# Patient Record
Sex: Female | Born: 1955 | Race: Asian | Hispanic: No | Marital: Married | Smoking: Never smoker
Health system: Southern US, Community
[De-identification: ages and names within clinical notes are randomized; demographics above are authoritative.]

## PROBLEM LIST (undated history)

## (undated) DIAGNOSIS — E119 Type 2 diabetes mellitus without complications: Secondary | ICD-10-CM

## (undated) DIAGNOSIS — M1990 Unspecified osteoarthritis, unspecified site: Secondary | ICD-10-CM

## (undated) DIAGNOSIS — R05 Cough: Secondary | ICD-10-CM

## (undated) DIAGNOSIS — Z860100 Personal history of colon polyps, unspecified: Secondary | ICD-10-CM

## (undated) DIAGNOSIS — I10 Essential (primary) hypertension: Secondary | ICD-10-CM

## (undated) DIAGNOSIS — H269 Unspecified cataract: Secondary | ICD-10-CM

## (undated) DIAGNOSIS — T464X5A Adverse effect of angiotensin-converting-enzyme inhibitors, initial encounter: Secondary | ICD-10-CM

## (undated) DIAGNOSIS — R058 Other specified cough: Secondary | ICD-10-CM

## (undated) DIAGNOSIS — M810 Age-related osteoporosis without current pathological fracture: Secondary | ICD-10-CM

## (undated) HISTORY — PX: BELPHAROPTOSIS REPAIR: SHX369

## (undated) HISTORY — DX: Type 2 diabetes mellitus without complications: E11.9

## (undated) HISTORY — PX: BREAST BIOPSY: SHX20

## (undated) HISTORY — DX: Personal history of colon polyps, unspecified: Z86.0100

## (undated) HISTORY — DX: Unspecified osteoarthritis, unspecified site: M19.90

## (undated) HISTORY — DX: Adverse effect of angiotensin-converting-enzyme inhibitors, initial encounter: T46.4X5A

## (undated) HISTORY — DX: Personal history of colonic polyps: Z86.010

## (undated) HISTORY — DX: Other specified cough: R05.8

## (undated) HISTORY — DX: Cough: R05

## (undated) HISTORY — DX: Unspecified cataract: H26.9

## (undated) HISTORY — PX: POLYPECTOMY: SHX149

## (undated) HISTORY — DX: Essential (primary) hypertension: I10

## (undated) HISTORY — PX: COLONOSCOPY: SHX174

---

## 2001-04-14 ENCOUNTER — Encounter: Admission: RE | Admit: 2001-04-14 | Discharge: 2001-04-14 | Payer: Self-pay | Admitting: *Deleted

## 2001-04-14 ENCOUNTER — Encounter: Payer: Self-pay | Admitting: *Deleted

## 2003-09-20 ENCOUNTER — Encounter: Admission: RE | Admit: 2003-09-20 | Discharge: 2003-09-20 | Payer: Self-pay | Admitting: Endocrinology

## 2003-10-25 ENCOUNTER — Other Ambulatory Visit: Admission: RE | Admit: 2003-10-25 | Discharge: 2003-10-25 | Payer: Self-pay | Admitting: Endocrinology

## 2004-03-06 ENCOUNTER — Ambulatory Visit: Payer: Self-pay | Admitting: Endocrinology

## 2004-03-27 ENCOUNTER — Ambulatory Visit (HOSPITAL_COMMUNITY): Admission: RE | Admit: 2004-03-27 | Discharge: 2004-03-27 | Payer: Self-pay | Admitting: Endocrinology

## 2004-05-01 ENCOUNTER — Ambulatory Visit (HOSPITAL_COMMUNITY): Admission: RE | Admit: 2004-05-01 | Discharge: 2004-05-01 | Payer: Self-pay | Admitting: Endocrinology

## 2004-07-14 ENCOUNTER — Ambulatory Visit: Payer: Self-pay | Admitting: Internal Medicine

## 2004-07-24 ENCOUNTER — Ambulatory Visit (HOSPITAL_COMMUNITY): Admission: RE | Admit: 2004-07-24 | Discharge: 2004-07-24 | Payer: Self-pay | Admitting: Internal Medicine

## 2004-07-31 ENCOUNTER — Encounter: Admission: RE | Admit: 2004-07-31 | Discharge: 2004-07-31 | Payer: Self-pay | Admitting: Internal Medicine

## 2004-08-07 ENCOUNTER — Ambulatory Visit (HOSPITAL_COMMUNITY): Admission: RE | Admit: 2004-08-07 | Discharge: 2004-08-07 | Payer: Self-pay | Admitting: Internal Medicine

## 2005-05-09 ENCOUNTER — Ambulatory Visit: Payer: Self-pay | Admitting: Endocrinology

## 2005-05-14 ENCOUNTER — Ambulatory Visit: Payer: Self-pay | Admitting: Endocrinology

## 2005-05-14 ENCOUNTER — Encounter: Payer: Self-pay | Admitting: Endocrinology

## 2005-05-14 ENCOUNTER — Other Ambulatory Visit: Admission: RE | Admit: 2005-05-14 | Discharge: 2005-05-14 | Payer: Self-pay | Admitting: Endocrinology

## 2005-06-04 ENCOUNTER — Encounter: Admission: RE | Admit: 2005-06-04 | Discharge: 2005-06-04 | Payer: Self-pay | Admitting: Endocrinology

## 2005-06-25 ENCOUNTER — Ambulatory Visit: Payer: Self-pay | Admitting: Endocrinology

## 2006-01-30 ENCOUNTER — Ambulatory Visit: Payer: Self-pay | Admitting: Endocrinology

## 2006-02-04 ENCOUNTER — Ambulatory Visit: Payer: Self-pay | Admitting: Pulmonary Disease

## 2006-03-11 ENCOUNTER — Ambulatory Visit: Payer: Self-pay | Admitting: Internal Medicine

## 2006-03-25 ENCOUNTER — Ambulatory Visit: Payer: Self-pay | Admitting: Internal Medicine

## 2006-04-16 HISTORY — PX: ESOPHAGOGASTRODUODENOSCOPY: SHX1529

## 2006-04-30 ENCOUNTER — Encounter (INDEPENDENT_AMBULATORY_CARE_PROVIDER_SITE_OTHER): Payer: Self-pay | Admitting: *Deleted

## 2006-04-30 ENCOUNTER — Ambulatory Visit: Payer: Self-pay | Admitting: Internal Medicine

## 2006-06-01 ENCOUNTER — Ambulatory Visit: Payer: Self-pay | Admitting: Family Medicine

## 2006-08-01 ENCOUNTER — Ambulatory Visit: Payer: Self-pay | Admitting: Internal Medicine

## 2006-09-15 DIAGNOSIS — H269 Unspecified cataract: Secondary | ICD-10-CM | POA: Insufficient documentation

## 2006-09-15 DIAGNOSIS — E119 Type 2 diabetes mellitus without complications: Secondary | ICD-10-CM

## 2006-09-15 HISTORY — DX: Type 2 diabetes mellitus without complications: E11.9

## 2006-09-19 ENCOUNTER — Ambulatory Visit: Payer: Self-pay | Admitting: Internal Medicine

## 2006-09-19 LAB — CONVERTED CEMR LAB
ALT: 21 units/L (ref 0–40)
AST: 22 units/L (ref 0–37)
Alkaline Phosphatase: 61 units/L (ref 39–117)
BUN: 21 mg/dL (ref 6–23)
Basophils Absolute: 0.1 10*3/uL (ref 0.0–0.1)
CO2: 27 meq/L (ref 19–32)
Calcium: 9.7 mg/dL (ref 8.4–10.5)
Creatinine, Ser: 0.8 mg/dL (ref 0.4–1.2)
Eosinophils Absolute: 0.3 10*3/uL (ref 0.0–0.6)
Eosinophils Relative: 3.1 % (ref 0.0–5.0)
HCT: 34.3 % — ABNORMAL LOW (ref 36.0–46.0)
Hemoglobin: 11.1 g/dL — ABNORMAL LOW (ref 12.0–15.0)
Hgb A1c MFr Bld: 7 % — ABNORMAL HIGH (ref 4.6–6.0)
Monocytes Absolute: 0.6 10*3/uL (ref 0.2–0.7)
Monocytes Relative: 7.6 % (ref 3.0–11.0)
Neutro Abs: 5.3 10*3/uL (ref 1.4–7.7)
Potassium: 3.8 meq/L (ref 3.5–5.1)
RDW: 19.7 % — ABNORMAL HIGH (ref 11.5–14.6)
Sed Rate: 25 mm/hr (ref 0–25)
TSH: 0.79 microintl units/mL (ref 0.35–5.50)
WBC: 8.3 10*3/uL (ref 4.5–10.5)

## 2006-10-11 ENCOUNTER — Ambulatory Visit: Payer: Self-pay | Admitting: Internal Medicine

## 2006-11-08 HISTORY — PX: CARDIOVASCULAR STRESS TEST: SHX262

## 2006-11-27 ENCOUNTER — Encounter: Payer: Self-pay | Admitting: Endocrinology

## 2006-11-27 DIAGNOSIS — Z860100 Personal history of colon polyps, unspecified: Secondary | ICD-10-CM | POA: Insufficient documentation

## 2006-11-27 DIAGNOSIS — E119 Type 2 diabetes mellitus without complications: Secondary | ICD-10-CM | POA: Insufficient documentation

## 2006-11-27 DIAGNOSIS — I10 Essential (primary) hypertension: Secondary | ICD-10-CM

## 2006-11-27 HISTORY — DX: Type 2 diabetes mellitus without complications: E11.9

## 2006-11-27 HISTORY — DX: Essential (primary) hypertension: I10

## 2006-12-17 ENCOUNTER — Ambulatory Visit: Payer: Self-pay | Admitting: Internal Medicine

## 2006-12-17 LAB — CONVERTED CEMR LAB
ALT: 16 units/L (ref 0–35)
CO2: 29 meq/L (ref 19–32)
Chloride: 107 meq/L (ref 96–112)
Cholesterol: 149 mg/dL (ref 0–200)
Direct LDL: 64.9 mg/dL
GFR calc Af Amer: 136 mL/min
GFR calc non Af Amer: 112 mL/min
Microalb, Ur: 2 mg/dL — ABNORMAL HIGH (ref 0.0–1.9)
Total CHOL/HDL Ratio: 3.7

## 2006-12-21 ENCOUNTER — Encounter: Admission: RE | Admit: 2006-12-21 | Discharge: 2006-12-21 | Payer: Self-pay | Admitting: Internal Medicine

## 2007-02-01 IMAGING — CT CT NECK W/ CM
1 of 4 series · 6 of 14 positions shown, 8 images · IV contrast (75ML OMNI 300)
Comparison: 09/20/03.

CLINICAL DATA: 48-year-old female, with posterior neck pain and history of lipoma.  
CT NECK WITH CONTRAST:
TECHNIQUE: 75 cc Omnipaque 300 contrast was administered intravenously with 2.5 mm multidetector axial imaging performed through the neck.

[Series 2: neck w/ · axial · 0.39mm/px · z∈[-227,-42]mm · 6 of 104 slices shown, 8 images]
[im 15/104  soft-tissue]
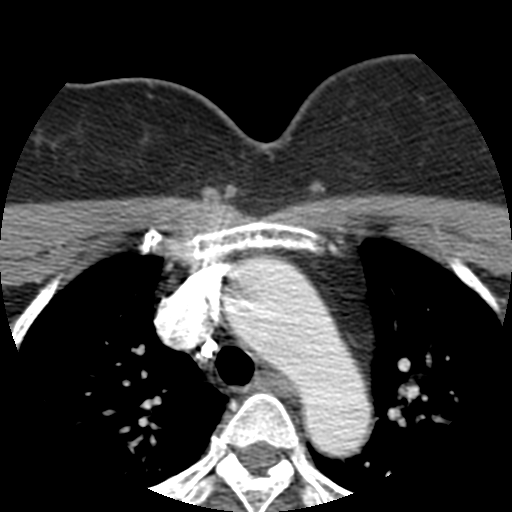
[im 15/104  bone]
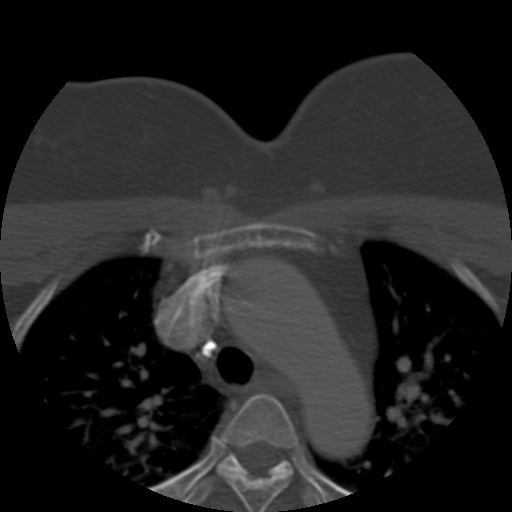
[im 30/104  bone]
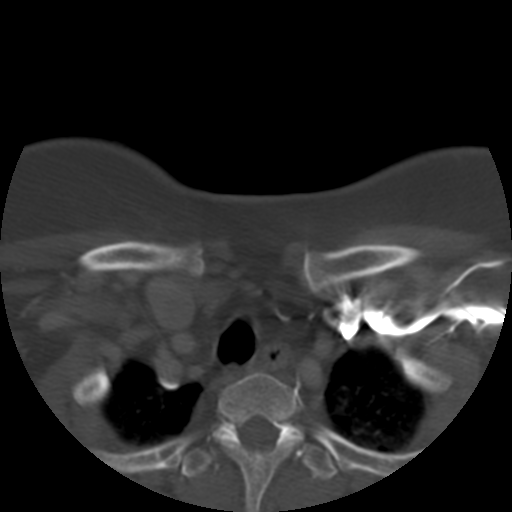
[im 45/104  bone]
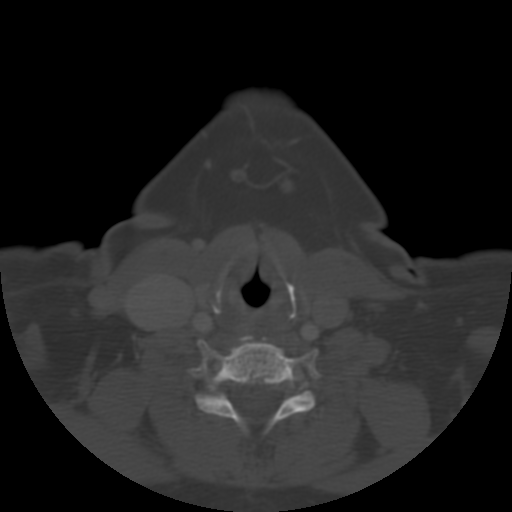
[im 59/104  bone]
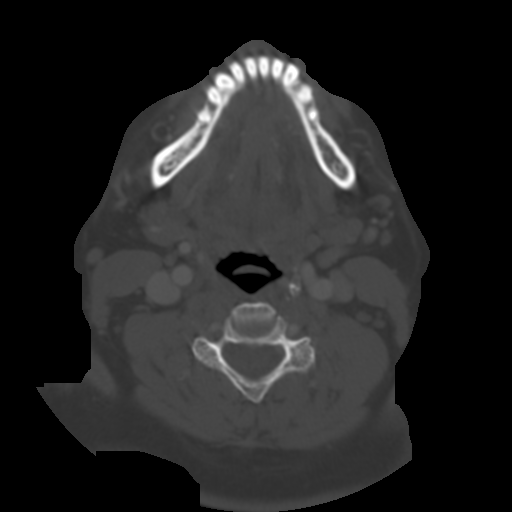
[im 74/104  soft-tissue]
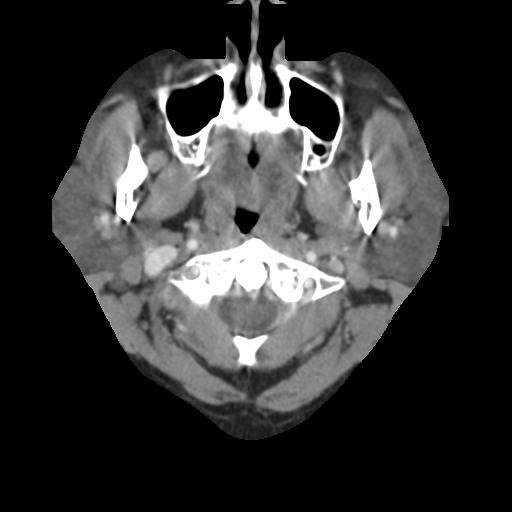
[im 74/104  bone]
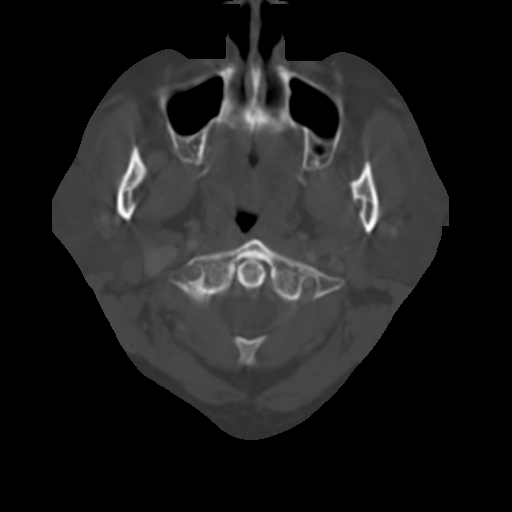
[im 89/104  bone]
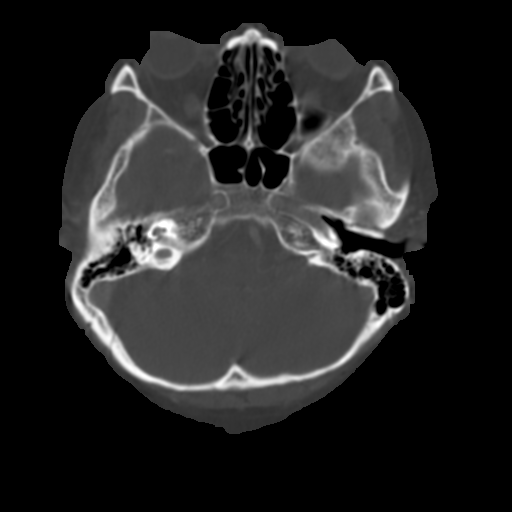

[6 of 14 positions shown; findings below may reference images not displayed]

FINDINGS: No discrete soft tissue mass is appreciated in the posterior neck.  There is asymmetry along the left posterior neck in the midcervical region involving the subcutaneous adipose layer but this appears to be related to the patient?s positioning and obesity.  There is mild prominence of the tonsillar tissue but symmetric.  The pharyngeal mucosal fat planes are preserved.  Salivary glands are symmetric.  Pharyngeal mucosal space is within normal limits.  No fluid collection or abscess.  No acute inflammation.  Scattered prominent cervical lymph nodes are present but no pathologically enlarged lymph nodes.  The carotid arteries are patent.  The jugular veins are also patent.  The thyroid gland is normal in size.  Imaging of the upper chest demonstrates calcified subcarinal lymph nodes consistent with granulomatous disease.  The lung apices demonstrate mild hypoventilatory changes diffusely.
IMPRESSION: No acute finding or discrete soft tissue mass.  No lymphadenopathy.  Stable exam.

## 2007-02-18 ENCOUNTER — Ambulatory Visit: Payer: Self-pay | Admitting: Internal Medicine

## 2007-02-18 DIAGNOSIS — E781 Pure hyperglyceridemia: Secondary | ICD-10-CM

## 2007-02-18 HISTORY — DX: Pure hyperglyceridemia: E78.1

## 2007-02-19 ENCOUNTER — Encounter (INDEPENDENT_AMBULATORY_CARE_PROVIDER_SITE_OTHER): Payer: Self-pay | Admitting: *Deleted

## 2007-03-20 ENCOUNTER — Encounter: Payer: Self-pay | Admitting: Internal Medicine

## 2007-04-01 ENCOUNTER — Encounter: Admission: RE | Admit: 2007-04-01 | Discharge: 2007-04-01 | Payer: Self-pay | Admitting: Internal Medicine

## 2007-04-21 LAB — CONVERTED CEMR LAB: Pap Smear: NORMAL

## 2007-04-29 ENCOUNTER — Ambulatory Visit: Payer: Self-pay | Admitting: Internal Medicine

## 2007-04-29 DIAGNOSIS — K5289 Other specified noninfective gastroenteritis and colitis: Secondary | ICD-10-CM | POA: Insufficient documentation

## 2007-05-07 LAB — CONVERTED CEMR LAB
Alkaline Phosphatase: 53 units/L (ref 39–117)
Basophils Absolute: 0 10*3/uL (ref 0.0–0.1)
Basophils Relative: 0 % (ref 0.0–1.0)
Bilirubin, Direct: 0.2 mg/dL (ref 0.0–0.3)
CO2: 30 meq/L (ref 19–32)
Chloride: 101 meq/L (ref 96–112)
Glucose, Bld: 109 mg/dL — ABNORMAL HIGH (ref 70–99)
Lipase: 20 units/L (ref 11.0–59.0)
MCV: 88.2 fL (ref 78.0–100.0)
Monocytes Relative: 9.4 % (ref 3.0–11.0)
Neutro Abs: 5.8 10*3/uL (ref 1.4–7.7)
Neutrophils Relative %: 71.1 % (ref 43.0–77.0)
Platelets: 231 10*3/uL (ref 150–400)
Potassium: 3.6 meq/L (ref 3.5–5.1)
RDW: 17.6 % — ABNORMAL HIGH (ref 11.5–14.6)
Sodium: 141 meq/L (ref 135–145)
WBC: 8.2 10*3/uL (ref 4.5–10.5)

## 2007-08-19 ENCOUNTER — Ambulatory Visit: Payer: Self-pay | Admitting: Internal Medicine

## 2007-10-27 ENCOUNTER — Ambulatory Visit: Payer: Self-pay | Admitting: Internal Medicine

## 2007-10-27 LAB — CONVERTED CEMR LAB
BUN: 12 mg/dL (ref 6–23)
Cholesterol: 168 mg/dL (ref 0–200)
Creatinine, Ser: 0.7 mg/dL (ref 0.4–1.2)
GFR calc Af Amer: 113 mL/min
GFR calc non Af Amer: 94 mL/min
Glucose, Bld: 91 mg/dL (ref 70–99)
Microalb Creat Ratio: 15.6 mg/g (ref 0.0–30.0)
Microalb, Ur: 2.1 mg/dL — ABNORMAL HIGH (ref 0.0–1.9)
Triglycerides: 169 mg/dL — ABNORMAL HIGH (ref 0–149)

## 2007-10-28 ENCOUNTER — Encounter: Payer: Self-pay | Admitting: Internal Medicine

## 2007-10-31 ENCOUNTER — Telehealth: Payer: Self-pay | Admitting: Internal Medicine

## 2007-12-02 ENCOUNTER — Ambulatory Visit: Payer: Self-pay | Admitting: Internal Medicine

## 2008-02-02 ENCOUNTER — Ambulatory Visit: Payer: Self-pay | Admitting: Internal Medicine

## 2008-02-02 LAB — CONVERTED CEMR LAB
ALT: 20 units/L (ref 0–35)
AST: 22 units/L (ref 0–37)
CO2: 26 meq/L (ref 19–32)
CRP, High Sensitivity: 1 (ref 0.00–5.00)
Calcium: 9.6 mg/dL (ref 8.4–10.5)
Chloride: 106 meq/L (ref 96–112)
Cholesterol: 175 mg/dL (ref 0–200)
GFR calc Af Amer: 136 mL/min
HDL: 55.1 mg/dL (ref >39.0)
LDL Cholesterol: 90 mg/dL (ref 0–99)
Potassium: 3.5 meq/L (ref 3.5–5.1)
Total CHOL/HDL Ratio: 3.2
VLDL: 30 mg/dL (ref 0–40)

## 2008-02-03 ENCOUNTER — Encounter: Payer: Self-pay | Admitting: Internal Medicine

## 2008-02-19 ENCOUNTER — Ambulatory Visit (HOSPITAL_BASED_OUTPATIENT_CLINIC_OR_DEPARTMENT_OTHER): Admission: RE | Admit: 2008-02-19 | Discharge: 2008-02-19 | Payer: Self-pay | Admitting: Internal Medicine

## 2008-02-19 ENCOUNTER — Ambulatory Visit: Payer: Self-pay | Admitting: Internal Medicine

## 2008-02-19 DIAGNOSIS — J189 Pneumonia, unspecified organism: Secondary | ICD-10-CM | POA: Insufficient documentation

## 2008-03-02 ENCOUNTER — Ambulatory Visit: Payer: Self-pay | Admitting: Internal Medicine

## 2008-04-29 ENCOUNTER — Ambulatory Visit: Payer: Self-pay | Admitting: Internal Medicine

## 2008-04-29 DIAGNOSIS — J00 Acute nasopharyngitis [common cold]: Secondary | ICD-10-CM | POA: Insufficient documentation

## 2008-06-07 ENCOUNTER — Telehealth: Payer: Self-pay | Admitting: Internal Medicine

## 2008-06-07 ENCOUNTER — Encounter: Payer: Self-pay | Admitting: Internal Medicine

## 2008-06-07 ENCOUNTER — Ambulatory Visit: Payer: Self-pay | Admitting: Diagnostic Radiology

## 2008-06-07 ENCOUNTER — Ambulatory Visit (HOSPITAL_BASED_OUTPATIENT_CLINIC_OR_DEPARTMENT_OTHER): Admission: RE | Admit: 2008-06-07 | Discharge: 2008-06-07 | Payer: Self-pay | Admitting: Internal Medicine

## 2008-10-11 ENCOUNTER — Ambulatory Visit: Payer: Self-pay | Admitting: Internal Medicine

## 2008-10-11 DIAGNOSIS — R109 Unspecified abdominal pain: Secondary | ICD-10-CM | POA: Insufficient documentation

## 2008-10-11 LAB — CONVERTED CEMR LAB
ALT: 15 units/L (ref 0–35)
AST: 16 units/L (ref 0–37)
Albumin: 4.7 g/dL (ref 3.5–5.2)
Alkaline Phosphatase: 54 units/L (ref 39–117)
Bilirubin, Direct: 0.1 mg/dL (ref 0.0–0.3)
Chloride: 107 meq/L (ref 96–112)
Microalb, Ur: 2.14 mg/dL — ABNORMAL HIGH (ref 0.00–1.89)

## 2008-10-12 ENCOUNTER — Encounter: Payer: Self-pay | Admitting: Internal Medicine

## 2008-10-19 ENCOUNTER — Ambulatory Visit: Payer: Self-pay | Admitting: Diagnostic Radiology

## 2008-10-19 ENCOUNTER — Telehealth: Payer: Self-pay | Admitting: Internal Medicine

## 2008-10-19 ENCOUNTER — Ambulatory Visit (HOSPITAL_BASED_OUTPATIENT_CLINIC_OR_DEPARTMENT_OTHER): Admission: RE | Admit: 2008-10-19 | Discharge: 2008-10-19 | Payer: Self-pay | Admitting: Internal Medicine

## 2008-12-06 ENCOUNTER — Ambulatory Visit: Payer: Self-pay | Admitting: Internal Medicine

## 2008-12-06 DIAGNOSIS — M779 Enthesopathy, unspecified: Secondary | ICD-10-CM | POA: Insufficient documentation

## 2008-12-06 DIAGNOSIS — L0291 Cutaneous abscess, unspecified: Secondary | ICD-10-CM | POA: Insufficient documentation

## 2008-12-06 DIAGNOSIS — L0390 Cellulitis, unspecified: Secondary | ICD-10-CM

## 2008-12-07 ENCOUNTER — Encounter: Payer: Self-pay | Admitting: Internal Medicine

## 2008-12-07 LAB — CONVERTED CEMR LAB
BUN: 23 mg/dL (ref 6–23)
CO2: 23 meq/L (ref 19–32)
Calcium: 9.5 mg/dL (ref 8.4–10.5)
Creatinine, Ser: 1.01 mg/dL (ref 0.40–1.20)
Glucose, Bld: 85 mg/dL (ref 70–99)
Hgb A1c MFr Bld: 5.8 % (ref 4.6–6.1)
Potassium: 3.8 meq/L (ref 3.5–5.3)

## 2008-12-13 ENCOUNTER — Ambulatory Visit (HOSPITAL_BASED_OUTPATIENT_CLINIC_OR_DEPARTMENT_OTHER): Admission: RE | Admit: 2008-12-13 | Discharge: 2008-12-13 | Payer: Self-pay | Admitting: Internal Medicine

## 2008-12-13 ENCOUNTER — Ambulatory Visit: Payer: Self-pay | Admitting: Diagnostic Radiology

## 2008-12-13 ENCOUNTER — Telehealth: Payer: Self-pay | Admitting: Internal Medicine

## 2009-01-03 ENCOUNTER — Ambulatory Visit: Payer: Self-pay | Admitting: Internal Medicine

## 2009-01-03 DIAGNOSIS — J309 Allergic rhinitis, unspecified: Secondary | ICD-10-CM

## 2009-01-03 HISTORY — DX: Allergic rhinitis, unspecified: J30.9

## 2009-01-17 ENCOUNTER — Telehealth: Payer: Self-pay | Admitting: Internal Medicine

## 2009-01-17 DIAGNOSIS — L259 Unspecified contact dermatitis, unspecified cause: Secondary | ICD-10-CM | POA: Insufficient documentation

## 2009-02-28 ENCOUNTER — Encounter: Payer: Self-pay | Admitting: Internal Medicine

## 2009-03-01 ENCOUNTER — Telehealth: Payer: Self-pay | Admitting: Internal Medicine

## 2009-03-28 ENCOUNTER — Encounter: Admission: RE | Admit: 2009-03-28 | Discharge: 2009-03-28 | Payer: Self-pay | Admitting: Internal Medicine

## 2009-07-07 ENCOUNTER — Ambulatory Visit: Payer: Self-pay | Admitting: Internal Medicine

## 2009-07-07 DIAGNOSIS — J328 Other chronic sinusitis: Secondary | ICD-10-CM

## 2009-07-07 HISTORY — DX: Other chronic sinusitis: J32.8

## 2009-07-07 LAB — CONVERTED CEMR LAB
BUN: 16 mg/dL (ref 6–23)
CO2: 25 meq/L (ref 19–32)
Calcium: 9.5 mg/dL (ref 8.4–10.5)
Creatinine, Ser: 0.97 mg/dL (ref 0.40–1.20)
Glucose, Bld: 115 mg/dL — ABNORMAL HIGH (ref 70–99)
Microalb, Ur: 2.89 mg/dL — ABNORMAL HIGH (ref 0.00–1.89)
Sodium: 145 meq/L (ref 135–145)

## 2009-07-08 ENCOUNTER — Encounter: Payer: Self-pay | Admitting: Internal Medicine

## 2009-10-11 ENCOUNTER — Ambulatory Visit (HOSPITAL_BASED_OUTPATIENT_CLINIC_OR_DEPARTMENT_OTHER): Admission: RE | Admit: 2009-10-11 | Discharge: 2009-10-11 | Payer: Self-pay | Admitting: Internal Medicine

## 2009-10-11 ENCOUNTER — Ambulatory Visit: Payer: Self-pay | Admitting: Diagnostic Radiology

## 2009-10-11 ENCOUNTER — Ambulatory Visit: Payer: Self-pay | Admitting: Internal Medicine

## 2009-10-11 DIAGNOSIS — J209 Acute bronchitis, unspecified: Secondary | ICD-10-CM | POA: Insufficient documentation

## 2009-10-11 LAB — CONVERTED CEMR LAB: Rapid Strep: NEGATIVE

## 2010-05-07 ENCOUNTER — Encounter: Payer: Self-pay | Admitting: Internal Medicine

## 2010-05-16 NOTE — Assessment & Plan Note (Signed)
Summary: fever, hot & cold/dt   Vital Signs:  Patient profile:   55 year old female Height:      62 inches Weight:      145.50 pounds BMI:     26.71 O2 Sat:      97 % on Room air Temp:     100.3 degrees F oral Pulse rate:   76 / minute Pulse rhythm:   regular Resp:     16 per minute BP sitting:   122 / 84  (right arm) Cuff size:   regular  Vitals Entered By: Glendell Docker CMA (October 11, 2009 1:18 PM)  O2 Flow:  Room air CC: Rm 3- Fever Comments medications reviewed,request refill on test strips and lancets, and Diovan, regimen completed for Cefuroxime, c/o chest congestion , throat irritation, and nasal drainage, non productive cough sudden onset yesterday. Took Dayquil and nightquil with relief   Primary Care Provider:  DThomos Lemons DO  CC:  Rm 3- Fever.  History of Present Illness: 55 y/o asian female flu like symptoms came home yest - got chills,  fever 101 sore throat,  runny nose onset 24-48 hrs   Allergies: 1)  ! Ace Inhibitors  Past History:  Past Medical History: Colonic polyps, hx of Diabetes mellitus, type II (09/2006) Hypertension   Cough due to Ace inhibitors         Past Surgical History: Caesarean section (1980 & 1982) EGD (04/30/2006)  Stress Cardiolite (11/08/2003)             Social History: Married Never Smoked  Alcohol use-no           Physical Exam  General:  alert, well-developed, and well-nourished.   Head:  normocephalic and atraumatic.   Ears:  R ear normal and L ear normal.   Mouth:  pharyngeal erythema.   Lungs:  slightly coarse breath sounds at basesnormal respiratory effort and no wheezes.   Heart:  normal rate, regular rhythm, and no gallop.     Impression & Recommendations:  Problem # 1:  ACUTE BRONCHITIS (ICD-466.0)  The following medications were removed from the medication list:    Cefuroxime Axetil 500 Mg Tabs (Cefuroxime axetil) ..... One by mouth two times a day Her updated medication list for this problem  includes:    Azithromycin 250 Mg Tabs (Azithromycin) .Marland Kitchen... 2 tabs on day one, then one by mouth once daily x 4 days  Orders: T-2 View CXR, Same Day (71020.5TC)  Take antibiotics and other medications as directed. Encouraged to push clear liquids, get enough rest, and take acetaminophen as needed. To be seen in 5-7 days if no improvement, sooner if worse.  Complete Medication List: 1)  Diovan Hct 80-12.5 Mg Tabs (Valsartan-hydrochlorothiazide) .... One by mouth qd 2)  Onetouch Test Strp (Glucose blood) .... Use to rest blood sugar two times a day 3)  Azelastine Hcl 137 Mcg/spray Soln (Azelastine hcl) .... 2 sprays each nostril once daily 4)  [facility] Loratadine 10 Mg Tabs (Loratadine) .... One by mouth once daily 5)  Nasonex 50 Mcg/act Susp (Mometasone furoate) .... 2 sprays each nostril once daily 6)  Azithromycin 250 Mg Tabs (Azithromycin) .... 2 tabs on day one, then one by mouth once daily x 4 days  Other Orders: Rapid Strep (81191)  Patient Instructions: 1)  Call our office if your symptoms do not  improve or gets worse. Prescriptions: DIOVAN HCT 80-12.5 MG TABS (VALSARTAN-HYDROCHLOROTHIAZIDE) one by mouth qd  #90 x 3  Entered and Authorized by:   D. Thomos Lemons DO   Signed by:   D. Thomos Lemons DO on 10/11/2009   Method used:   Faxed to ...       [facility])       [address] [address]       [place], [place]  [address]       Ph: [phone]       Fax: [phone]   RxID:   [id number] ONETOUCH TEST  STRP (GLUCOSE BLOOD) use to rest blood sugar two times a day  #100 x 3   Entered and Authorized by:   D. Thomos Lemons DO   Signed by:   D. Thomos Lemons DO on 10/11/2009   Method used:   Faxed to ...       [facility])       [address] [address]       , [place]  [address]       Ph: [phone]       Fax: [phone]   RxID:   [id number] AZITHROMYCIN 250 MG TABS (AZITHROMYCIN) 2 tabs on day one, then one by mouth once daily x 4 days  #6 x 0   Entered and  Authorized by:   D. Thomos Lemons DO   Signed by:   D. Thomos Lemons DO on 10/11/2009   Method used:   Electronically to        [id number] (retail)       [address]       [place], [place]  [address]       Ph: [phone] or [phone]       Fax: [phone]   RxID:   [id number]   Laboratory Results    Other Tests  Rapid Strep: negative  Kit Test Internal QC: Positive   (Normal Range: Negative)

## 2010-05-16 NOTE — Letter (Signed)
   [facility] [address] [place], [place]  [address]  [place] Phone: [phone]      July 08, 2009   [facility] [address] [place], [place] [address]  RE:  LAB RESULTS  Dear  Ms. Pupo,  The following is an interpretation of your most recent lab tests.  Please take note of any instructions provided or changes to medications that have resulted from your lab work.  ELECTROLYTES:  Good - no changes needed  KIDNEY FUNCTION TESTS:  Good - no changes needed    DIABETIC STUDIES:  Good - no changes needed Blood Glucose: 115   HgbA1C: 6.4   Microalbumin/Creatinine Ratio: 18.8     Please continue to avoid sweets and limit your carbohydrate intake to 30 grams per meal.  Daily walking program (30 minutes) can also help control your diabetes.    Please call my office for follow up appointment in 6 months.     Sincerely Yours,    Dr. Thomos Lemons

## 2010-05-16 NOTE — Assessment & Plan Note (Signed)
Summary: COUGH AND CONGESTION/[facility]   Vital Signs:  Patient profile:   55 year old female Height:      62 inches Weight:      146.25 pounds BMI:     26.85 O2 Sat:      98 % on Room air Temp:     97.4 degrees F oral Pulse rate:   79 / minute Pulse rhythm:   regular Resp:     20 per minute BP sitting:   122 / 72  (right arm) Cuff size:   regular  Vitals Entered By: Glendell Docker CMA (July 07, 2009 2:09 PM)  O2 Flow:  Room air CC: Rm 3-sinus congestion, URI symptoms   Primary Care Provider:  Dondra Spry DO  CC:  Rm 3-sinus congestion and URI symptoms.  History of Present Illness:  URI Symptoms      This is a 55 year old woman who presents with URI symptoms.  The patient reports nasal congestion, purulent nasal discharge, and dry cough.  The patient denies fever and low-grade fever (<100.5 degrees).  The patient also reports itchy throat.  onset of symptoms - 3 wks  Htn - stable  DM II - some wt gain.  fair diet control  Allergies: 1)  ! Ace Inhibitors  Past History:  Past Medical History: Colonic polyps, hx of Diabetes mellitus, type II (09/2006) Hypertension   Cough due to Ace inhibitors        Past Surgical History: Caesarean section (1980 & 1982) EGD (04/30/2006)  Stress Cardiolite (11/08/2003)         Social History: Married Never Smoked  Alcohol use-no          Review of Systems  The patient denies fever and chest pain.    Physical Exam  General:  alert, well-developed, and well-nourished.   Ears:  R ear normal and L ear normal.   Nose:  nasal dischargemucosal pallor and mucosal edema.   Mouth:  pharyngeal erythema.   Neck:  supple and no masses.   Lungs:  normal respiratory effort, normal breath sounds, no crackles, and no wheezes.   Heart:  normal rate, regular rhythm, and no gallop.   Extremities:  No lower extremity edema    Impression & Recommendations:  Problem # 1:  RHINOSINUSITIS, CHRONIC (ICD-473.8) 3 wks of nasal congestion with  purulent nasal discharge.  use abx and nose sprays as directed.  Call our office if your symptoms do not  improve or gets worse.  Her updated medication list for this problem includes:    Azelastine Hcl 137 Mcg/spray Soln (Azelastine hcl) .Marland Kitchen... 2 sprays each nostril once daily    Cefuroxime Axetil 500 Mg Tabs (Cefuroxime axetil) ..... One by mouth two times a day    Nasonex 50 Mcg/act Susp (Mometasone furoate) .Marland Kitchen... 2 sprays each nostril once daily  Problem # 2:  HYPERTENSION (ICD-401.9) well controlled.  Maintain current medication regimen.  Her updated medication list for this problem includes:    Diovan Hct 80-12.5 Mg Tabs (Valsartan-hydrochlorothiazide) ..... One by mouth qd  Orders: T-Basic Metabolic Panel [id number])  BP today: 122/72 Prior BP: 140/80 (01/03/2009)  Labs Reviewed: K+: 3.8 (12/07/2008) Creat: : 1.01 (12/07/2008)   Chol: 175 (02/02/2008)   HDL: 55.1 (02/02/2008)   LDL: 90 (02/02/2008)   TG: 150 (02/02/2008)  Problem # 3:  DIABETES MELLITUS, TYPE II (ICD-250.00) Monitor A1c Her updated medication list for this problem includes:    Diovan Hct 80-12.5 Mg Tabs (Valsartan-hydrochlorothiazide) .Marland KitchenMarland KitchenMarland KitchenMarland Kitchen  One by mouth qd  Orders: T- Hemoglobin A1C ([id number]) T-Urine Microalbumin w/creat. ratio [phone])  Complete Medication List: 1)  Diovan Hct 80-12.5 Mg Tabs (Valsartan-hydrochlorothiazide) .... One by mouth qd 2)  Onetouch Test Strp (Glucose blood) .... Use to rest blood sugar two times a day 3)  Azelastine Hcl 137 Mcg/spray Soln (Azelastine hcl) .... 2 sprays each nostril once daily 4)  Cefuroxime Axetil 500 Mg Tabs (Cefuroxime axetil) .... One by mouth two times a day 5)  [facility] Loratadine 10 Mg Tabs (Loratadine) .... One by mouth once daily 6)  Nasonex 50 Mcg/act Susp (Mometasone furoate) .... 2 sprays each nostril once daily  Patient Instructions: 1)  Call our office if your symptoms do not  improve or gets worse. 2)  Please schedule a follow-up  appointment in 6 months. Prescriptions: NASONEX 50 MCG/ACT SUSP (MOMETASONE FUROATE) 2 sprays each nostril once daily  #1 x 3   Entered and Authorized by:   D. Thomos Lemons DO   Signed by:   D. Thomos Lemons DO on 07/07/2009   Method used:   Electronically to        [id number] (retail)       [address]       [place], [place]  [address]       Ph: [phone] or [phone]       Fax: [phone]   RxID:   [id number] [facility] LORATADINE 10 MG TABS (LORATADINE) one by mouth once daily  #30 x 11   Entered and Authorized by:   D. Thomos Lemons DO   Signed by:   D. Thomos Lemons DO on 07/07/2009   Method used:   Electronically to        [id number] (retail)       [address]       [place], [place]  [address]       Ph: [phone] or [phone]       Fax: [phone]   RxID:   [id number] CEFUROXIME AXETIL 500 MG TABS (CEFUROXIME AXETIL) one by mouth two times a day  #20 x 0   Entered and Authorized by:   D. Thomos Lemons DO   Signed by:   D. Thomos Lemons DO on 07/07/2009   Method used:   Electronically to        [id number] (retail)       [address]       [place], [place]  [address]       Ph: [phone] or [phone]       Fax: [phone]   RxID:   [id number]   Current Allergies (reviewed today): ! ACE INHIBITORS

## 2010-05-25 ENCOUNTER — Telehealth: Payer: Self-pay | Admitting: Internal Medicine

## 2010-06-01 NOTE — Progress Notes (Signed)
Summary: Diovan Refill [facility]  Phone Note Call from Patient Call back at [phone]   Caller: Katherine Kelly Call For: Katherine Spry DO Reason for Call: Refill Medication Summary of Call: patients husband called and left voice message stating patient needs a [facility]ill [facility].  Initial call taken by: Glendell Docker CMA,  May 25, 2010 4:08 PM  Follow-up for Phone Call        Rx for Diovan faxed to [facility] at [phone]. Call placed to patients husband  at [phone] he has been informed rx faxed to [facility] Follow-up by: Glendell Docker CMA,  May 26, 2010 9:02 AM    Prescriptions: DIOVAN HCT 80-12.5 MG TABS (VALSARTAN-HYDROCHLOROTHIAZIDE) one by mouth qd  #90 x 1   Entered and Authorized by:   D. Thomos Lemons DO   Signed by:   D. Thomos Lemons DO on 05/25/2010   Method used:   Print then Give to Patient   RxID:   [id number]

## 2010-12-21 ENCOUNTER — Encounter: Payer: Self-pay | Admitting: Internal Medicine

## 2010-12-22 ENCOUNTER — Ambulatory Visit (INDEPENDENT_AMBULATORY_CARE_PROVIDER_SITE_OTHER): Payer: BC Managed Care – PPO | Admitting: Internal Medicine

## 2010-12-22 ENCOUNTER — Encounter: Payer: Self-pay | Admitting: Internal Medicine

## 2010-12-22 DIAGNOSIS — E119 Type 2 diabetes mellitus without complications: Secondary | ICD-10-CM

## 2010-12-22 DIAGNOSIS — Z0000 Encounter for general adult medical examination without abnormal findings: Secondary | ICD-10-CM

## 2010-12-22 DIAGNOSIS — Z1239 Encounter for other screening for malignant neoplasm of breast: Secondary | ICD-10-CM

## 2010-12-22 DIAGNOSIS — Z23 Encounter for immunization: Secondary | ICD-10-CM

## 2010-12-22 HISTORY — DX: Encounter for general adult medical examination without abnormal findings: Z00.00

## 2010-12-22 LAB — CBC
HCT: 44.9 % (ref 36.0–46.0)
Hemoglobin: 14.6 g/dL (ref 12.0–15.0)
MCV: 100.2 fL — ABNORMAL HIGH (ref 78.0–100.0)
RBC: 4.48 MIL/uL (ref 3.87–5.11)
WBC: 8.7 10*3/uL (ref 4.0–10.5)

## 2010-12-22 LAB — BASIC METABOLIC PANEL
BUN: 16 mg/dL (ref 6–23)
CO2: 27 mEq/L (ref 19–32)
Calcium: 10.4 mg/dL (ref 8.4–10.5)
Chloride: 104 mEq/L (ref 96–112)
Glucose, Bld: 104 mg/dL — ABNORMAL HIGH (ref 70–99)
Potassium: 4.7 mEq/L (ref 3.5–5.3)

## 2010-12-22 LAB — HEPATIC FUNCTION PANEL
ALT: 18 U/L (ref 0–35)
AST: 17 U/L (ref 0–37)
Albumin: 4.7 g/dL (ref 3.5–5.2)

## 2010-12-22 LAB — LIPID PANEL
Cholesterol: 223 mg/dL — ABNORMAL HIGH (ref 0–200)
HDL: 52 mg/dL (ref >39)
Total CHOL/HDL Ratio: 4.3 Ratio

## 2010-12-22 LAB — TSH: TSH: 2.322 u[IU]/mL (ref 0.350–4.500)

## 2010-12-22 NOTE — Assessment & Plan Note (Signed)
Nl exam. tdap given. Schedule screening mammogram. Obtain cpe labs. Schedule diabetic eye exam. Resume claritin prn for rhinitis.

## 2010-12-22 NOTE — Progress Notes (Signed)
  Subjective:    Patient ID: Katherine Kelly, female    DOB: 1955-04-21, 55 y.o.   MRN: [id number]  HPI Pt presents to clinic for annual exam. H/o DM not requiring medication currently. Due for diabetic eye exam, tetanus and screening mammogram. States pap smear 12/2009 and nl. Notes mild intermittent throat irritation. May have mild nasal drainage as well. No exacerbating or alleviating factors. No other complaints.  Past Medical History  Diagnosis Date  . History of colonic polyps   . Diabetes mellitus, type 2 09/2006  . Hypertension   . ACE-inhibitor cough    Past Surgical History  Procedure Date  . Cesarean section 1980 & 1982  . Esophagogastroduodenoscopy 04/2006  . Cardiovascular stress test 11/08/2006    reports that she has never smoked. She has never used smokeless tobacco. She reports that she does not drink alcohol or use illicit drugs. family history is not on file. Allergies  Allergen Reactions  . Ace Inhibitors        Review of Systems see hpi     Objective:   Physical Exam  Nursing note and vitals reviewed. Constitutional: She appears well-developed and well-nourished. No distress.  HENT:  Head: Normocephalic and atraumatic.  Right Ear: Tympanic membrane, external ear and ear canal normal.  Left Ear: Tympanic membrane, external ear and ear canal normal.  Nose: Nose normal.  Mouth/Throat: Oropharynx is clear and moist. No oropharyngeal exudate.       Mild R>L nasal mucosal swelling. Op with mild clear drainage.  Eyes: Conjunctivae and EOM are normal. Pupils are equal, round, and reactive to light. Right eye exhibits no discharge. Left eye exhibits no discharge. No scleral icterus.  Neck: Neck supple. Carotid bruit is not present. No thyromegaly present.  Cardiovascular: Normal rate, regular rhythm, normal heart sounds and intact distal pulses.  Exam reveals no gallop and no friction rub.   No murmur heard. Pulmonary/Chest: Effort normal and breath sounds normal. No  respiratory distress. She has no wheezes. She has no rales.  Abdominal: Soft. Normal appearance and bowel sounds are normal. She exhibits no distension and no mass. There is no hepatosplenomegaly. There is no tenderness. There is no rebound and no guarding.  Lymphadenopathy:    She has no cervical adenopathy.  Neurological: She is alert.  Skin: Skin is warm and dry. She is not diaphoretic.  Diabetic foot exam: +2 dp pulses bilaterally. No diabetic wounds, ulcerations or significant callousing. Sensation grossly intact.        Assessment & Plan:

## 2010-12-29 ENCOUNTER — Other Ambulatory Visit: Payer: Self-pay | Admitting: Internal Medicine

## 2010-12-29 DIAGNOSIS — E781 Pure hyperglyceridemia: Secondary | ICD-10-CM

## 2010-12-29 MED ORDER — FENOFIBRATE 145 MG PO TABS: 145.0000 mg | ORAL_TABLET | Freq: Every day | ORAL | Status: DC

## 2011-01-11 ENCOUNTER — Inpatient Hospital Stay (HOSPITAL_BASED_OUTPATIENT_CLINIC_OR_DEPARTMENT_OTHER): Admission: RE | Admit: 2011-01-11 | Payer: BC Managed Care – PPO | Source: Ambulatory Visit

## 2011-01-17 ENCOUNTER — Ambulatory Visit (HOSPITAL_BASED_OUTPATIENT_CLINIC_OR_DEPARTMENT_OTHER)
Admission: RE | Admit: 2011-01-17 | Discharge: 2011-01-17 | Disposition: A | Discharge status: Home / Self Care | Payer: BC Managed Care – PPO | Source: Ambulatory Visit | Attending: Internal Medicine | Admitting: Internal Medicine

## 2011-01-17 ENCOUNTER — Telehealth: Payer: Self-pay | Admitting: *Deleted

## 2011-01-17 DIAGNOSIS — Z1239 Encounter for other screening for malignant neoplasm of breast: Secondary | ICD-10-CM

## 2011-01-17 DIAGNOSIS — E781 Pure hyperglyceridemia: Secondary | ICD-10-CM

## 2011-01-17 DIAGNOSIS — Z1231 Encounter for screening mammogram for malignant neoplasm of breast: Secondary | ICD-10-CM | POA: Insufficient documentation

## 2011-01-17 MED ORDER — FENOFIBRATE 145 MG PO TABS: 145.0000 mg | ORAL_TABLET | Freq: Every day | ORAL | Status: DC

## 2011-01-17 NOTE — Telephone Encounter (Signed)
Patients husband called and left voice message stating insurance will not cover brand Tricor and request generic substitute be sent to pharmacy.  Call placed to patient at [phone], no answer. A detailed voice message was left informing patient of formulary change to generic. Message left advising to call back if any problems.

## 2011-01-24 ENCOUNTER — Encounter: Payer: Self-pay | Admitting: Internal Medicine

## 2011-01-24 ENCOUNTER — Ambulatory Visit (INDEPENDENT_AMBULATORY_CARE_PROVIDER_SITE_OTHER): Payer: BC Managed Care – PPO | Admitting: Internal Medicine

## 2011-01-24 DIAGNOSIS — H5789 Other specified disorders of eye and adnexa: Secondary | ICD-10-CM | POA: Insufficient documentation

## 2011-01-24 DIAGNOSIS — E781 Pure hyperglyceridemia: Secondary | ICD-10-CM

## 2011-01-24 DIAGNOSIS — H579 Unspecified disorder of eye and adnexa: Secondary | ICD-10-CM

## 2011-01-24 MED ORDER — FENOFIBRATE 160 MG PO TABS: 160.0000 mg | ORAL_TABLET | Freq: Every day | ORAL | Status: DC

## 2011-01-24 MED ORDER — VALSARTAN-HYDROCHLOROTHIAZIDE 80-12.5 MG PO TABS: 1.0000 | ORAL_TABLET | Freq: Every day | ORAL | Status: DC

## 2011-01-24 NOTE — Progress Notes (Signed)
  Subjective:    Patient ID: Katherine Kelly, female    DOB: Sep 12, 1955, 55 y.o.   MRN: [id number]  HPI Pt presents to clinic for evaluation of left eye irritation. Yesterday while removing makeup from her face accidentally rubbed piece of cotton against left eye. Flushed the eye at that time. Has noticed mild irritation and increased lacrimation but no change in visual acuity. No alleviating or exacerbating factors. Also h/o hyperlipidemia and recent prescribed fenofibrate but 145mg  tablet was too expensive. Has not yet attempted medication due to cost. No other complaints.   Past Medical History  Diagnosis Date  . History of colonic polyps   . Diabetes mellitus, type 2 09/2006  . Hypertension   . ACE-inhibitor cough    Past Surgical History  Procedure Date  . Cesarean section 1980 & 1982  . Esophagogastroduodenoscopy 04/2006  . Cardiovascular stress test 11/08/2006    reports that she has never smoked. She has never used smokeless tobacco. She reports that she does not drink alcohol or use illicit drugs. family history is not on file. Allergies  Allergen Reactions  . Ace Inhibitors        Review of Systems  Eyes: Positive for pain. Negative for photophobia, discharge, redness, itching and visual disturbance.   see hpi     Objective:   Physical Exam  Nursing note and vitals reviewed. Constitutional: She appears well-developed and well-nourished. No distress.  HENT:  Head: Normocephalic and atraumatic.  Right Ear: External ear normal.  Left Ear: External ear normal.  Eyes: EOM and lids are normal. Pupils are equal, round, and reactive to light. No foreign bodies found. Right eye exhibits no discharge and no exudate. No foreign body present in the right eye. Left eye exhibits no discharge and no exudate. No foreign body present in the left eye. Right conjunctiva is not injected. Right conjunctiva has no hemorrhage. Left conjunctiva is injected. Left conjunctiva has no hemorrhage. No  scleral icterus.  Skin: She is not diaphoretic.          Assessment & Plan:

## 2011-01-24 NOTE — Assessment & Plan Note (Signed)
Hx and exam suggest mild irritation due to foreign body. Recommend observation. If sx's do not improve or the next several days or worsen recommend optho consult.

## 2011-01-24 NOTE — Assessment & Plan Note (Signed)
Prescription reprinted for fenofibrate 160mg  qd to be priced at local pharmacy.

## 2011-04-22 IMAGING — US US ABDOMEN COMPLETE
1 series · 14 of 25 positions shown · non-contrast
Comparison: None

CLINICAL DATA: Epigastric pain for 2-3 months.

COMPLETE ABDOMINAL ULTRASOUND

[Series 1: us abdomen complete · 0.32mm/px · 14 of 66 slices shown]
[im 1/66]
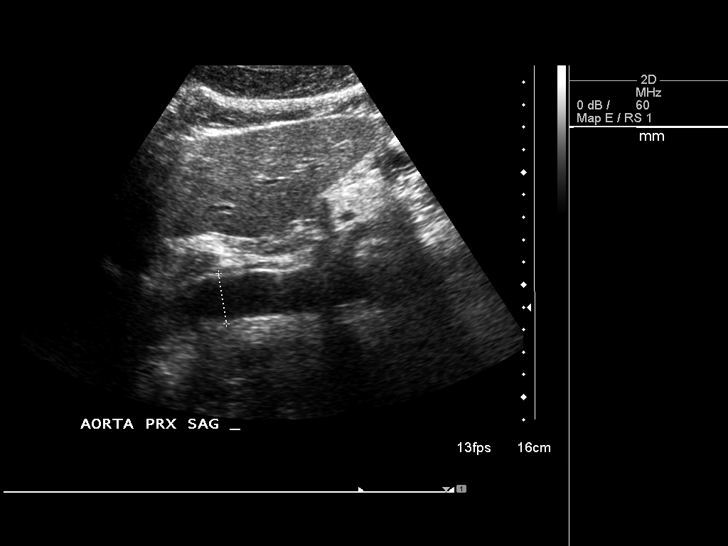
[im 6/66]
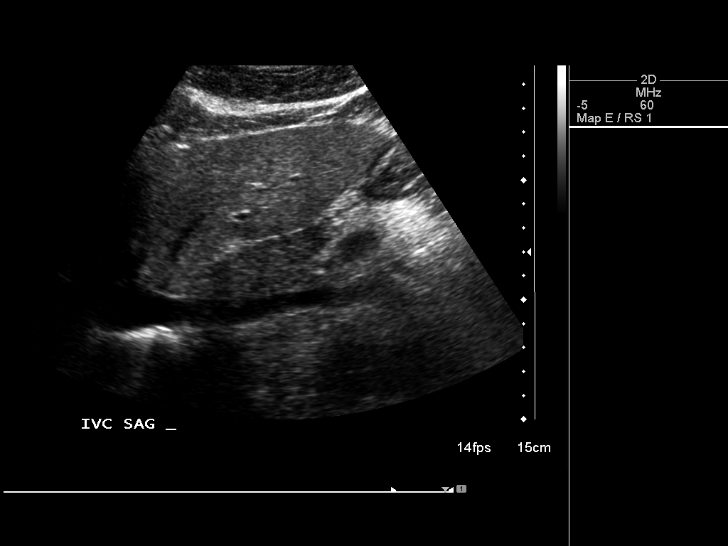
[im 11/66]
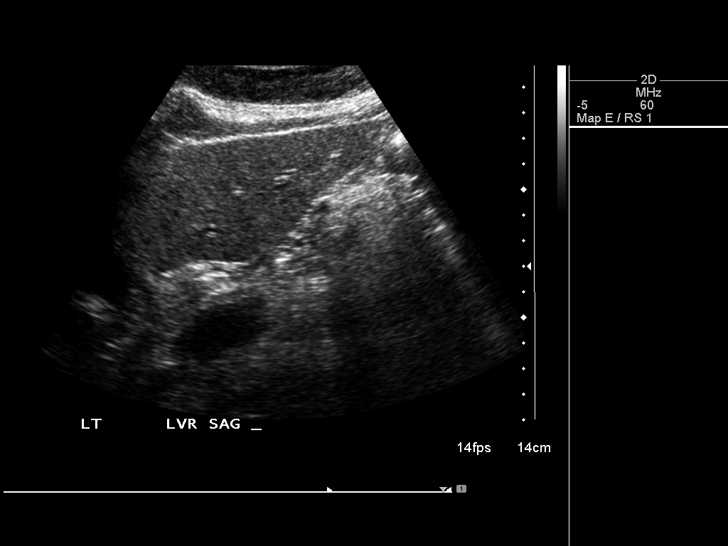
[im 17/66]
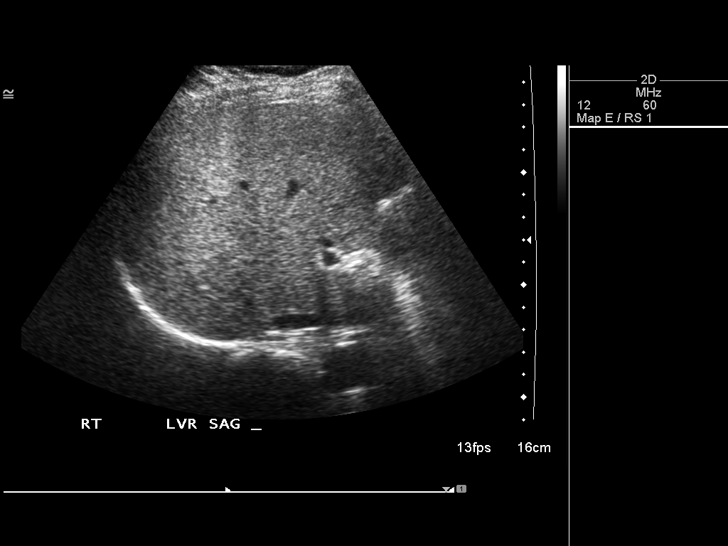
[im 22/66]
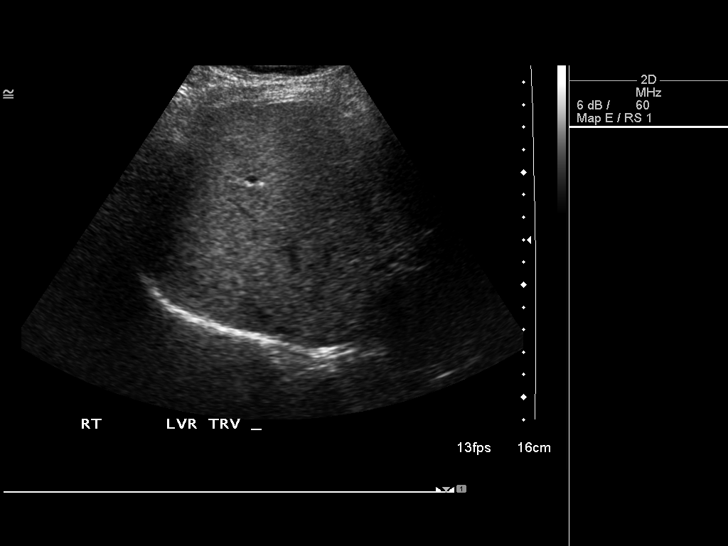
[im 25/66]
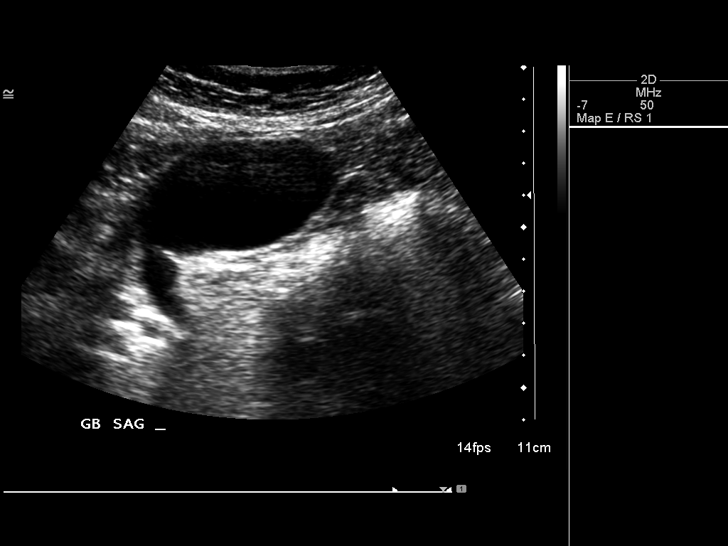
[im 30/66]
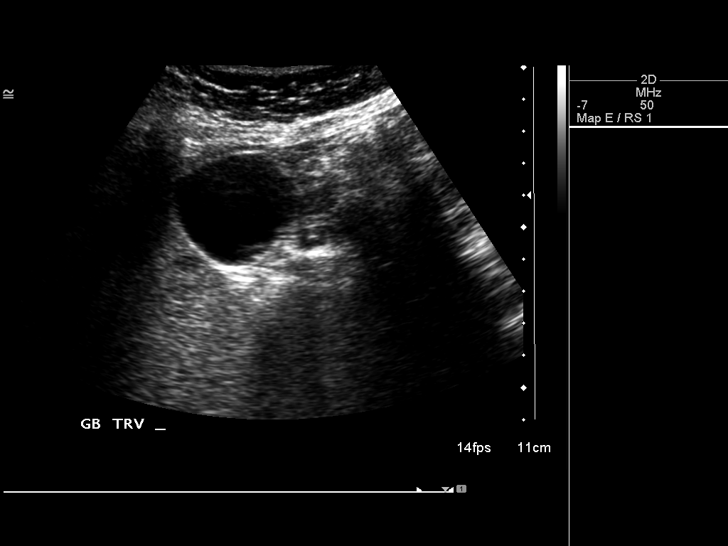
[im 36/66]
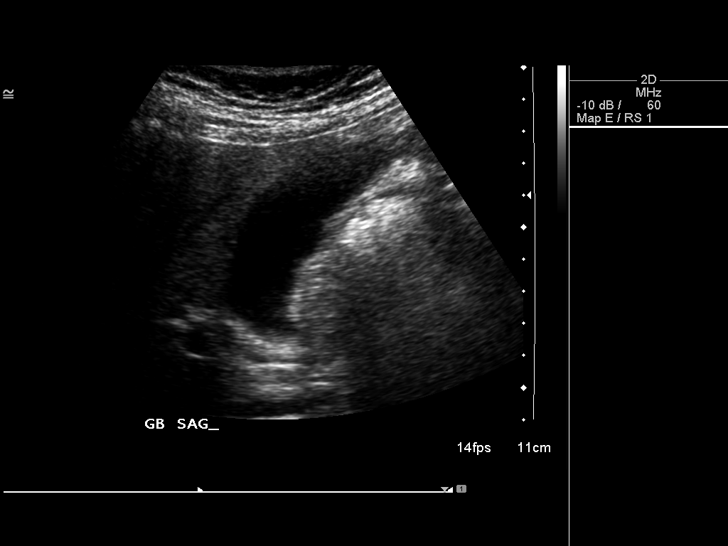
[im 41/66]
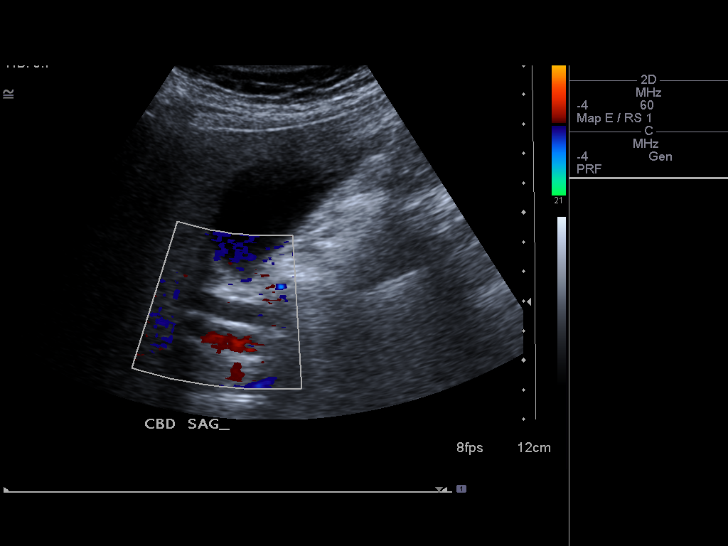
[im 44/66]
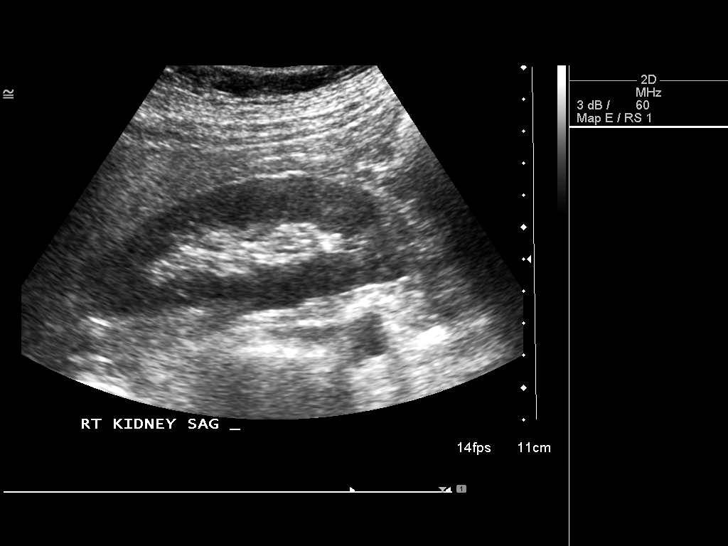
[im 49/66]
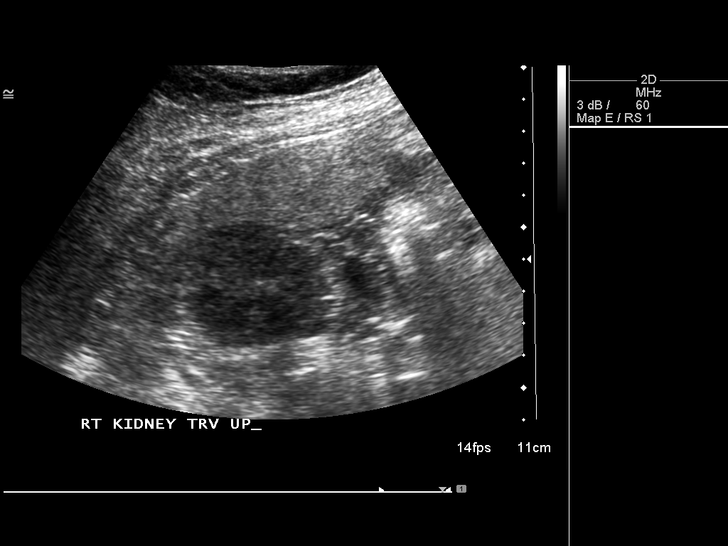
[im 55/66]
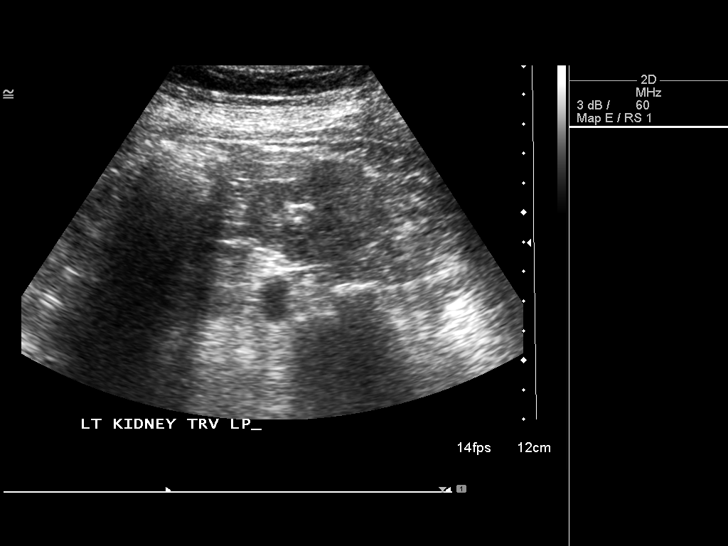
[im 60/66]
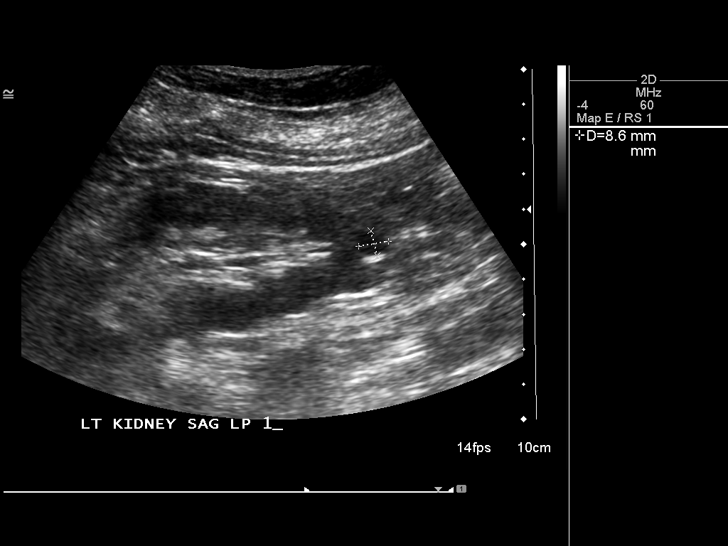
[im 66/66]
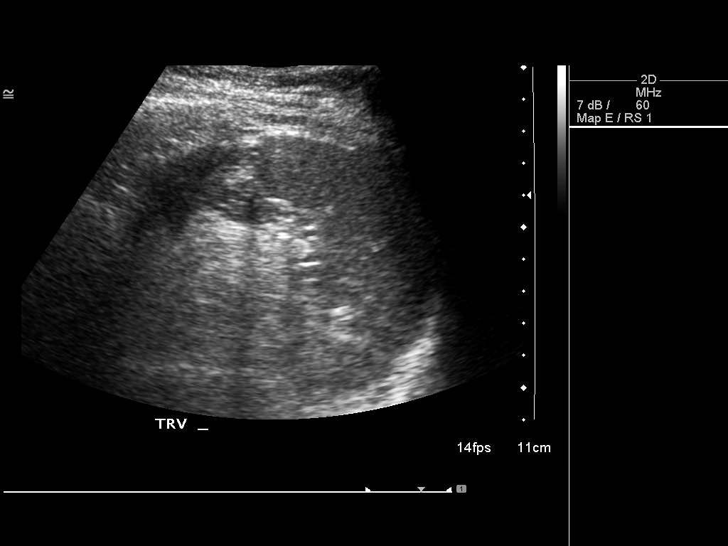

[14 of 25 positions shown; findings below may reference images not displayed]

FINDINGS: Gallbladder:  The gallbladder is moderately well seen and no
gallstones are noted.  There is no pain over the gallbladder upon
compression.

Common bile duct:  The common bile duct is normal measuring 5 mm in
diameter.

Liver:  The liver is echogenic consistent with fatty infiltration.
No ductal dilatation is seen.

IVC:  The IVC is partially obscured by bowel gas.

Pancreas:  The pancreas is echogenic suggesting fatty infiltration.

Spleen:  Spleen is normal in size.

Right Kidney:  No hydronephrosis is noted and the right kidney
measures 10.6 cm sagittally.

Left Kidney:  No hydronephrosis.  The left kidney measures 10.5 cm.
There is a small cyst in the lower pole of 9 mm in diameter.

Abdominal aorta:  The aorta is not well seen due to overlying bowel
gas.
IMPRESSION: 1.  No gallstones.  No ductal dilatation.
2.  Probable fatty infiltration of the liver.
3.  Some of the anatomy is obscured by overlying bowel gas.

## 2011-04-30 ENCOUNTER — Encounter: Payer: Self-pay | Admitting: Internal Medicine

## 2011-05-16 ENCOUNTER — Telehealth: Payer: Self-pay | Admitting: Internal Medicine

## 2011-05-16 MED ORDER — FENOFIBRATE 160 MG PO TABS: 160.0000 mg | ORAL_TABLET | Freq: Every day | ORAL | Status: DC

## 2011-05-16 NOTE — Telephone Encounter (Signed)
Patients husband states that patient is out of fenofibrate. Please send refill to [facility] [address]

## 2011-05-16 NOTE — Telephone Encounter (Signed)
Rx refill sent to pharmacy. 

## 2011-06-20 ENCOUNTER — Telehealth: Payer: Self-pay | Admitting: Internal Medicine

## 2011-06-20 ENCOUNTER — Encounter: Payer: Self-pay | Admitting: Internal Medicine

## 2011-06-20 ENCOUNTER — Ambulatory Visit (INDEPENDENT_AMBULATORY_CARE_PROVIDER_SITE_OTHER): Payer: BC Managed Care – PPO | Admitting: Internal Medicine

## 2011-06-20 ENCOUNTER — Other Ambulatory Visit: Payer: Self-pay | Admitting: *Deleted

## 2011-06-20 VITALS — BP 104/80 | HR 65 | Temp 97.6°F | Resp 16 | Ht 62.0 in | Wt 145.0 lb

## 2011-06-20 DIAGNOSIS — E785 Hyperlipidemia, unspecified: Secondary | ICD-10-CM

## 2011-06-20 DIAGNOSIS — I10 Essential (primary) hypertension: Secondary | ICD-10-CM

## 2011-06-20 DIAGNOSIS — E781 Pure hyperglyceridemia: Secondary | ICD-10-CM

## 2011-06-20 DIAGNOSIS — R739 Hyperglycemia, unspecified: Secondary | ICD-10-CM

## 2011-06-20 DIAGNOSIS — R7309 Other abnormal glucose: Secondary | ICD-10-CM

## 2011-06-20 DIAGNOSIS — E119 Type 2 diabetes mellitus without complications: Secondary | ICD-10-CM

## 2011-06-20 LAB — BASIC METABOLIC PANEL
CO2: 26 mEq/L (ref 19–32)
Chloride: 107 mEq/L (ref 96–112)
Creat: 0.82 mg/dL (ref 0.50–1.10)
Glucose, Bld: 99 mg/dL (ref 70–99)
Sodium: 144 mEq/L (ref 135–145)

## 2011-06-20 MED ORDER — FENOFIBRATE 160 MG PO TABS: 160.0000 mg | ORAL_TABLET | Freq: Every day | ORAL | Status: DC

## 2011-06-20 NOTE — Patient Instructions (Signed)
Please schedule lipid/lft 272.4, chem7, a1c-hyperglycemia prior to next visit

## 2011-06-21 LAB — LIPID PANEL
Cholesterol: 142 mg/dL (ref 0–200)
LDL Cholesterol: 55 mg/dL (ref 0–99)
Total CHOL/HDL Ratio: 2.4 Ratio
Triglycerides: 135 mg/dL (ref <150)
VLDL: 27 mg/dL (ref 0–40)

## 2011-06-21 LAB — HEPATIC FUNCTION PANEL
ALT: 45 U/L — ABNORMAL HIGH (ref 0–35)
Total Protein: 7.6 g/dL (ref 6.0–8.3)

## 2011-06-21 NOTE — Assessment & Plan Note (Signed)
Normotensive and stable. Continue current regimen. Monitor bp as outpt and followup in clinic as scheduled.  

## 2011-06-21 NOTE — Assessment & Plan Note (Signed)
Obtain lipid/lft. 

## 2011-06-21 NOTE — Assessment & Plan Note (Signed)
Obtain chem7, a1c 

## 2011-06-21 NOTE — Progress Notes (Signed)
  Subjective:    Patient ID: Katherine Kelly, female    DOB: 06-26-1955, 56 y.o.   MRN: [id number]  HPI Pt presents to clinic for followup of multiple medical problems. BP reviewed as normotensive. Wt stable. Tolerating fenofibrate without side effects. Compliant with medication. No active complaints.  Past Medical History  Diagnosis Date  . History of colonic polyps   . Diabetes mellitus, type 2 09/2006  . Hypertension   . ACE-inhibitor cough    Past Surgical History  Procedure Date  . Cesarean section 1980 & 1982  . Esophagogastroduodenoscopy 04/2006  . Cardiovascular stress test 11/08/2006    reports that she has never smoked. She has never used smokeless tobacco. She reports that she does not drink alcohol or use illicit drugs. family history is not on file. Allergies  Allergen Reactions  . Ace Inhibitors       Review of Systems see hpi     Objective:   Physical Exam  Physical Exam  Nursing note and vitals reviewed. Constitutional: Appears well-developed and well-nourished. No distress.  HENT:  Head: Normocephalic and atraumatic.  Right Ear: External ear normal.  Left Ear: External ear normal.  Eyes: Conjunctivae are normal. No scleral icterus.  Neck: Neck supple. Carotid bruit is not present.  Cardiovascular: Normal rate, regular rhythm and normal heart sounds.  Exam reveals no gallop and no friction rub.   No murmur heard. Pulmonary/Chest: Effort normal and breath sounds normal. No respiratory distress. He has no wheezes. no rales.  Lymphadenopathy:    He has no cervical adenopathy.  Neurological:Alert.  Skin: Skin is warm and dry. Not diaphoretic.  Psychiatric: Has a normal mood and affect.        Assessment & Plan:

## 2011-06-21 NOTE — Telephone Encounter (Signed)
Lab orders entered for July 2013. 

## 2011-07-25 ENCOUNTER — Ambulatory Visit (INDEPENDENT_AMBULATORY_CARE_PROVIDER_SITE_OTHER): Payer: BC Managed Care – PPO | Admitting: Family

## 2011-07-25 ENCOUNTER — Encounter: Payer: Self-pay | Admitting: Family

## 2011-07-25 VITALS — BP 112/62 | HR 77 | Temp 97.4°F | Resp 16 | Wt 147.1 lb

## 2011-07-25 DIAGNOSIS — J329 Chronic sinusitis, unspecified: Secondary | ICD-10-CM

## 2011-07-25 MED ORDER — HYDROCOD POLST-CHLORPHEN POLST 10-8 MG/5ML PO LQCR: 5.0000 mL | Freq: Two times a day (BID) | ORAL | Status: DC | PRN

## 2011-07-25 MED ORDER — AMOXICILLIN-POT CLAVULANATE 875-125 MG PO TABS: 1.0000 | ORAL_TABLET | Freq: Two times a day (BID) | ORAL | Status: AC

## 2011-07-25 NOTE — Patient Instructions (Signed)

## 2011-07-25 NOTE — Progress Notes (Signed)
  Subjective:    Patient ID: Katherine Kelly, female    DOB: 06/17/1955, 56 y.o.   MRN: [id number]  HPI  Katherine Kelly is a 56 yr old female who presents today with her husband (who helps to translate), with chief complaint of cough.  Cough started 2 weeks ago and is associated with HA, and nasal drainage. Cough is worst at night.   Multiple otc preps without improvement.   Pt denies associated fever, nausea or vomitting.    Review of Systems See HPI  Past Medical History  Diagnosis Date  . History of colonic polyps   . Diabetes mellitus, type 2 09/2006  . Hypertension   . ACE-inhibitor cough     History   Social History  . Marital Status: Married    Spouse Name: N/A    Number of Children: N/A  . Years of Education: N/A   Occupational History  . Not on file.   Social History Main Topics  . Smoking status: Never Smoker   . Smokeless tobacco: Never Used  . Alcohol Use: No  . Drug Use: No  . Sexually Active: Not on file   Other Topics Concern  . Not on file   Social History Narrative  . No narrative on file    Past Surgical History  Procedure Date  . Cesarean section 1980 & 1982  . Esophagogastroduodenoscopy 04/2006  . Cardiovascular stress test 11/08/2006    No family history on file.  Allergies  Allergen Reactions  . Ace Inhibitors     Current Outpatient Prescriptions on File Prior to Visit  Medication Sig Dispense Refill  . fenofibrate 160 MG tablet Take 1 tablet (160 mg total) by mouth daily.  90 tablet  1  . valsartan-hydrochlorothiazide (DIOVAN-HCT) 80-12.5 MG per tablet Take 1 tablet by mouth daily.  90 tablet  4    BP 112/62  Pulse 77  Temp(Src) 97.4 F (36.3 C) (Oral)  Resp 16  Wt 147 lb 1.3 oz (66.715 kg)  SpO2 97%       Objective:   Physical Exam  Constitutional: She appears well-developed and well-nourished.  HENT:  Head: Normocephalic and atraumatic.  Right Ear: Tympanic membrane and ear canal normal.  Left Ear: Tympanic membrane and  ear canal normal.  Mouth/Throat: No posterior oropharyngeal edema or posterior oropharyngeal erythema.  Cardiovascular: Normal rate and regular rhythm.   No murmur heard. Pulmonary/Chest: Effort normal and breath sounds normal. No respiratory distress. She has no wheezes. She has no rales. She exhibits no tenderness.          Assessment & Plan:

## 2011-07-27 DIAGNOSIS — J329 Chronic sinusitis, unspecified: Secondary | ICD-10-CM | POA: Insufficient documentation

## 2011-07-27 NOTE — Assessment & Plan Note (Signed)
Will rx with augmentin. Add tussionex for cough.  Pt advised re: possible sleepiness with tussionex and not to drive when using.

## 2011-11-21 ENCOUNTER — Ambulatory Visit: Payer: BC Managed Care – PPO | Admitting: Internal Medicine

## 2011-11-23 ENCOUNTER — Ambulatory Visit (INDEPENDENT_AMBULATORY_CARE_PROVIDER_SITE_OTHER): Payer: BC Managed Care – PPO | Admitting: Internal Medicine

## 2011-11-23 ENCOUNTER — Encounter: Payer: Self-pay | Admitting: Internal Medicine

## 2011-11-23 VITALS — BP 114/78 | HR 67 | Temp 97.8°F | Resp 14 | Ht 62.0 in | Wt 144.8 lb

## 2011-11-23 DIAGNOSIS — I10 Essential (primary) hypertension: Secondary | ICD-10-CM

## 2011-11-23 DIAGNOSIS — E119 Type 2 diabetes mellitus without complications: Secondary | ICD-10-CM

## 2011-11-23 DIAGNOSIS — J309 Allergic rhinitis, unspecified: Secondary | ICD-10-CM

## 2011-11-23 DIAGNOSIS — E785 Hyperlipidemia, unspecified: Secondary | ICD-10-CM

## 2011-11-23 DIAGNOSIS — E781 Pure hyperglyceridemia: Secondary | ICD-10-CM

## 2011-11-23 LAB — HEPATIC FUNCTION PANEL
ALT: 20 U/L (ref 0–35)
AST: 21 U/L (ref 0–37)
Alkaline Phosphatase: 45 U/L (ref 39–117)
Indirect Bilirubin: 0.4 mg/dL (ref 0.0–0.9)
Total Protein: 7.5 g/dL (ref 6.0–8.3)

## 2011-11-23 LAB — BASIC METABOLIC PANEL
BUN: 20 mg/dL (ref 6–23)
Calcium: 9.8 mg/dL (ref 8.4–10.5)
Creat: 0.91 mg/dL (ref 0.50–1.10)

## 2011-11-23 LAB — LIPID PANEL
Cholesterol: 152 mg/dL (ref 0–200)
HDL: 52 mg/dL (ref >39)
Triglycerides: 131 mg/dL (ref <150)

## 2011-11-23 LAB — HEMOGLOBIN A1C
Hgb A1c MFr Bld: 6 % — ABNORMAL HIGH (ref <5.7)
Mean Plasma Glucose: 126 mg/dL — ABNORMAL HIGH (ref <117)

## 2011-11-23 MED ORDER — FLUTICASONE PROPIONATE 50 MCG/ACT NA SUSP: 2.0000 | Freq: Every day | NASAL | Status: DC

## 2011-11-23 MED ORDER — VALSARTAN 80 MG PO TABS: 80.0000 mg | ORAL_TABLET | Freq: Every day | ORAL | Status: DC

## 2011-11-24 NOTE — Assessment & Plan Note (Signed)
Change diovan hct to diovan. Monitor bp.

## 2011-11-24 NOTE — Assessment & Plan Note (Signed)
Attempt flonase qd. Followup if no improvement or worsening.

## 2011-11-24 NOTE — Assessment & Plan Note (Signed)
Obtain lipid/lft. 

## 2011-11-24 NOTE — Assessment & Plan Note (Signed)
Obtain chem7 and a1c 

## 2011-11-24 NOTE — Progress Notes (Signed)
  Subjective:    Patient ID: [name], female    DOB: 1956/01/30, 56 y.o.   MRN: [id number]  HPI Pt presents to clinic for followup of multiple medical problems. Notes fatigue with bp medication. bp reviewed as normotensive. C/o nasal drainage without color or recent ifxn sx's.   Past Medical History  Diagnosis Date  . History of colonic polyps   . Diabetes mellitus, type 2 09/2006  . Hypertension   . ACE-inhibitor cough    Past Surgical History  Procedure Date  . Cesarean section 1980 & 1982  . Esophagogastroduodenoscopy 04/2006  . Cardiovascular stress test 11/08/2006    reports that she has never smoked. She has never used smokeless tobacco. She reports that she does not drink alcohol or use illicit drugs. family history is not on file. Allergies  Allergen Reactions  . Ace Inhibitors       Review of Systems see hpi     Objective:   Physical Exam  Physical Exam  Nursing note and vitals reviewed. Constitutional: Appears well-developed and well-nourished. No distress.  HENT:  Head: Normocephalic and atraumatic.  Right Ear: External ear normal.  Left Ear: External ear normal.  Eyes: Conjunctivae are normal. No scleral icterus.  Neck: Neck supple. Carotid bruit is not present.  Cardiovascular: Normal rate, regular rhythm and normal heart sounds.  Exam reveals no gallop and no friction rub.   No murmur heard. Pulmonary/Chest: Effort normal and breath sounds normal. No respiratory distress. He has no wheezes. no rales.  Lymphadenopathy:    He has no cervical adenopathy.  Neurological:Alert.  Skin: Skin is warm and dry. Not diaphoretic.  Psychiatric: Has a normal mood and affect.        Assessment & Plan:

## 2012-01-17 ENCOUNTER — Encounter: Payer: Self-pay | Admitting: Internal Medicine

## 2012-03-10 ENCOUNTER — Telehealth: Payer: Self-pay | Admitting: Internal Medicine

## 2012-03-10 NOTE — Telephone Encounter (Signed)
Informed caller that medication requested was sent to [facility] on 08.09.13 #90x2; pt should have [2] two available refills on Diovan/SLS

## 2012-03-10 NOTE — Telephone Encounter (Signed)
Patient's husband called  His wife   Needs refill on   Diovan 80mg  #90  Left message Friday please call pt's husband Katherine Kelly  [phone]

## 2012-04-16 HISTORY — PX: POLYPECTOMY: SHX149

## 2012-04-16 HISTORY — PX: COLONOSCOPY: SHX174

## 2012-07-22 ENCOUNTER — Telehealth: Payer: Self-pay | Admitting: Internal Medicine

## 2012-07-22 NOTE — Telephone Encounter (Signed)
Please advise/SLS

## 2012-07-22 NOTE — Telephone Encounter (Signed)
Patients daughter called in stating that patient would like to have a mammogram and colonscopy done before 08/14/12. Her insurance wont be affective after 08/14/12

## 2012-07-22 NOTE — Telephone Encounter (Signed)
They just call the [facility] to schedule own MGM and I can order referral for colonoscopy but it is a 2 step appt so it is hard to say if this will get done. Where do they want to go. [place], [place] or [place]

## 2012-07-23 NOTE — Telephone Encounter (Signed)
Receiving error message when attempting to call back pt at given number/SLS

## 2012-07-25 ENCOUNTER — Other Ambulatory Visit: Payer: Self-pay | Admitting: Family Medicine

## 2012-07-25 ENCOUNTER — Other Ambulatory Visit: Payer: Self-pay | Admitting: Family

## 2012-07-25 DIAGNOSIS — Z1211 Encounter for screening for malignant neoplasm of colon: Secondary | ICD-10-CM

## 2012-07-25 DIAGNOSIS — Z1231 Encounter for screening mammogram for malignant neoplasm of breast: Secondary | ICD-10-CM

## 2012-07-25 NOTE — Telephone Encounter (Signed)
Referral for colonoscopy done

## 2012-07-25 NOTE — Telephone Encounter (Signed)
Notified pt's daughter and she will call to schedule mammogram and wants to proceed with colonoscopy referral. She is aware that colonoscopy may not be completed before 08/14/12.  Please place referral to GI.

## 2012-07-30 ENCOUNTER — Encounter: Payer: Self-pay | Admitting: Gastroenterology

## 2012-07-30 ENCOUNTER — Ambulatory Visit (HOSPITAL_COMMUNITY)
Admission: RE | Admit: 2012-07-30 | Discharge: 2012-07-30 | Disposition: A | Discharge status: Home / Self Care | Payer: BC Managed Care – PPO | Source: Ambulatory Visit | Attending: Family | Admitting: Family

## 2012-07-30 DIAGNOSIS — Z1231 Encounter for screening mammogram for malignant neoplasm of breast: Secondary | ICD-10-CM

## 2012-09-04 ENCOUNTER — Encounter: Payer: BC Managed Care – PPO | Admitting: Gastroenterology

## 2012-09-05 ENCOUNTER — Ambulatory Visit (AMBULATORY_SURGERY_CENTER): Payer: BC Managed Care – PPO

## 2012-09-05 ENCOUNTER — Encounter: Payer: Self-pay | Admitting: Internal Medicine

## 2012-09-05 VITALS — Ht 62.0 in | Wt 143.2 lb

## 2012-09-05 DIAGNOSIS — Z86010 Personal history of colonic polyps: Secondary | ICD-10-CM

## 2012-09-05 DIAGNOSIS — Z1211 Encounter for screening for malignant neoplasm of colon: Secondary | ICD-10-CM

## 2012-09-05 MED ORDER — MOVIPREP 100 G PO SOLR: ORAL | Status: DC

## 2012-09-05 NOTE — Progress Notes (Signed)
The patient and her husband came into the office today for the pre-visit prior to the colonoscopy with Dr Marina Goodell on 09/09/12.The husband Clydene Laming) was the interpretor and will come with the pt to her colonoscopy appt on 09/09/12 to help with paperwork. The pt did understand some [place].

## 2012-09-09 ENCOUNTER — Ambulatory Visit (AMBULATORY_SURGERY_CENTER): Payer: BC Managed Care – PPO | Admitting: Internal Medicine

## 2012-09-09 ENCOUNTER — Encounter: Payer: Self-pay | Admitting: Internal Medicine

## 2012-09-09 VITALS — BP 142/86 | HR 64 | Temp 97.5°F | Resp 14

## 2012-09-09 DIAGNOSIS — D126 Benign neoplasm of colon, unspecified: Secondary | ICD-10-CM

## 2012-09-09 DIAGNOSIS — Z86010 Personal history of colonic polyps: Secondary | ICD-10-CM

## 2012-09-09 DIAGNOSIS — Z1211 Encounter for screening for malignant neoplasm of colon: Secondary | ICD-10-CM

## 2012-09-09 MED ORDER — SODIUM CHLORIDE 0.9 % IV SOLN: 500.0000 mL | INTRAVENOUS | Status: DC

## 2012-09-09 NOTE — Progress Notes (Signed)
NO EGG OR SOY ALLERGY. EWM 

## 2012-09-09 NOTE — Progress Notes (Signed)
Called to room to assist during endoscopic procedure.  Patient ID and intended procedure confirmed with present staff. Received instructions for my participation in the procedure from the performing physician.  

## 2012-09-09 NOTE — Patient Instructions (Addendum)
YOU HAD AN ENDOSCOPIC PROCEDURE TODAY AT THE [facility]: Refer to the procedure report that was given to you for any specific questions about what was found during the examination.  If the procedure report does not answer your questions, please call your gastroenterologist to clarify.  If you requested that your care partner not be given the details of your procedure findings, then the procedure report has been included in a sealed envelope for you to review at your convenience later.  YOU SHOULD EXPECT: Some feelings of bloating in the abdomen. Passage of more gas than usual.  Walking can help get rid of the air that was put into your GI tract during the procedure and reduce the bloating. If you had a lower endoscopy (such as a colonoscopy or flexible sigmoidoscopy) you may notice spotting of blood in your stool or on the toilet paper. If you underwent a bowel prep for your procedure, then you may not have a normal bowel movement for a few days.  DIET: Your first meal following the procedure should be a light meal and then it is ok to progress to your normal diet.  A half-sandwich or bowl of soup is an example of a good first meal.  Heavy or fried foods are harder to digest and may make you feel nauseous or bloated.  Likewise meals heavy in dairy and vegetables can cause extra gas to form and this can also increase the bloating.  Drink plenty of fluids but you should avoid alcoholic beverages for 24 hours.  ACTIVITY: Your care partner should take you home directly after the procedure.  You should plan to take it easy, moving slowly for the rest of the day.  You can resume normal activity the day after the procedure however you should NOT DRIVE or use heavy machinery for 24 hours (because of the sedation medicines used during the test).    SYMPTOMS TO REPORT IMMEDIATELY: A gastroenterologist can be reached at any hour.  During normal business hours, 8:30 AM to 5:00 PM Monday through Friday,  call [phone].  After hours and on weekends, please call the GI answering service at [phone] who will take a message and have the physician on call contact you.   Following lower endoscopy (colonoscopy or flexible sigmoidoscopy):  Excessive amounts of blood in the stool  Significant tenderness or worsening of abdominal pains  Swelling of the abdomen that is new, acute  Fever of 100F or higher   FOLLOW UP: If any biopsies were taken you will be contacted by phone or by letter within the next 1-3 weeks.  Call your gastroenterologist if you have not heard about the biopsies in 3 weeks.  Our staff will call the home number listed on your records the next business day following your procedure to check on you and address any questions or concerns that you may have at that time regarding the information given to you following your procedure. This is a courtesy call and so if there is no answer at the home number and we have not heard from you through the emergency physician on call, we will assume that you have returned to your regular daily activities without incident.  SIGNATURES/CONFIDENTIALITY: You and/or your care partner have signed paperwork which will be entered into your electronic medical record.  These signatures attest to the fact that that the information above on your After Visit Summary has been reviewed and is understood.  Full responsibility of the confidentiality of   this discharge information lies with you and/or your care-partner.  Follow up colonoscopy in 5 years 

## 2012-09-09 NOTE — Op Note (Signed)
[facility] [address] [place], [address]   COLONOSCOPY PROCEDURE REPORT  PATIENT: Katherine Kelly, Katherine Kelly  MR#: [id number] BIRTHDATE: [birth year] , 56  yrs. old GENDER: Female ENDOSCOPIST: Roxy Cedar, MD REFERRED [id number] Program Recall PROCEDURE DATE:  09/09/2012 PROCEDURE:   Colonoscopy with snare polypectomy  x 1  QUALITY INDICATORS:  First Colonoscopy? No.  Specimens Taken? Yes.  ASA CLASS:   Class II INDICATIONS:Patient's personal history of adenomatous colon polyps. Index 04-2006 w/ Multiple polyps (TAs and HPP) MEDICATIONS: MAC sedation, administered by CRNA and propofol (Diprivan) 300mg  IV  DESCRIPTION OF PROCEDURE:   After the risks benefits and alternatives of the procedure were thoroughly explained, informed consent was obtained.  A digital rectal exam revealed no abnormalities of the rectum.   The LB [id number]  endoscope was introduced through the anus and advanced to the cecum, which was identified by both the appendix and ileocecal valve. No adverse events experienced.   The quality of the prep was good, using MoviPrep  The instrument was then slowly withdrawn as the colon was fully examined.      COLON FINDINGS: A diminutive polyp was found in the ascending colon. A polypectomy was performed with a cold snare.  The resection was complete and the polyp tissue was completely retrieved.   The colon mucosa was otherwise normal.  Retroflexed views revealed no abnormalities. The time to cecum=2 minutes 33 seconds.  Withdrawal time=9 minutes 0 seconds.  The scope was withdrawn and the procedure completed. COMPLICATIONS: There were no complications.  ENDOSCOPIC IMPRESSION: 1.   Diminutive polyp was found in the ascending colon; polypectomy was performed with a cold snare 2.   The colon mucosa was otherwise normal  RECOMMENDATIONS: 1. Follow up colonoscopy in 5 years   eSigned:  Roxy Cedar, MD 09/09/2012 10:46 AM  cc:  Minus Breeding, MD and The Patient   PATIENT NAME:  Samanatha, Brammer MR#: [id number]

## 2012-09-10 ENCOUNTER — Telehealth: Payer: Self-pay

## 2012-09-10 NOTE — Telephone Encounter (Signed)
  Follow up Call-  Call back number 09/09/2012  Post procedure Call Back phone  # [phone]  Permission to leave phone message Yes     Patient questions:  Do you have a fever, pain , or abdominal swelling? no Pain Score  0 *  Have you tolerated food without any problems? yes  Have you been able to return to your normal activities? yes  Do you have any questions about your discharge instructions: Diet   no Medications  no Follow up visit  no  Do you have questions or concerns about your Care? no  Actions: * If pain score is 4 or above: No action needed, pain <4.

## 2012-09-15 ENCOUNTER — Encounter: Payer: Self-pay | Admitting: Internal Medicine

## 2012-09-16 ENCOUNTER — Other Ambulatory Visit: Payer: Self-pay | Admitting: Internal Medicine

## 2012-10-23 ENCOUNTER — Other Ambulatory Visit: Payer: Self-pay

## 2012-10-31 ENCOUNTER — Telehealth: Payer: Self-pay

## 2012-10-31 NOTE — Telephone Encounter (Signed)
Pts spouse left a message stating that pt needs her medication sent to [facility].  Didn't leave which medications on vm

## 2012-11-03 MED ORDER — ONETOUCH ULTRASOFT LANCETS MISC: Status: DC

## 2012-12-03 ENCOUNTER — Ambulatory Visit (INDEPENDENT_AMBULATORY_CARE_PROVIDER_SITE_OTHER): Payer: BC Managed Care – PPO | Admitting: Physician Assistant

## 2012-12-03 ENCOUNTER — Encounter: Payer: Self-pay | Admitting: Physician Assistant

## 2012-12-03 VITALS — BP 104/76 | HR 82 | Temp 98.3°F | Resp 14 | Ht 62.0 in | Wt 143.0 lb

## 2012-12-03 DIAGNOSIS — J069 Acute upper respiratory infection, unspecified: Secondary | ICD-10-CM

## 2012-12-03 NOTE — Progress Notes (Signed)
Patient ID: Katherine Kelly, female   DOB: [birth year], 57 y.o.   MRN: [id number]   Patient presents to clinic today c/o dry cough, head congestion x 4 days.  Information was obtained from the patient and her husband.  Endorses nasal congestion, sinus pressure.  Denies sinus pain, tooth pain or ear pain.  Endorses non-productive cough.  Endorses fever of 102 at night time.  No fever throughout the day.  Denies abdominal pain, N/V/C/D, or rash.  Denies history of asthma or allergy.  Denies shortness of breath or wheezing.  Her husband has been sick with similar symptoms for the past 4 days.  They just returned from a trip to the beach.  Past Medical History  Diagnosis Date  . History of colonic polyps   . Diabetes mellitus, type 2 09/2006    no meds  . Hypertension   . ACE-inhibitor cough     Current Outpatient Prescriptions on File Prior to Visit  Medication Sig Dispense Refill  . cholecalciferol (VITAMIN D) 1000 UNITS tablet Take 1,000 Units by mouth daily.      Marland Kitchen DIOVAN 80 MG tablet TAKE 1 TABLET DAILY  90 tablet  0  . fluticasone (FLONASE) 50 MCG/ACT nasal spray Place 2 sprays into the nose as needed.      . Lancets (ONETOUCH ULTRASOFT) lancets Check BS bid  DX 250.00  200 each  0   No current facility-administered medications on file prior to visit.    Allergies  Allergen Reactions  . Ace Inhibitors     weakness    Family History  Problem Relation Age of Onset  . Colon cancer Neg Hx   . Rectal cancer Neg Hx   . Stomach cancer Neg Hx     History   Social History  . Marital Status: Married    Spouse Name: N/A    Number of Children: N/A  . Years of Education: N/A   Social History Main Topics  . Smoking status: Never Smoker   . Smokeless tobacco: Never Used  . Alcohol Use: No  . Drug Use: No  . Sexual Activity: None   Other Topics Concern  . None   Social History Narrative  . None   Review of Systems  Constitutional: Positive for fever and chills. Negative for  weight loss and malaise/fatigue.  HENT: Positive for congestion and sore throat. Negative for ear pain.   Respiratory: Positive for cough. Negative for hemoptysis, sputum production, shortness of breath and wheezing.   Cardiovascular: Negative for chest pain and palpitations.  Gastrointestinal: Negative for nausea, vomiting, abdominal pain, diarrhea and constipation.  Musculoskeletal: Negative for myalgias.  Skin: Negative for rash.  Neurological: Negative for headaches.  Endo/Heme/Allergies: Negative for environmental allergies.   Filed Vitals:   12/03/12 1133  BP: 104/76  Pulse: 82  Temp: 98.3 F (36.8 C)  Resp: 14   Physical Exam  Vitals reviewed. Constitutional: She is oriented to person, place, and time and well-developed, well-nourished, and in no distress.  HENT:  Head: Normocephalic and atraumatic.  Right Ear: External ear normal.  Left Ear: External ear normal.  Nose: Nose normal.  Mouth/Throat: Oropharynx is clear and moist. No oropharyngeal exudate.  Eyes: Conjunctivae are normal. Pupils are equal, round, and reactive to light.  Neck: Normal range of motion. Neck supple.  Cardiovascular: Normal rate, regular rhythm, normal heart sounds and intact distal pulses.   Pulmonary/Chest: Effort normal and breath sounds normal. No respiratory distress. She has no wheezes. She has  no rales. She exhibits no tenderness.  Lymphadenopathy:    She has no cervical adenopathy.  Neurological: She is alert and oriented to person, place, and time.  Skin: Skin is warm and dry. No rash noted.   No results found for this or any previous visit (from the past 2160 hour(s)).  Assessment/Plan: No problem-specific assessment & plan notes found for this encounter.

## 2012-12-03 NOTE — Patient Instructions (Signed)
Please get plenty of rest and drink plenty of fluids.  Take a daily claritin.  Tylenol for fever.  If fever is 102 or greater and does not come down with tylenol, please call or return to office.  If symptoms persist or worsen, please return to office.  Viral Infections A viral infection can be caused by different types of viruses.Most viral infections are not serious and resolve on their own. However, some infections may cause severe symptoms and may lead to further complications. SYMPTOMS Viruses can frequently cause:  Minor sore throat.  Aches and pains.  Headaches.  Runny nose.  Different types of rashes.  Watery eyes.  Tiredness.  Cough.  Loss of appetite.  Gastrointestinal infections, resulting in nausea, vomiting, and diarrhea. These symptoms do not respond to antibiotics because the infection is not caused by bacteria. However, you might catch a bacterial infection following the viral infection. This is sometimes called a "superinfection." Symptoms of such a bacterial infection may include:  Worsening sore throat with pus and difficulty swallowing.  Swollen neck glands.  Chills and a high or persistent fever.  Severe headache.  Tenderness over the sinuses.  Persistent overall ill feeling (malaise), muscle aches, and tiredness (fatigue).  Persistent cough.  Yellow, green, or brown mucus production with coughing. HOME CARE INSTRUCTIONS   Only take over-the-counter or prescription medicines for pain, discomfort, diarrhea, or fever as directed by your caregiver.  Drink enough water and fluids to keep your urine clear or pale yellow. Sports drinks can provide valuable electrolytes, sugars, and hydration.  Get plenty of rest and maintain proper nutrition. Soups and broths with crackers or rice are fine. SEEK IMMEDIATE MEDICAL CARE IF:   You have severe headaches, shortness of breath, chest pain, neck pain, or an unusual rash.  You have uncontrolled vomiting,  diarrhea, or you are unable to keep down fluids.  You or your child has an oral temperature above 102 F (38.9 C), not controlled by medicine.  Your baby is older than 3 months with a rectal temperature of 102 F (38.9 C) or higher.  Your baby is 67 months old or younger with a rectal temperature of 100.4 F (38 C) or higher. MAKE SURE YOU:   Understand these instructions.  Will watch your condition.  Will get help right away if you are not doing well or get worse. Document Released: 01/10/2005 Document Revised: 06/25/2011 Document Reviewed: 08/07/2010 [facility] Patient Information 2014 [place], [place].

## 2012-12-03 NOTE — Assessment & Plan Note (Signed)
Rest.  Drink plenty of fluids.  Daily Claritin.  Saline nasal spray.  Warm liquids to sooth throat.  Tylenol for sore throat and/or fever.  If fever returns and persists despite taking Tylenol, return to clinic.

## 2013-01-30 ENCOUNTER — Ambulatory Visit (INDEPENDENT_AMBULATORY_CARE_PROVIDER_SITE_OTHER): Payer: BC Managed Care – PPO | Admitting: Family

## 2013-01-30 ENCOUNTER — Encounter: Payer: Self-pay | Admitting: Family

## 2013-01-30 ENCOUNTER — Other Ambulatory Visit (HOSPITAL_COMMUNITY)
Admission: RE | Admit: 2013-01-30 | Discharge: 2013-01-30 | Disposition: A | Discharge status: Home / Self Care | Payer: BC Managed Care – PPO | Source: Ambulatory Visit | Attending: Family | Admitting: Family

## 2013-01-30 VITALS — BP 116/80 | HR 76 | Temp 97.6°F | Resp 16 | Ht 61.0 in | Wt 142.1 lb

## 2013-01-30 DIAGNOSIS — Z23 Encounter for immunization: Secondary | ICD-10-CM

## 2013-01-30 DIAGNOSIS — E119 Type 2 diabetes mellitus without complications: Secondary | ICD-10-CM

## 2013-01-30 DIAGNOSIS — Z01419 Encounter for gynecological examination (general) (routine) without abnormal findings: Secondary | ICD-10-CM | POA: Insufficient documentation

## 2013-01-30 DIAGNOSIS — Z1151 Encounter for screening for human papillomavirus (HPV): Secondary | ICD-10-CM | POA: Insufficient documentation

## 2013-01-30 DIAGNOSIS — Z0000 Encounter for general adult medical examination without abnormal findings: Secondary | ICD-10-CM

## 2013-01-30 DIAGNOSIS — I10 Essential (primary) hypertension: Secondary | ICD-10-CM

## 2013-01-30 LAB — LIPID PANEL
HDL: 51 mg/dL (ref >39)
LDL Cholesterol: 107 mg/dL — ABNORMAL HIGH (ref 0–99)
Triglycerides: 276 mg/dL — ABNORMAL HIGH (ref <150)

## 2013-01-30 LAB — CBC WITH DIFFERENTIAL/PLATELET
Basophils Absolute: 0 10*3/uL (ref 0.0–0.1)
Eosinophils Relative: 3 % (ref 0–5)
Lymphocytes Relative: 22 % (ref 12–46)
MCV: 95 fL (ref 78.0–100.0)
Neutro Abs: 5.2 10*3/uL (ref 1.7–7.7)
Platelets: 206 10*3/uL (ref 150–400)
RDW: 13.8 % (ref 11.5–15.5)
WBC: 7.9 10*3/uL (ref 4.0–10.5)

## 2013-01-30 LAB — HEPATIC FUNCTION PANEL
Albumin: 4.6 g/dL (ref 3.5–5.2)
Indirect Bilirubin: 0.7 mg/dL (ref 0.0–0.9)
Total Protein: 7.4 g/dL (ref 6.0–8.3)

## 2013-01-30 LAB — BASIC METABOLIC PANEL WITH GFR
Calcium: 9.5 mg/dL (ref 8.4–10.5)
Creat: 0.77 mg/dL (ref 0.50–1.10)
GFR, Est African American: >89 mL/min
GFR, Est Non African American: 87 mL/min

## 2013-01-30 LAB — HEMOGLOBIN A1C: Mean Plasma Glucose: 134 mg/dL — ABNORMAL HIGH (ref <117)

## 2013-01-30 MED ORDER — VALSARTAN 80 MG PO TABS: ORAL_TABLET | ORAL | Status: DC

## 2013-01-30 NOTE — Assessment & Plan Note (Signed)
BP stable on diovan. Continue current dose.  BP Readings from Last 3 Encounters:  01/30/13 116/80  12/03/12 104/76  09/09/12 142/86

## 2013-01-30 NOTE — Progress Notes (Signed)
Subjective:    Patient ID: Katherine Kelly, female    DOB: 05-05-1955, 57 y.o.   MRN: [id number]  HPI  Ms. Katherine Kelly os a 57 yr old female who presents today for cpx.  Immunizations: due for flu shot. Tdap up to date, due for pneumovax Diet:  Reports diet is generally healthy.   Exercise:  Walks 30-40 minutes 3-4 days a week Colonoscopy: up to date Dexa: due,   Pap Smear: last pap on file 9/11, due for follow up Mammogram:  Up to date  DM2-  Last hemoglobin A1C on file was 8/13 and was 6.   Reports that her sugars are generally in the low 100's.    HTN-  She continues diovan 80mg .   Review of Systems  Constitutional: Negative for unexpected weight change.  HENT: Negative for rhinorrhea.   Eyes: Negative for visual disturbance.  Respiratory: Negative for cough.   Cardiovascular: Negative for chest pain and leg swelling.  Gastrointestinal: Negative for vomiting and abdominal pain.  Genitourinary: Negative for dysuria.  Musculoskeletal: Negative for arthralgias and myalgias.  Skin: Negative for rash.  Neurological: Negative for headaches.  Hematological: Negative for adenopathy.  Psychiatric/Behavioral:       Denies depression/anxiety     Past Medical History  Diagnosis Date  . History of colonic polyps   . Diabetes mellitus, type 2 09/2006    no meds  . Hypertension   . ACE-inhibitor cough     History   Social History  . Marital Status: Married    Spouse Name: N/A    Number of Children: N/A  . Years of Education: N/A   Occupational History  . Not on file.   Social History Main Topics  . Smoking status: Never Smoker   . Smokeless tobacco: Never Used  . Alcohol Use: No  . Drug Use: No  . Sexual Activity: Not on file   Other Topics Concern  . Not on file   Social History Narrative  . No narrative on file    Past Surgical History  Procedure Laterality Date  . Cesarean section  1980 & 1982  . Esophagogastroduodenoscopy  04/2006  . Cardiovascular stress  test  11/08/2006  . Colonoscopy    . Polypectomy      Family History  Problem Relation Age of Onset  . Colon cancer Neg Hx   . Rectal cancer Neg Hx   . Stomach cancer Neg Hx     Allergies  Allergen Reactions  . Ace Inhibitors     weakness    Current Outpatient Prescriptions on File Prior to Visit  Medication Sig Dispense Refill  . cholecalciferol (VITAMIN D) 1000 UNITS tablet Take 1,000 Units by mouth daily.      Marland Kitchen DIOVAN 80 MG tablet TAKE 1 TABLET DAILY  90 tablet  0  . fluticasone (FLONASE) 50 MCG/ACT nasal spray Place 2 sprays into the nose as needed.      . Lancets (ONETOUCH ULTRASOFT) lancets Check BS bid  DX 250.00  200 each  0   No current facility-administered medications on file prior to visit.    BP 116/80  Pulse 76  Temp(Src) 97.6 F (36.4 C) (Oral)  Resp 16  Ht 5\' 1"  (1.549 m)  Wt 142 lb 2.1 oz (64.47 kg)  BMI 26.87 kg/m2  SpO2 99%       Objective:   Physical Exam Physical Exam  Constitutional: She is oriented to person, place, and time. She appears well-developed and well-nourished. No  distress.  HENT:  Head: Normocephalic and atraumatic.  Right Ear: Tympanic membrane and ear canal normal.  Left Ear: Tympanic membrane and ear canal normal.  Mouth/Throat: Oropharynx is clear and moist.  Eyes: Pupils are equal, round, and reactive to light. No scleral icterus.  Neck: Normal range of motion. No thyromegaly present.  Cardiovascular: Normal rate and regular rhythm.   No murmur heard. Pulmonary/Chest: Effort normal and breath sounds normal. No respiratory distress. He has no wheezes. She has no rales. She exhibits no tenderness.  Abdominal: Soft. Bowel sounds are normal. He exhibits no distension and no mass. There is no tenderness. There is no rebound and no guarding.  Musculoskeletal: She exhibits no edema.  Lymphadenopathy:    She has no cervical adenopathy.  Neurological: She is alert and oriented to person, place, and time.  She exhibits normal  muscle tone. Coordination normal.  Skin: Skin is warm and dry. bruise noted on left upper arm Psychiatric: She has a normal mood and affect. Her behavior is normal. Judgment and thought content normal.  Breasts: Examined lying Right: Without masses, retractions, discharge or axillary adenopathy.  Left: Without masses, retractions, discharge or axillary adenopathy.  Inguinal/mons: Normal without inguinal adenopathy  External genitalia: Normal  BUS/Urethra/Skene's glands: Normal  Bladder: Normal  Vagina: Normal  Cervix: Normal  Uterus: normal in size, shape and contour. Midline and mobile  Adnexa/parametria:  Rt: Without masses or tenderness.  Lt: Without masses or tenderness.  Anus and perineum: Normal           Assessment & Plan:       Assessment & Plan:

## 2013-01-30 NOTE — Patient Instructions (Signed)
Please complete lab work prior to leaving. Start a baby aspirin 81mg  once daily to help prevent heart attack and stroke. You will be contacted about your referral for eye exam. Follow up in 3 months.

## 2013-01-30 NOTE — Assessment & Plan Note (Signed)
Check A1C, urine microalbumin, refer for DM eye.  Add aspirin 81mg  once dailyl. Continue healthy diet, regular exercise.

## 2013-01-30 NOTE — Assessment & Plan Note (Addendum)
Pap performed today. Mammo, colo up to date. Will obtain baseline ekg, fasting labs, refer for dexa, flu shot today.  Plan pneumovax next visit.

## 2013-01-31 LAB — MICROALBUMIN / CREATININE URINE RATIO
Creatinine, Urine: 141.6 mg/dL
Microalb Creat Ratio: 91.7 mg/g — ABNORMAL HIGH (ref 0.0–30.0)
Microalb, Ur: 12.99 mg/dL — ABNORMAL HIGH (ref 0.00–1.89)

## 2013-01-31 LAB — URINALYSIS, ROUTINE W REFLEX MICROSCOPIC
Bilirubin Urine: NEGATIVE
Ketones, ur: NEGATIVE mg/dL
Specific Gravity, Urine: 1.018 (ref 1.005–1.030)
pH: 5.5 (ref 5.0–8.0)

## 2013-01-31 LAB — URINALYSIS, MICROSCOPIC ONLY: Squamous Epithelial / LPF: NONE SEEN

## 2013-02-02 ENCOUNTER — Telehealth: Payer: Self-pay | Admitting: Family

## 2013-02-02 ENCOUNTER — Encounter: Payer: Self-pay | Admitting: Family

## 2013-02-02 DIAGNOSIS — E781 Pure hyperglyceridemia: Secondary | ICD-10-CM

## 2013-02-02 MED ORDER — SIMVASTATIN 10 MG PO TABS: 10.0000 mg | ORAL_TABLET | Freq: Every day | ORAL | Status: DC

## 2013-02-02 NOTE — Telephone Encounter (Signed)
Also, please let pt know that pap smear is normal and should be repeated in 5 years.

## 2013-02-02 NOTE — Telephone Encounter (Signed)
Left message at home # to return my call. 

## 2013-02-02 NOTE — Telephone Encounter (Signed)
Sugar is well controlled.  Cholesterol slightlyy above goal.  Add simvastatin 10 mg once daily. Repeat flp/lft in 3 months.  Call if unusual muscle pain occurs while taking.  Urine shows possible UTI.  If symptomatic, I would like her to return to the lab for urine culture please.

## 2013-02-03 NOTE — Telephone Encounter (Signed)
Left message on home # to return my call and mailed letter of results. Lab order entered.

## 2013-03-10 ENCOUNTER — Telehealth: Payer: Self-pay | Admitting: Family

## 2013-03-10 NOTE — Telephone Encounter (Signed)
The diovan she takes is a $270 copay.  Can you prescribe something to take its place

## 2013-03-11 MED ORDER — LOSARTAN POTASSIUM 50 MG PO TABS: 50.0000 mg | ORAL_TABLET | Freq: Every day | ORAL | Status: DC

## 2013-03-11 NOTE — Telephone Encounter (Signed)
Left message for pt to return my call.

## 2013-03-11 NOTE — Telephone Encounter (Signed)
Rx sent for losartan, hopefully this will be cheaper. Follow up in 2 weeks for BP check and BMET.  This can be a nurse visit.

## 2013-03-16 NOTE — Telephone Encounter (Signed)
Notified pt's daughter. Nurse visit and lab scheduled for 04/01/13 at 11am.

## 2013-05-01 ENCOUNTER — Ambulatory Visit: Payer: BC Managed Care – PPO | Admitting: Family

## 2013-05-01 DIAGNOSIS — Z0289 Encounter for other administrative examinations: Secondary | ICD-10-CM

## 2013-06-19 ENCOUNTER — Ambulatory Visit (INDEPENDENT_AMBULATORY_CARE_PROVIDER_SITE_OTHER): Payer: BC Managed Care – PPO | Admitting: Family

## 2013-06-19 ENCOUNTER — Encounter: Payer: Self-pay | Admitting: Family

## 2013-06-19 VITALS — BP 108/72 | HR 79 | Temp 97.6°F | Resp 16 | Ht 61.0 in

## 2013-06-19 DIAGNOSIS — I10 Essential (primary) hypertension: Secondary | ICD-10-CM

## 2013-06-19 DIAGNOSIS — J069 Acute upper respiratory infection, unspecified: Secondary | ICD-10-CM

## 2013-06-19 DIAGNOSIS — R21 Rash and other nonspecific skin eruption: Secondary | ICD-10-CM

## 2013-06-19 MED ORDER — VALSARTAN 80 MG PO TABS: 80.0000 mg | ORAL_TABLET | Freq: Every day | ORAL | Status: DC

## 2013-06-19 NOTE — Progress Notes (Signed)
Subjective:    Patient ID: Katherine Kelly, female    DOB: 1955-05-25, 58 y.o.   MRN: [id number]  HPI  Katherine Kelly is a 58 yr old female who presents today with several concerns:  1) URI- reports 3 week hx of cough.  Symptoms had nearly resolved until yesterday when she developed fever and  chills. Symptoms seemed better until yesterday, but she feels weak.  Denies sore throat or ear pain.   2) Rash- 3 month hx of itchy red rash on L lower leg.  Has tried betamethasone and clobetasol without improvement.   3) HTN- has not started losartan yet, she is completing diovan.     Review of Systems See HPI  Past Medical History  Diagnosis Date  . History of colonic polyps   . Diabetes mellitus, type 2 09/2006    no meds  . Hypertension   . ACE-inhibitor cough     History   Social History  . Marital Status: Married    Spouse Name: N/A    Number of Children: N/A  . Years of Education: N/A   Occupational History  . Not on file.   Social History Main Topics  . Smoking status: Never Smoker   . Smokeless tobacco: Never Used  . Alcohol Use: No  . Drug Use: No  . Sexual Activity: Not on file   Other Topics Concern  . Not on file   Social History Narrative   Lives with husband and daughter   Freight forwarder at [employer]   No pets   Did not complete high school   Enjoys travel and shopping    Past Surgical History  Procedure Laterality Date  . Cesarean section  [place]  . Esophagogastroduodenoscopy  04/2006  . Cardiovascular stress test  11/08/2006  . Colonoscopy    . Polypectomy      Family History  Problem Relation Age of Onset  . Colon cancer Neg Hx   . Rectal cancer Neg Hx   . Stomach cancer Neg Hx     Allergies  Allergen Reactions  . Ace Inhibitors     weakness    Current Outpatient Prescriptions on File Prior to Visit  Medication Sig Dispense Refill  . aspirin EC 81 MG tablet Take 81 mg by mouth daily.      . cholecalciferol (VITAMIN D) 1000 UNITS  tablet Take 1,000 Units by mouth daily.      . fluticasone (FLONASE) 50 MCG/ACT nasal spray Place 2 sprays into the nose as needed.      . Lancets (ONETOUCH ULTRASOFT) lancets Check BS bid  DX 250.00  200 each  0  . simvastatin (ZOCOR) 10 MG tablet Take 1 tablet (10 mg total) by mouth at bedtime.  30 tablet  2   No current facility-administered medications on file prior to visit.    BP 108/72  Pulse 79  Temp(Src) 97.6 F (36.4 C) (Oral)  Resp 16  Ht 5\' 1"  (1.549 m)  SpO2 97%       Objective:   Physical Exam  Constitutional: She is oriented to person, place, and time. She appears well-developed and well-nourished. No distress.  HENT:  Head: Normocephalic and atraumatic.  Right Ear: Tympanic membrane and ear canal normal.  Left Ear: Tympanic membrane and ear canal normal.  Mouth/Throat: No oropharyngeal exudate, posterior oropharyngeal edema or posterior oropharyngeal erythema.  Cardiovascular: Normal rate and regular rhythm.   No murmur heard. Pulmonary/Chest: Effort normal and breath sounds  normal. No respiratory distress. She has no wheezes. She has no rales. She exhibits no tenderness.  Abdominal: Soft.  Musculoskeletal: She exhibits no edema.  Neurological: She is alert and oriented to person, place, and time.  Skin: Skin is warm and dry.  Hyperpigmented annular lesion noted left shin  Psychiatric: She has a normal mood and affect. Her behavior is normal. Judgment and thought content normal.          Assessment & Plan:

## 2013-06-19 NOTE — Patient Instructions (Addendum)
Start Lotrimin cream twice daily (available OTC) for the rash on the left leg.  If not improved in 2 weeks, let me know and we will refer you to dermatology.  You may use tylenol as needed for the next few days. Get plenty of rest. Call if you develop fever, if symptoms worsen, or if not improved by Monday. Continue diovan. Follow up in 3 months- sooner if needed.

## 2013-06-19 NOTE — Progress Notes (Signed)
Pre visit review using our clinic review tool, if applicable. No additional management support is needed unless otherwise documented below in the visit note. 

## 2013-06-20 DIAGNOSIS — R21 Rash and other nonspecific skin eruption: Secondary | ICD-10-CM | POA: Insufficient documentation

## 2013-06-20 DIAGNOSIS — J069 Acute upper respiratory infection, unspecified: Secondary | ICD-10-CM | POA: Insufficient documentation

## 2013-06-20 NOTE — Assessment & Plan Note (Signed)
Stable on diovan, continue same.

## 2013-06-20 NOTE — Assessment & Plan Note (Signed)
Sounds like she had a viral illness which resolve, and now has developed a new viral illness. Afebrile today, normal exam. Advised pt and husband to call if symptoms worsen or if not improved in 2-3 days.

## 2013-06-20 NOTE — Assessment & Plan Note (Signed)
Rash left shin appears fungal. Advised pt to try lotrimin cream. If no improvement with lotrimin, plan referral to dermatology.

## 2013-08-31 ENCOUNTER — Encounter: Payer: Self-pay | Admitting: Family

## 2013-08-31 ENCOUNTER — Ambulatory Visit (INDEPENDENT_AMBULATORY_CARE_PROVIDER_SITE_OTHER): Payer: BC Managed Care – PPO | Admitting: Family

## 2013-08-31 VITALS — BP 140/94 | HR 77 | Temp 98.7°F | Resp 16 | Ht 61.0 in | Wt 142.0 lb

## 2013-08-31 DIAGNOSIS — E119 Type 2 diabetes mellitus without complications: Secondary | ICD-10-CM

## 2013-08-31 DIAGNOSIS — E1140 Type 2 diabetes mellitus with diabetic neuropathy, unspecified: Secondary | ICD-10-CM

## 2013-08-31 DIAGNOSIS — I10 Essential (primary) hypertension: Secondary | ICD-10-CM

## 2013-08-31 DIAGNOSIS — M791 Myalgia, unspecified site: Secondary | ICD-10-CM

## 2013-08-31 DIAGNOSIS — R5381 Other malaise: Secondary | ICD-10-CM

## 2013-08-31 DIAGNOSIS — Z23 Encounter for immunization: Secondary | ICD-10-CM

## 2013-08-31 DIAGNOSIS — IMO0001 Reserved for inherently not codable concepts without codable children: Secondary | ICD-10-CM

## 2013-08-31 DIAGNOSIS — R5383 Other fatigue: Secondary | ICD-10-CM

## 2013-08-31 HISTORY — DX: Type 2 diabetes mellitus with diabetic neuropathy, unspecified: E11.40

## 2013-08-31 LAB — LIPID PANEL
Cholesterol: 202 mg/dL — ABNORMAL HIGH (ref 0–200)
HDL: 40 mg/dL (ref >39)
Total CHOL/HDL Ratio: 5.1 Ratio
Triglycerides: 705 mg/dL — ABNORMAL HIGH (ref <150)

## 2013-08-31 LAB — BASIC METABOLIC PANEL WITH GFR
BUN: 21 mg/dL (ref 6–23)
CALCIUM: 9.7 mg/dL (ref 8.4–10.5)
CO2: 25 mEq/L (ref 19–32)
Chloride: 107 mEq/L (ref 96–112)
Creat: 0.84 mg/dL (ref 0.50–1.10)
GFR, EST NON AFRICAN AMERICAN: 77 mL/min
GFR, Est African American: 89 mL/min
GLUCOSE: 88 mg/dL (ref 70–99)
POTASSIUM: 3.7 meq/L (ref 3.5–5.3)
SODIUM: 142 meq/L (ref 135–145)

## 2013-08-31 LAB — RHEUMATOID FACTOR

## 2013-08-31 LAB — HEMOGLOBIN A1C
Hgb A1c MFr Bld: 6.1 % — ABNORMAL HIGH (ref <5.7)
Mean Plasma Glucose: 128 mg/dL — ABNORMAL HIGH (ref <117)

## 2013-08-31 LAB — SEDIMENTATION RATE: Sed Rate: 11 mm/hr (ref 0–22)

## 2013-08-31 LAB — CK: Total CK: 81 U/L (ref 7–177)

## 2013-08-31 MED ORDER — VALSARTAN 160 MG PO TABS: 160.0000 mg | ORAL_TABLET | Freq: Every day | ORAL | Status: DC

## 2013-08-31 NOTE — Assessment & Plan Note (Signed)
Well controlled. Check A1C.

## 2013-08-31 NOTE — Assessment & Plan Note (Signed)
Advised pt to discontinue statin to see if this helps. Check CK level, ANA, RA, ESR to further evaluate.

## 2013-08-31 NOTE — Patient Instructions (Addendum)
Schedule physical at the front desk. Complete lab work prior to leaving.  Stop simvastatin. Increase diovan from 80mg  to 160mg . Follow up in 2 weeks.

## 2013-08-31 NOTE — Progress Notes (Signed)
Pre visit review using our clinic review tool, if applicable. No additional management support is needed unless otherwise documented below in the visit note. 

## 2013-08-31 NOTE — Progress Notes (Signed)
Subjective:    Patient ID: Katherine Kelly, female    DOB: Oct 03, 1955, 58 y.o.   MRN: [id number]  HPI  Mr. Smotherman is a 58 yr old female who presents today with chief complaint of joint pain and muscle pain.  She denies fatigue. Wonders if her pain is due to side effect of medication.   DM2- has been diet controlled. She reports that last time she checked sugar was 113.    HTN-  maintained on diovan.She report that she took med 30 min ago. Repeat BP check 150/98.   BP Readings from Last 3 Encounters:  08/31/13 140/94  06/19/13 108/72  01/30/13 116/80   Hyperlipidemia- maintained on statin.     Review of Systems See HPI  Past Medical History  Diagnosis Date  . History of colonic polyps   . Diabetes mellitus, type 2 09/2006    no meds  . Hypertension   . ACE-inhibitor cough     History   Social History  . Marital Status: Married    Spouse Name: N/A    Number of Children: N/A  . Years of Education: N/A   Occupational History  . Not on file.   Social History Main Topics  . Smoking status: Never Smoker   . Smokeless tobacco: Never Used  . Alcohol Use: No  . Drug Use: No  . Sexual Activity: Not on file   Other Topics Concern  . Not on file   Social History Narrative   Lives with husband and daughter   Freight forwarder at [employer]   No pets   Did not complete high school   Enjoys travel and shopping    Past Surgical History  Procedure Laterality Date  . Cesarean section  [place]  . Esophagogastroduodenoscopy  04/2006  . Cardiovascular stress test  11/08/2006  . Colonoscopy    . Polypectomy      Family History  Problem Relation Age of Onset  . Colon cancer Neg Hx   . Rectal cancer Neg Hx   . Stomach cancer Neg Hx     Allergies  Allergen Reactions  . Ace Inhibitors     weakness    Current Outpatient Prescriptions on File Prior to Visit  Medication Sig Dispense Refill  . aspirin EC 81 MG tablet Take 81 mg by mouth daily.      . cholecalciferol  (VITAMIN D) 1000 UNITS tablet Take 1,000 Units by mouth daily.      . fluticasone (FLONASE) 50 MCG/ACT nasal spray Place 2 sprays into the nose as needed.      . Lancets (ONETOUCH ULTRASOFT) lancets Check BS bid  DX 250.00  200 each  0  . simvastatin (ZOCOR) 10 MG tablet Take 1 tablet (10 mg total) by mouth at bedtime.  30 tablet  2  . valsartan (DIOVAN) 80 MG tablet Take 1 tablet (80 mg total) by mouth daily.  30 tablet  5   No current facility-administered medications on file prior to visit.    BP 140/94  Pulse 77  Temp(Src) 98.7 F (37.1 C) (Oral)  Resp 16  Ht 5\' 1"  (1.549 m)  Wt 142 lb (64.411 kg)  BMI 26.84 kg/m2  SpO2 99%       Objective:   Physical Exam  Constitutional: She is oriented to person, place, and time. She appears well-developed and well-nourished. No distress.  Cardiovascular: Normal rate and regular rhythm.   No murmur heard. Pulmonary/Chest: Effort normal and breath sounds  normal. No respiratory distress. She has no wheezes. She has no rales. She exhibits no tenderness.  Musculoskeletal: She exhibits no edema.  Neurological: She is alert and oriented to person, place, and time.          Assessment & Plan:

## 2013-08-31 NOTE — Assessment & Plan Note (Signed)
Uncontrolled.  Increase diovan from 80mg  to 160mg  daily.  Follow up in 2 weeks for bmet and bp check.

## 2013-09-01 LAB — ANA: ANA: NEGATIVE

## 2013-09-01 LAB — TSH: TSH: 0.835 u[IU]/mL (ref 0.350–4.500)

## 2013-09-02 ENCOUNTER — Telehealth: Payer: Self-pay | Admitting: Family

## 2013-09-02 DIAGNOSIS — E781 Pure hyperglyceridemia: Secondary | ICD-10-CM

## 2013-09-02 MED ORDER — FENOFIBRATE 145 MG PO TABS: 145.0000 mg | ORAL_TABLET | Freq: Every day | ORAL | Status: DC

## 2013-09-02 NOTE — Telephone Encounter (Signed)
Left message on home # to return my call. 

## 2013-09-02 NOTE — Telephone Encounter (Signed)
Triglycerides are extremely high >700 and should be <150.  I would recommend that she add tricor once daily and plant to repeat FLP/LFT in 6 weeks. Also, work on avoiding concentrated sweets, white fluffy carbs. Diabetes is well controlled. Rheumatoid testing is negative.

## 2013-09-04 NOTE — Telephone Encounter (Signed)
Notified pt's husband and he voices understanding. Lab order entered.

## 2013-09-14 ENCOUNTER — Encounter: Payer: Self-pay | Admitting: Family

## 2013-09-14 ENCOUNTER — Ambulatory Visit (INDEPENDENT_AMBULATORY_CARE_PROVIDER_SITE_OTHER): Payer: BC Managed Care – PPO | Admitting: Family

## 2013-09-14 VITALS — BP 132/84 | HR 73 | Temp 98.4°F | Resp 16 | Ht 61.0 in | Wt 139.1 lb

## 2013-09-14 DIAGNOSIS — IMO0001 Reserved for inherently not codable concepts without codable children: Secondary | ICD-10-CM

## 2013-09-14 DIAGNOSIS — M791 Myalgia, unspecified site: Secondary | ICD-10-CM

## 2013-09-14 DIAGNOSIS — I10 Essential (primary) hypertension: Secondary | ICD-10-CM

## 2013-09-14 LAB — BASIC METABOLIC PANEL
BUN: 18 mg/dL (ref 6–23)
CHLORIDE: 105 meq/L (ref 96–112)
CO2: 27 mEq/L (ref 19–32)
CREATININE: 0.8 mg/dL (ref 0.50–1.10)
Calcium: 10 mg/dL (ref 8.4–10.5)
GLUCOSE: 118 mg/dL — AB (ref 70–99)
Potassium: 4 mEq/L (ref 3.5–5.3)
Sodium: 142 mEq/L (ref 135–145)

## 2013-09-14 NOTE — Progress Notes (Signed)
   Subjective:    Patient ID: Katherine Kelly, female    DOB: 18-Mar-1956, 58 y.o.   MRN: [id number]  HPI  Katherine Kelly is a 58 yr old female who presents today for follow up.  HTN- last visit diovan dose was increased. BP Readings from Last 3 Encounters:  09/14/13 132/84  08/31/13 140/94  06/19/13 108/72   Reports no side effects from tricor and increased dose of diovan.  Body aches- report muscle aches are better since discontinuing statin.   Review of Systems  Constitutional: Negative for activity change and appetite change.  Gastrointestinal: Negative for nausea and abdominal pain.   She is compliant with anti hypertensive medication. A  low fat diet is followed. Exercise encompasses walking 30 minutes 3-5 times per week without symptoms.   No significant headaches, epistaxis, chest pain, palpitations, exertional dyspnea, claudication, paroxysmal nocturnal dyspnea, or edema.    Objective:   Physical Exam  Constitutional: She is oriented to person, place, and time. She appears well-developed and well-nourished. No distress.  HENT:  Head: Normocephalic and atraumatic.  Mouth/Throat: No oropharyngeal exudate.  Eyes: Pupils are equal, round, and reactive to light. No scleral icterus.  Cardiovascular: Normal rate, regular rhythm, normal heart sounds and intact distal pulses.   No murmur heard. Pulmonary/Chest: Effort normal and breath sounds normal. No respiratory distress.  Musculoskeletal: She exhibits no edema.  Neurological: She is alert and oriented to person, place, and time.  Skin: She is not diaphoretic.  Psychiatric: She has a normal mood and affect. Her behavior is normal.          Assessment & Plan:  Patient seen by [facility] NP-student.  I have personally seen and examined patient and agree with Katherine Kelly's assessment and plan.   Continue tricor and diovan 160 mg. Recheck lipids in 90 days. FU visit in 90 days.

## 2013-09-14 NOTE — Progress Notes (Signed)
Pre visit review using our clinic review tool, if applicable. No additional management support is needed unless otherwise documented below in the visit note. 

## 2013-09-14 NOTE — Patient Instructions (Signed)
Please complete lab work prior to leaving. Follow up in 3 months, come fasting to this appointment.

## 2013-09-15 ENCOUNTER — Encounter: Payer: Self-pay | Admitting: Family

## 2013-09-16 NOTE — Assessment & Plan Note (Signed)
BP improved on current dose of diovan, continue same, obtain bmet.

## 2013-09-16 NOTE — Assessment & Plan Note (Signed)
Improved off of statin, remain off, repeat FLP next visit.

## 2013-10-02 ENCOUNTER — Other Ambulatory Visit: Payer: Self-pay | Admitting: Family

## 2013-10-28 ENCOUNTER — Telehealth: Payer: Self-pay

## 2013-10-28 NOTE — Telephone Encounter (Signed)
Diabetic bundle states pt needs lipid. This was done on 08-31-13

## 2013-11-30 ENCOUNTER — Telehealth: Payer: Self-pay | Admitting: Family

## 2013-11-30 NOTE — Telephone Encounter (Signed)
Patients husband left message stating wife is due for refills of medication and last time she needed a refill Melissa wanted to see her first, wants to speak with assistant about wifes medications

## 2013-12-01 ENCOUNTER — Other Ambulatory Visit: Payer: Self-pay | Admitting: Family

## 2013-12-01 NOTE — Telephone Encounter (Signed)
Called pt, no answer. Left message on voice mail to return call.

## 2013-12-08 NOTE — Telephone Encounter (Signed)
Left message for pt's husband to return my call. Pt is due for follow up on or after 12/15/13.

## 2013-12-15 NOTE — Telephone Encounter (Signed)
Spoke with pt's husband. No questions at this time.

## 2013-12-29 ENCOUNTER — Ambulatory Visit: Payer: BC Managed Care – PPO | Admitting: Family

## 2013-12-29 ENCOUNTER — Telehealth: Payer: Self-pay | Admitting: *Deleted

## 2013-12-29 DIAGNOSIS — Z0289 Encounter for other administrative examinations: Secondary | ICD-10-CM

## 2013-12-29 NOTE — Telephone Encounter (Signed)
error 

## 2013-12-29 NOTE — Telephone Encounter (Signed)
Pt did not show for appointment 12/29/2013 at 2pm for medication refill.

## 2013-12-29 NOTE — Telephone Encounter (Signed)
Pt came in, thought appt was at 3.  Rescheduled for tomorrow, 12/30/2013 at 8:45am

## 2013-12-30 ENCOUNTER — Encounter: Payer: Self-pay | Admitting: Family

## 2013-12-30 ENCOUNTER — Ambulatory Visit (INDEPENDENT_AMBULATORY_CARE_PROVIDER_SITE_OTHER): Payer: BC Managed Care – PPO | Admitting: Family

## 2013-12-30 VITALS — BP 146/91 | HR 60 | Temp 97.6°F | Resp 16 | Ht 61.0 in | Wt 139.4 lb

## 2013-12-30 DIAGNOSIS — E785 Hyperlipidemia, unspecified: Secondary | ICD-10-CM

## 2013-12-30 DIAGNOSIS — E1149 Type 2 diabetes mellitus with other diabetic neurological complication: Secondary | ICD-10-CM

## 2013-12-30 DIAGNOSIS — E119 Type 2 diabetes mellitus without complications: Secondary | ICD-10-CM

## 2013-12-30 DIAGNOSIS — Z23 Encounter for immunization: Secondary | ICD-10-CM

## 2013-12-30 DIAGNOSIS — I10 Essential (primary) hypertension: Secondary | ICD-10-CM

## 2013-12-30 DIAGNOSIS — E781 Pure hyperglyceridemia: Secondary | ICD-10-CM

## 2013-12-30 MED ORDER — FENOFIBRATE 145 MG PO TABS: ORAL_TABLET | ORAL | Status: DC

## 2013-12-30 MED ORDER — VALSARTAN 160 MG PO TABS: ORAL_TABLET | ORAL | Status: DC

## 2013-12-30 NOTE — Assessment & Plan Note (Signed)
Stable, obtain A1C, refer for DM eye exam, flu shot today.

## 2013-12-30 NOTE — Assessment & Plan Note (Signed)
She is maintained on statin and tricor. She is instructed to return fasting for follow up labs/LFT.

## 2013-12-30 NOTE — Progress Notes (Signed)
Subjective:    Patient ID: Katherine Kelly, female    DOB: 1955/04/18, 58 y.o.   MRN: [id number]  HPI  Katherine Kelly is a 58 yr old female who presents today for follow up.  1)HTN- Current BP med includes valsartan. BP Readings from Last 3 Encounters:  12/30/13 146/91  09/14/13 132/84  08/31/13 140/94   2) DM2-  Not currently on meds- diet controlled. Reports no hypoglycemia. Reports last eye exam was several years ago. Flu shot today.  Lab Results  Component Value Date   HGBA1C 6.1* 08/31/2013   HGBA1C 6.3* 01/30/2013   HGBA1C 6.0* 11/23/2011   Lab Results  Component Value Date   MICROALBUR 12.99* 01/30/2013   LDLCALC NOT CALC 08/31/2013   CREATININE 0.80 09/14/2013   3) Hyperlipidemia- last visit tricor was added to her regimen. Reports + compliance.  She did have breakfast today.  Lab Results  Component Value Date   CHOL 202* 08/31/2013   HDL 40 08/31/2013   LDLCALC NOT CALC 08/31/2013   LDLDIRECT 64.9 12/17/2006   TRIG 705* 08/31/2013   CHOLHDL 5.1 08/31/2013    Review of Systems    see HPI  Past Medical History  Diagnosis Date  . History of colonic polyps   . Diabetes mellitus, type 2 09/2006    no meds  . Hypertension   . ACE-inhibitor cough     History   Social History  . Marital Status: Married    Spouse Name: N/A    Number of Children: N/A  . Years of Education: N/A   Occupational History  . Not on file.   Social History Main Topics  . Smoking status: Never Smoker   . Smokeless tobacco: Never Used  . Alcohol Use: No  . Drug Use: No  . Sexual Activity: Not on file   Other Topics Concern  . 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 Cesarean section  [place]  . Esophagogastroduodenoscopy  04/2006  . Cardiovascular stress test  11/08/2006  . Colonoscopy    .  Polypectomy      Family History  Problem Relation Age of Onset  . Colon cancer Neg Hx   . Rectal cancer Neg Hx   . Stomach cancer Neg Hx     Allergies  Allergen Reactions  . Ace Inhibitors     weakness    Current Outpatient Prescriptions on File Prior to Visit  Medication Sig Dispense Refill  . aspirin EC 81 MG tablet Take 81 mg by mouth daily.      . cholecalciferol (VITAMIN D) 1000 UNITS tablet Take 1,000 Units by mouth daily.      . fluticasone (FLONASE) 50 MCG/ACT nasal spray Place 2 sprays into the nose as needed.      . Lancets (ONETOUCH ULTRASOFT) lancets Check BS bid  DX 250.00  200 each  0  . simvastatin (ZOCOR) 10 MG tablet TAKE ONE TABLET BY MOUTH AT BEDTIME  30 tablet  5   No current facility-administered medications on file prior to visit.    BP 146/91  Pulse 60  Temp(Src) 97.6 F (36.4 C) (Oral)  Resp 16  Ht 5\' 1"  (1.549 m)  Wt 139 lb 6.4 oz (63.231 kg)  BMI  26.35 kg/m2  SpO2 99%    Objective:   Physical Exam  Constitutional: She is oriented to person, place, and time. She appears well-developed and well-nourished. No distress.  HENT:  Head: Normocephalic and atraumatic.  Cardiovascular: Normal rate and regular rhythm.   No murmur heard. Pulmonary/Chest: Effort normal and breath sounds normal. No respiratory distress. She has no wheezes. She has no rales. She exhibits no tenderness.  Musculoskeletal: She exhibits no edema.  Neurological: She is alert and oriented to person, place, and time.  Psychiatric: She has a normal mood and affect. Her behavior is normal. Judgment and thought content normal.          Assessment & Plan:

## 2013-12-30 NOTE — Patient Instructions (Addendum)
Please schedule a lab draw this week- come fasting to this appointment Increase diovan to twice daily. Follow up in 1 month so we can recheck your blood pressure.  Low-Sodium Eating Plan Sodium raises blood pressure and causes water to be held in the body. Getting less sodium from food will help lower your blood pressure, reduce any swelling, and protect your heart, liver, and kidneys. We get sodium by adding salt (sodium chloride) to food. Most of our sodium comes from canned, boxed, and frozen foods. Restaurant foods, fast foods, and pizza are also very high in sodium. Even if you take medicine to lower your blood pressure or to reduce fluid in your body, getting less sodium from your food is important. WHAT IS MY PLAN? Most people should limit their sodium intake to 2,300 mg a day. Your health care provider recommends that you limit your sodium intake to __________ a day.  WHAT DO I NEED TO KNOW ABOUT THIS EATING PLAN? For the low-sodium eating plan, you will follow these general guidelines:  Choose foods with a % Daily Value for sodium of less than 5% (as listed on the food label).   Use salt-free seasonings or herbs instead of table salt or sea salt.   Check with your health care provider or pharmacist before using salt substitutes.   Eat fresh foods.  Eat more vegetables and fruits.  Limit canned vegetables. If you do use them, rinse them well to decrease the sodium.   Limit cheese to 1 oz (28 g) per day.   Eat lower-sodium products, often labeled as "lower sodium" or "no salt added."  Avoid foods that contain monosodium glutamate (MSG). MSG is sometimes added to [place] food and some canned foods.  Check food labels (Nutrition Facts labels) on foods to learn how much sodium is in one serving.  Eat more home-cooked food and less restaurant, buffet, and fast food.  When eating at a restaurant, ask that your food be prepared with less salt or none, if possible.  HOW DO  I READ FOOD LABELS FOR SODIUM INFORMATION? The Nutrition Facts label lists the amount of sodium in one serving of the food. If you eat more than one serving, you must multiply the listed amount of sodium by the number of servings. Food labels may also identify foods as:  Sodium free--Less than 5 mg in a serving.  Very low sodium--35 mg or less in a serving.  Low sodium--140 mg or less in a serving.  Light in sodium--50% less sodium in a serving. For example, if a food that usually has 300 mg of sodium is changed to become light in sodium, it will have 150 mg of sodium.  Reduced sodium--25% less sodium in a serving. For example, if a food that usually has 400 mg of sodium is changed to reduced sodium, it will have 300 mg of sodium. WHAT FOODS CAN I EAT? Grains Low-sodium cereals, including oats, puffed wheat and rice, and shredded wheat cereals. Low-sodium crackers. Unsalted rice and pasta. Lower-sodium bread.  Vegetables Frozen or fresh vegetables. Low-sodium or reduced-sodium canned vegetables. Low-sodium or reduced-sodium tomato sauce and paste. Low-sodium or reduced-sodium tomato and vegetable juices.  Fruits Fresh, frozen, and canned fruit. Fruit juice.  Meat and Other Protein Products Low-sodium canned tuna and salmon. Fresh or frozen meat, poultry, seafood, and fish. Lamb. Unsalted nuts. Dried beans, peas, and lentils without added salt. Unsalted canned beans. Homemade soups without salt. Eggs.  Dairy Milk. Soy milk. Ricotta cheese. Low-sodium  or reduced-sodium cheeses. Yogurt.  Condiments Fresh and dried herbs and spices. Salt-free seasonings. Onion and garlic powders. Low-sodium varieties of mustard and ketchup. Lemon juice.  Fats and Oils Reduced-sodium salad dressings. Unsalted butter.  Other Unsalted popcorn and pretzels.  The items listed above may not be a complete list of recommended foods or beverages. Contact your dietitian for more options. WHAT FOODS ARE NOT  RECOMMENDED? Grains Instant hot cereals. Bread stuffing, pancake, and biscuit mixes. Croutons. Seasoned rice or pasta mixes. Noodle soup cups. Boxed or frozen macaroni and cheese. Self-rising flour. Regular salted crackers. Vegetables Regular canned vegetables. Regular canned tomato sauce and paste. Regular tomato and vegetable juices. Frozen vegetables in sauces. Salted french fries. Olives. Angie Fava. Relishes. Sauerkraut. Salsa. Meat and Other Protein Products Salted, canned, smoked, spiced, or pickled meats, seafood, or fish. Bacon, ham, sausage, hot dogs, corned beef, chipped beef, and packaged luncheon meats. Salt pork. Jerky. Pickled herring. Anchovies, regular canned tuna, and sardines. Salted nuts. Dairy Processed cheese and cheese spreads. Cheese curds. Blue cheese and cottage cheese. Buttermilk.  Condiments Onion and garlic salt, seasoned salt, table salt, and sea salt. Canned and packaged gravies. Worcestershire sauce. Tartar sauce. Barbecue sauce. Teriyaki sauce. Soy sauce, including reduced sodium. Steak sauce. Fish sauce. Oyster sauce. Cocktail sauce. Horseradish. Regular ketchup and mustard. Meat flavorings and tenderizers. Bouillon cubes. Hot sauce. Tabasco sauce. Marinades. Taco seasonings. Relishes. Fats and Oils Regular salad dressings. Salted butter. Margarine. Ghee. Bacon fat.  Other Potato and tortilla chips. Corn chips and puffs. Salted popcorn and pretzels. Canned or dried soups. Pizza. Frozen entrees and pot pies.  The items listed above may not be a complete list of foods and beverages to avoid. Contact your dietitian for more information. Document Released: 09/22/2001 Document Revised: 04/07/2013 Document Reviewed: 02/04/2013 [facility] Patient Information 2015 [place], [place]. This information is not intended to replace advice given to you by your health care provider. Make sure you discuss any questions you have with your health care provider.

## 2013-12-30 NOTE — Assessment & Plan Note (Addendum)
BP above goal. Increase diovan to bid. Follow up in 1 month for BP check and bmet. She requested info on low sodium diet which is provided today.

## 2013-12-30 NOTE — Progress Notes (Signed)
Pre visit review using our clinic review tool, if applicable. No additional management support is needed unless otherwise documented below in the visit note. 

## 2013-12-31 ENCOUNTER — Other Ambulatory Visit (INDEPENDENT_AMBULATORY_CARE_PROVIDER_SITE_OTHER): Payer: BC Managed Care – PPO

## 2013-12-31 DIAGNOSIS — E119 Type 2 diabetes mellitus without complications: Secondary | ICD-10-CM

## 2013-12-31 DIAGNOSIS — E785 Hyperlipidemia, unspecified: Secondary | ICD-10-CM

## 2013-12-31 LAB — BASIC METABOLIC PANEL
BUN: 18 mg/dL (ref 6–23)
CALCIUM: 9.3 mg/dL (ref 8.4–10.5)
CO2: 25 meq/L (ref 19–32)
CREATININE: 0.8 mg/dL (ref 0.4–1.2)
Chloride: 106 mEq/L (ref 96–112)
GFR: 74.08 mL/min (ref >60.00)
GLUCOSE: 107 mg/dL — AB (ref 70–99)
Potassium: 3.7 mEq/L (ref 3.5–5.1)
Sodium: 138 mEq/L (ref 135–145)

## 2013-12-31 LAB — LIPID PANEL
CHOL/HDL RATIO: 3
Cholesterol: 146 mg/dL (ref 0–200)
HDL: 53.7 mg/dL (ref >39.00)
LDL Cholesterol: 69 mg/dL (ref 0–99)
NONHDL: 92.3
Triglycerides: 118 mg/dL (ref 0.0–149.0)
VLDL: 23.6 mg/dL (ref 0.0–40.0)

## 2013-12-31 LAB — MICROALBUMIN / CREATININE URINE RATIO
Creatinine,U: 78.6 mg/dL
Microalb Creat Ratio: 3.4 mg/g (ref 0.0–30.0)
Microalb, Ur: 2.7 mg/dL — ABNORMAL HIGH (ref 0.0–1.9)

## 2013-12-31 LAB — HEMOGLOBIN A1C: HEMOGLOBIN A1C: 6.6 % — AB (ref 4.6–6.5)

## 2014-01-01 ENCOUNTER — Encounter: Payer: Self-pay | Admitting: Family

## 2014-01-01 ENCOUNTER — Other Ambulatory Visit: Payer: BC Managed Care – PPO

## 2014-01-11 LAB — HM DIABETES EYE EXAM

## 2014-02-01 ENCOUNTER — Ambulatory Visit (INDEPENDENT_AMBULATORY_CARE_PROVIDER_SITE_OTHER): Payer: BC Managed Care – PPO | Admitting: Family

## 2014-02-01 ENCOUNTER — Encounter: Payer: Self-pay | Admitting: Family

## 2014-02-01 VITALS — BP 130/80 | HR 69 | Temp 97.7°F | Resp 16 | Ht 61.0 in | Wt 137.6 lb

## 2014-02-01 DIAGNOSIS — I10 Essential (primary) hypertension: Secondary | ICD-10-CM

## 2014-02-01 LAB — BASIC METABOLIC PANEL
BUN: 23 mg/dL (ref 6–23)
CO2: 29 mEq/L (ref 19–32)
Calcium: 9.5 mg/dL (ref 8.4–10.5)
Chloride: 106 mEq/L (ref 96–112)
Creatinine, Ser: 0.9 mg/dL (ref 0.4–1.2)
GFR: 69.28 mL/min (ref >60.00)
Glucose, Bld: 149 mg/dL — ABNORMAL HIGH (ref 70–99)
Potassium: 4 mEq/L (ref 3.5–5.1)
SODIUM: 142 meq/L (ref 135–145)

## 2014-02-01 MED ORDER — VALSARTAN 160 MG PO TABS: ORAL_TABLET | ORAL | Status: DC

## 2014-02-01 NOTE — Progress Notes (Signed)
Pre visit review using our clinic review tool, if applicable. No additional management support is needed unless otherwise documented below in the visit note. 

## 2014-02-01 NOTE — Progress Notes (Signed)
Subjective:    Patient ID: Katherine Kelly, female    DOB: [birth year], 58 y.o.   MRN: [id number]  HPI  Katherine Kelly is a 58 year old female who presents today for follow up of her hypertension. Last visit diovan was increased from once daily to bid.  Denies CP/SOB/swelling. Reports that she has been exercising more and has lost some weight since last visit.   BP Readings from Last 3 Encounters:  02/01/14 130/80  12/30/13 146/91  09/14/13 132/84    Review of Systems See HPI  Past Medical History  Diagnosis Date  . History of colonic polyps   . Diabetes mellitus, type 2 09/2006    no meds  . Hypertension   . ACE-inhibitor cough     History   Social History  . Marital Status: Married    Spouse Name: N/A    Number of Children: N/A  . Years of Education: N/A   Occupational History  . Not on file.   Social History Main Topics  . Smoking status: Never Smoker   . Smokeless tobacco: Never Used  . Alcohol Use: No  . Drug Use: No  . Sexual Activity: Not on file   Other Topics Concern  . Not on file   Social History Narrative   Lives with husband and daughter   Freight forwarder at [employer]   No pets   Did not complete high school   Enjoys travel and shopping    Past Surgical History  Procedure Laterality Date  . Cesarean section  [place]  . Esophagogastroduodenoscopy  04/2006  . Cardiovascular stress test  11/08/2006  . Colonoscopy    . Polypectomy      Family History  Problem Relation Age of Onset  . Colon cancer Neg Hx   . Rectal cancer Neg Hx   . Stomach cancer Neg Hx     Allergies  Allergen Reactions  . Ace Inhibitors     weakness    Current Outpatient Prescriptions on File Prior to Visit  Medication Sig Dispense Refill  . aspirin EC 81 MG tablet Take 81 mg by mouth daily.      . cholecalciferol (VITAMIN D) 1000 UNITS tablet Take 1,000 Units by mouth daily.      . fenofibrate (TRICOR) 145 MG tablet TAKE ONE TABLET BY MOUTH ONCE DAILY  90 tablet  1   . fluticasone (FLONASE) 50 MCG/ACT nasal spray Place 2 sprays into the nose as needed.      . Lancets (ONETOUCH ULTRASOFT) lancets Check BS bid  DX 250.00  200 each  0  . simvastatin (ZOCOR) 10 MG tablet TAKE ONE TABLET BY MOUTH AT BEDTIME  30 tablet  5  . valsartan (DIOVAN) 160 MG tablet TAKE ONE TABLET BY MOUTH TWICE DAILY  60 tablet  1   No current facility-administered medications on file prior to visit.    BP 130/80  Pulse 69  Temp(Src) 97.7 F (36.5 C) (Oral)  Resp 16  Ht 5\' 1"  (1.549 m)  Wt 137 lb 9.6 oz (62.415 kg)  BMI 26.01 kg/m2  SpO2 99%       Objective:   Physical Exam  Constitutional: She is oriented to person, place, and time. She appears well-developed and well-nourished. No distress.  HENT:  Head: Normocephalic and atraumatic.  Cardiovascular: Normal rate and regular rhythm.   No murmur heard. Pulmonary/Chest: Effort normal and breath sounds normal. No respiratory distress. She has no wheezes. She has no  rales. She exhibits no tenderness.  Musculoskeletal: She exhibits no edema.  Neurological: She is alert and oriented to person, place, and time.  Psychiatric: She has a normal mood and affect. Her behavior is normal. Judgment and thought content normal.          Assessment & Plan:

## 2014-02-01 NOTE — Assessment & Plan Note (Signed)
Improved, continue bid diovan.  Continue current dose, obtain follow up bmet.  Commended pt on exercise and weight loss.

## 2014-02-01 NOTE — Patient Instructions (Signed)
Please complete lab work prior to leaving. Follow up in 3 months.  

## 2014-02-04 ENCOUNTER — Encounter: Payer: Self-pay | Admitting: Family

## 2014-04-07 ENCOUNTER — Ambulatory Visit (INDEPENDENT_AMBULATORY_CARE_PROVIDER_SITE_OTHER): Payer: BC Managed Care – PPO | Admitting: Family

## 2014-04-07 ENCOUNTER — Encounter: Payer: Self-pay | Admitting: Family

## 2014-04-07 VITALS — BP 110/70 | HR 66 | Temp 98.0°F | Resp 16 | Ht 61.0 in | Wt 135.0 lb

## 2014-04-07 DIAGNOSIS — E119 Type 2 diabetes mellitus without complications: Secondary | ICD-10-CM

## 2014-04-07 DIAGNOSIS — I10 Essential (primary) hypertension: Secondary | ICD-10-CM

## 2014-04-07 LAB — BASIC METABOLIC PANEL
BUN: 18 mg/dL (ref 6–23)
CHLORIDE: 106 meq/L (ref 96–112)
CO2: 26 mEq/L (ref 19–32)
CREATININE: 0.8 mg/dL (ref 0.4–1.2)
Calcium: 9.5 mg/dL (ref 8.4–10.5)
GFR: 79.45 mL/min (ref >60.00)
Glucose, Bld: 94 mg/dL (ref 70–99)
Potassium: 3.6 mEq/L (ref 3.5–5.1)
SODIUM: 139 meq/L (ref 135–145)

## 2014-04-07 LAB — HEMOGLOBIN A1C: HEMOGLOBIN A1C: 6.7 % — AB (ref 4.6–6.5)

## 2014-04-07 MED ORDER — VALSARTAN 160 MG PO TABS: ORAL_TABLET | ORAL | Status: DC

## 2014-04-07 MED ORDER — SIMVASTATIN 10 MG PO TABS: 10.0000 mg | ORAL_TABLET | Freq: Every day | ORAL | Status: DC

## 2014-04-07 NOTE — Addendum Note (Signed)
Addended by: Kelle Darting A on: 04/07/2014 02:28 PM   Modules accepted: Orders

## 2014-04-07 NOTE — Patient Instructions (Signed)
Please complete lab work prior to leaving. Follow up in 4 months.  Merry Christmas!

## 2014-04-07 NOTE — Assessment & Plan Note (Signed)
Stable on diovan, continue same, obtain follow up A1C.

## 2014-04-07 NOTE — Addendum Note (Signed)
Addended by: Debbrah Alar on: 04/07/2014 02:28 PM   Modules accepted: Miquel Dunn

## 2014-04-07 NOTE — Progress Notes (Signed)
Pre visit review using our clinic review tool, if applicable. No additional management support is needed unless otherwise documented below in the visit note. 

## 2014-04-07 NOTE — Assessment & Plan Note (Signed)
Stable, obtain a1c.  Continue diet and exercise.

## 2014-04-07 NOTE — Addendum Note (Signed)
Addended by: Peggyann Shoals on: 04/07/2014 02:32 PM   Modules accepted: Orders

## 2014-04-07 NOTE — Progress Notes (Signed)
Subjective:    Patient ID: Katherine Kelly, female    DOB: Sep 13, 1955, 58 y.o.   MRN: [id number]  HPI  Katherine Kelly is a 58 yr old female who presents today for follow up of her blood pressure. Patient is currently maintained on the following medications for blood pressure: diovan. Patient reports good compliance with blood pressure medications. Patient denies chest pain, shortness of breath or swelling. Last 3 blood pressure readings in our office are as follows: BP Readings from Last 3 Encounters:  04/07/14 110/70  02/01/14 130/80  12/30/13 146/91   DM2-Pt is currently maintained on the following medications for diabetes:none Last A1C was:   Lab Results  Component Value Date   HGBA1C 6.6* 12/31/2013  Last diabetic eye exam was 9/15 She has increased exercise and is eating healthier. Home glucose readings range 101-110       Review of Systems See HPI  Past Medical History  Diagnosis Date  . History of colonic polyps   . Diabetes mellitus, type 2 09/2006    no meds  . Hypertension   . ACE-inhibitor cough     History   Social History  . Marital Status: Married    Spouse Name: N/A    Number of Children: N/A  . Years of Education: N/A   Occupational History  . Not on file.   Social History Main Topics  . Smoking status: Never Smoker   . Smokeless tobacco: Never Used  . Alcohol Use: No  . Drug Use: No  . Sexual Activity: Not on file   Other Topics Concern  . Not on file   Social History Narrative   Lives with husband and daughter   Freight forwarder at [employer]   No pets   Did not complete high school   Enjoys travel and shopping    Past Surgical History  Procedure Laterality Date  . Cesarean section  [place]  . Esophagogastroduodenoscopy  04/2006  . Cardiovascular stress test  11/08/2006  . Colonoscopy    . Polypectomy      Family History  Problem Relation Age of Onset  . Colon cancer Neg Hx   . Rectal cancer Neg Hx   . Stomach cancer Neg Hx      Allergies  Allergen Reactions  . Ace Inhibitors     weakness    Current Outpatient Prescriptions on File Prior to Visit  Medication Sig Dispense Refill  . aspirin EC 81 MG tablet Take 81 mg by mouth daily.    . cholecalciferol (VITAMIN D) 1000 UNITS tablet Take 1,000 Units by mouth daily.    . fenofibrate (TRICOR) 145 MG tablet TAKE ONE TABLET BY MOUTH ONCE DAILY 90 tablet 1  . fluticasone (FLONASE) 50 MCG/ACT nasal spray Place 2 sprays into the nose as needed.    . Lancets (ONETOUCH ULTRASOFT) lancets Check BS bid  DX 250.00 200 each 0   No current facility-administered medications on file prior to visit.    BP 110/70 mmHg  Pulse 66  Temp(Src) 98 F (36.7 C) (Oral)  Resp 16  Ht 5\' 1"  (1.549 m)  Wt 135 lb (61.236 kg)  BMI 25.52 kg/m2  SpO2 99%       Objective:   Physical Exam  Constitutional: She is oriented to person, place, and time. She appears well-developed and well-nourished. No distress.  HENT:  Head: Normocephalic and atraumatic.  Cardiovascular: Normal rate and regular rhythm.   No murmur heard. Pulmonary/Chest: Effort normal and  breath sounds normal. No respiratory distress. She has no wheezes. She has no rales. She exhibits no tenderness.  Neurological: She is alert and oriented to person, place, and time.  Skin: Skin is warm and dry.  Psychiatric: She has a normal mood and affect. Her behavior is normal. Judgment and thought content normal.          Assessment & Plan:

## 2014-04-10 ENCOUNTER — Encounter: Payer: Self-pay | Admitting: Family

## 2014-05-04 ENCOUNTER — Ambulatory Visit: Payer: BC Managed Care – PPO | Admitting: Family

## 2014-07-02 ENCOUNTER — Telehealth: Payer: Self-pay | Admitting: Family

## 2014-07-02 NOTE — Telephone Encounter (Signed)
Caller name: Linton Ham Relation to pt: husband Call back number: [phone] [facility]acy: [facility] [address]  Reason for call:   Patient husband states that insurance is not covering fenofibrate. Prior auth needed

## 2014-07-02 NOTE — Telephone Encounter (Signed)
Drai from [facility] would like a callback regarding this stating that insurance prefers 54mg  or 160mg  [phone]  Patient is hoping that this can be done and filled today

## 2014-07-03 MED ORDER — FENOFIBRATE 160 MG PO TABS: 160.0000 mg | ORAL_TABLET | Freq: Every day | ORAL | Status: DC

## 2014-07-03 MED ORDER — FENOFIBRATE 54 MG PO TABS: 162.0000 mg | ORAL_TABLET | Freq: Every day | ORAL | Status: DC

## 2014-07-03 NOTE — Telephone Encounter (Signed)
Left detailed message on [facility] pharmacy voice mail to fill fenofibrate 160 and cancel other strengths on file.

## 2014-08-09 ENCOUNTER — Encounter: Payer: Self-pay | Admitting: Family

## 2014-08-09 ENCOUNTER — Ambulatory Visit (INDEPENDENT_AMBULATORY_CARE_PROVIDER_SITE_OTHER): Payer: 59 | Admitting: Family

## 2014-08-09 VITALS — BP 110/70 | HR 73 | Temp 98.1°F | Resp 16 | Ht 61.0 in | Wt 142.2 lb

## 2014-08-09 DIAGNOSIS — E119 Type 2 diabetes mellitus without complications: Secondary | ICD-10-CM

## 2014-08-09 DIAGNOSIS — F329 Major depressive disorder, single episode, unspecified: Secondary | ICD-10-CM

## 2014-08-09 DIAGNOSIS — R5383 Other fatigue: Secondary | ICD-10-CM

## 2014-08-09 DIAGNOSIS — F32A Depression, unspecified: Secondary | ICD-10-CM

## 2014-08-09 HISTORY — DX: Depression, unspecified: F32.A

## 2014-08-09 MED ORDER — TRIAMCINOLONE ACETONIDE 0.1 % EX CREA: 1.0000 "application " | TOPICAL_CREAM | Freq: Two times a day (BID) | CUTANEOUS | Status: DC

## 2014-08-09 MED ORDER — ESCITALOPRAM OXALATE 10 MG PO TABS: ORAL_TABLET | ORAL | Status: DC

## 2014-08-09 MED ORDER — FENOFIBRATE 160 MG PO TABS: 160.0000 mg | ORAL_TABLET | Freq: Every day | ORAL | Status: DC

## 2014-08-09 MED ORDER — VALSARTAN 160 MG PO TABS: ORAL_TABLET | ORAL | Status: DC

## 2014-08-09 MED ORDER — SIMVASTATIN 10 MG PO TABS: 10.0000 mg | ORAL_TABLET | Freq: Every day | ORAL | Status: DC

## 2014-08-09 MED ORDER — TRIAMCINOLONE ACETONIDE 0.1 % EX CREA
1.0000 | TOPICAL_CREAM | Freq: Two times a day (BID) | CUTANEOUS | Status: DC
Start: 2014-08-09 — End: 2014-08-10

## 2014-08-09 NOTE — Progress Notes (Signed)
Subjective:    Patient ID: Katherine Kelly, female    DOB: 1955-06-24, 59 y.o.   MRN: [id number]  HPI  Katherine Kelly is a 59 yr old female who presents today with complaint of fatigue. Reports feeling tired and weak x 2 months. Feels hot then cold intermittently. Denies fever.  Denies joint pain.   Wt Readings from Last 3 Encounters:  08/09/14 142 lb 3.2 oz (64.501 kg)  04/07/14 135 lb (61.236 kg)  02/01/14 137 lb 9.6 oz (62.415 kg)   Lab Results  Component Value Date   CHOL 146 12/31/2013   HDL 53.70 12/31/2013   LDLCALC 69 12/31/2013   LDLDIRECT 64.9 12/17/2006   TRIG 118.0 12/31/2013   CHOLHDL 3 12/31/2013      Has rash on right knee requests refill.    Pt is currently maintained on the following medications for diabetes:none-diet controlled. Last A1C was:   Lab Results  Component Value Date   HGBA1C 6.7* 04/07/2014   Sometimes mood is down.  Irritable.  Husband thinks that she is depressed.  Pt is interested in trying med. Denies nervousness/anxiety.  Review of Systems See HPI  Past Medical History  Diagnosis Date  . History of colonic polyps   . Diabetes mellitus, type 2 09/2006    no meds  . Hypertension   . ACE-inhibitor cough     History   Social History  . Marital Status: Married    Spouse Name: N/A  . Number of Children: N/A  . Years of Education: N/A   Occupational History  . Not on file.   Social History Main Topics  . Smoking status: Never Smoker   . Smokeless tobacco: Never Used  . Alcohol Use: No  . Drug Use: No  . Sexual Activity: Not on file   Other Topics Concern  . 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 Cesarean section  [place]  . Esophagogastroduodenoscopy  04/2006  . Cardiovascular stress test  11/08/2006  . Colonoscopy    .  Polypectomy      Family History  Problem Relation Age of Onset  . Colon cancer Neg Hx   . Rectal cancer Neg Hx   . Stomach cancer Neg Hx     Allergies  Allergen Reactions  . Ace Inhibitors     weakness    Current Outpatient Prescriptions on File Prior to Visit  Medication Sig Dispense Refill  . cholecalciferol (VITAMIN D) 1000 UNITS tablet Take 1,000 Units by mouth daily.    . Lancets (ONETOUCH ULTRASOFT) lancets Check BS bid  DX 250.00 200 each 0  . aspirin EC 81 MG tablet Take 81 mg by mouth daily.    . fluticasone (FLONASE) 50 MCG/ACT nasal spray Place 2 sprays into the nose as needed.     No current facility-administered medications on file prior to visit.    BP 110/70 mmHg  Pulse 73  Temp(Src) 98.1 F (36.7 C) (Oral)  Resp 16  Ht 5\' 1"  (1.549 m)  Wt 142 lb 3.2 oz (64.501 kg)  BMI 26.88 kg/m2  SpO2 99%       Objective:   Physical Exam  Constitutional: She is oriented to person, place, and time. She appears well-developed and  well-nourished.  Cardiovascular: Normal rate, regular rhythm and normal heart sounds.   No murmur heard. Pulmonary/Chest: Effort normal and breath sounds normal. No respiratory distress. She has no wheezes.  Neurological: She is alert and oriented to person, place, and time.  Psychiatric: Her behavior is normal. Judgment and thought content normal.  Slightly flat affect          Assessment & Plan:

## 2014-08-09 NOTE — Patient Instructions (Addendum)
Start lexapro 10mg - 1/2 tab by mouth once daily. Follow up in 1 month.  Schedule lab visit at the front desk.

## 2014-08-09 NOTE — Progress Notes (Signed)
Pre visit review using our clinic review tool, if applicable. No additional management support is needed unless otherwise documented below in the visit note. 

## 2014-08-10 ENCOUNTER — Other Ambulatory Visit: Payer: Self-pay | Admitting: Family

## 2014-08-10 ENCOUNTER — Other Ambulatory Visit (INDEPENDENT_AMBULATORY_CARE_PROVIDER_SITE_OTHER): Payer: 59

## 2014-08-10 DIAGNOSIS — R5383 Other fatigue: Secondary | ICD-10-CM | POA: Diagnosis not present

## 2014-08-10 DIAGNOSIS — E119 Type 2 diabetes mellitus without complications: Secondary | ICD-10-CM | POA: Diagnosis not present

## 2014-08-10 LAB — CBC WITH DIFFERENTIAL/PLATELET
BASOS ABS: 0 10*3/uL (ref 0.0–0.1)
Basophils Relative: 0.8 % (ref 0.0–3.0)
EOS PCT: 4.2 % (ref 0.0–5.0)
Eosinophils Absolute: 0.3 10*3/uL (ref 0.0–0.7)
HCT: 41.1 % (ref 36.0–46.0)
HEMOGLOBIN: 13.8 g/dL (ref 12.0–15.0)
Lymphocytes Relative: 32.5 % (ref 12.0–46.0)
Lymphs Abs: 2 10*3/uL (ref 0.7–4.0)
MCHC: 33.6 g/dL (ref 30.0–36.0)
MCV: 97.2 fl (ref 78.0–100.0)
Monocytes Absolute: 0.5 10*3/uL (ref 0.1–1.0)
Monocytes Relative: 8.2 % (ref 3.0–12.0)
NEUTROS ABS: 3.4 10*3/uL (ref 1.4–7.7)
Neutrophils Relative %: 54.3 % (ref 43.0–77.0)
PLATELETS: 167 10*3/uL (ref 150.0–400.0)
RBC: 4.23 Mil/uL (ref 3.87–5.11)
RDW: 13.1 % (ref 11.5–15.5)
WBC: 6.2 10*3/uL (ref 4.0–10.5)

## 2014-08-10 LAB — BASIC METABOLIC PANEL
BUN: 22 mg/dL (ref 6–23)
CALCIUM: 9.4 mg/dL (ref 8.4–10.5)
CO2: 24 mEq/L (ref 19–32)
CREATININE: 0.79 mg/dL (ref 0.40–1.20)
Chloride: 109 mEq/L (ref 96–112)
GFR: 79.35 mL/min (ref >60.00)
GLUCOSE: 97 mg/dL (ref 70–99)
POTASSIUM: 4.1 meq/L (ref 3.5–5.1)
Sodium: 143 mEq/L (ref 135–145)

## 2014-08-10 LAB — HEMOGLOBIN A1C: HEMOGLOBIN A1C: 6.7 % — AB (ref 4.6–6.5)

## 2014-08-10 LAB — TSH: TSH: 2.06 u[IU]/mL (ref 0.35–4.50)

## 2014-08-10 MED ORDER — TRIAMCINOLONE ACETONIDE 0.1 % EX CREA: 1.0000 "application " | TOPICAL_CREAM | Freq: Two times a day (BID) | CUTANEOUS | Status: DC

## 2014-08-10 NOTE — Telephone Encounter (Signed)
Ok to send 60 g.

## 2014-08-10 NOTE — Telephone Encounter (Signed)
Caller name:Xuan Alfonse Spruce Relation to OI:PPGFQM Call back number:[phone] Pharmacy:[facility]  Reason for call: pt's husband states that [facility] informed him that the rx triamcinolone cream (KENALOG) 0.1 % could not be refilled because it was to early to fill it pt states it is a new rx so doesn't understand why it could not be filled  And pharmacy did not get the rx for escitalopram (LEXAPRO) 10 MG tablet , would like for you to resend the prescriptions.

## 2014-08-10 NOTE — Telephone Encounter (Signed)
Verified with pharmacy that they did receive both Rxs below. States fenofibrate was too early to fill as they show rx was filled at another pharmacy on 08/09/14. Notified pt's spouse.  He asks if they can get a larger quantity for cream. States last rx pt had was a jar.  Please advise.

## 2014-08-10 NOTE — Telephone Encounter (Signed)
Per verbal from PCP, a jar is too much for area being treated by pt and 60g tube should be sufficient. Notified pt spouse that we re-sent Rx for 60g and he voices understanding.

## 2014-08-11 ENCOUNTER — Encounter: Payer: Self-pay | Admitting: Family

## 2014-08-12 NOTE — Assessment & Plan Note (Signed)
Lab Results  Component Value Date   HGBA1C 6.7* 08/10/2014   Follow up A1C stable and at goal, continue diabetic diet.

## 2014-08-12 NOTE — Assessment & Plan Note (Addendum)
I think that this is most likely cause for her poor energy.  TSH and CBC are normal.  Will initiate trial of lexapro 10mg .  I instructed pt to start 1/2 tablet once daily for 1 week and then increase to a full tablet once daily on week two as tolerated.  We discussed common side effects such as nausea, drowsiness and weight gain.    Plan follow up in 1 month to evaluate progress.

## 2014-09-14 ENCOUNTER — Encounter: Payer: Self-pay | Admitting: Family

## 2014-09-14 ENCOUNTER — Ambulatory Visit (INDEPENDENT_AMBULATORY_CARE_PROVIDER_SITE_OTHER): Payer: 59 | Admitting: Family

## 2014-09-14 VITALS — BP 126/70 | HR 62 | Temp 97.8°F | Resp 16 | Ht 61.0 in | Wt 140.8 lb

## 2014-09-14 DIAGNOSIS — L309 Dermatitis, unspecified: Secondary | ICD-10-CM

## 2014-09-14 DIAGNOSIS — F329 Major depressive disorder, single episode, unspecified: Secondary | ICD-10-CM | POA: Diagnosis not present

## 2014-09-14 DIAGNOSIS — Z0000 Encounter for general adult medical examination without abnormal findings: Secondary | ICD-10-CM

## 2014-09-14 DIAGNOSIS — F32A Depression, unspecified: Secondary | ICD-10-CM

## 2014-09-14 HISTORY — DX: Dermatitis, unspecified: L30.9

## 2014-09-14 MED ORDER — CLOBETASOL PROP EMOLLIENT BASE 0.05 % EX CREA: 1.0000 "application " | TOPICAL_CREAM | Freq: Two times a day (BID) | CUTANEOUS | Status: DC | PRN

## 2014-09-14 MED ORDER — ESCITALOPRAM OXALATE 10 MG PO TABS: ORAL_TABLET | ORAL | Status: DC

## 2014-09-14 NOTE — Assessment & Plan Note (Addendum)
Discussed referral to derm. She declines at this time. Requests trial of betamethasone.

## 2014-09-14 NOTE — Assessment & Plan Note (Signed)
Improved on lexapro, continue same.

## 2014-09-14 NOTE — Patient Instructions (Signed)
Continue lexapro once daily.

## 2014-09-14 NOTE — Progress Notes (Signed)
Subjective:    Patient ID: Katherine Kelly, female    DOB: 12-Jan-1956, 59 y.o.   MRN: [id number]  HPI  Ms. Suchocki is a 59 yr old female who presents today for follow up of her depression.  Last visit she was started on lexapro 10mg . Reports improvement in mood and energy. Denies side effects.   Wt Readings from Last 3 Encounters:  09/14/14 140 lb 12.8 oz (63.866 kg)  08/09/14 142 lb 3.2 oz (64.501 kg)  04/07/14 135 lb (61.236 kg)   She is requesting betamethasone rx for knees. Tried triamcinolone without improvement.    Review of Systems See HPI  Past Medical History  Diagnosis Date  . History of colonic polyps   . Diabetes mellitus, type 2 09/2006    no meds  . Hypertension   . ACE-inhibitor cough     History   Social History  . Marital Status: Married    Spouse Name: N/A  . Number of Children: N/A  . Years of Education: N/A   Occupational History  . Not on file.   Social History Main Topics  . Smoking status: Never Smoker   . Smokeless tobacco: Never Used  . Alcohol Use: No  . Drug Use: No  . Sexual Activity: Not on file   Other Topics Concern  . 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 Cesarean section  [place]  . Esophagogastroduodenoscopy  04/2006  . Cardiovascular stress test  11/08/2006  . Colonoscopy    . Polypectomy      Family History  Problem Relation Age of Onset  . Colon cancer Neg Hx   . Rectal cancer Neg Hx   . Stomach cancer Neg Hx     Allergies  Allergen Reactions  . Ace Inhibitors     weakness    Current Outpatient Prescriptions on File Prior to Visit  Medication Sig Dispense Refill  . aspirin EC 81 MG tablet Take 81 mg by mouth daily.    . cholecalciferol (VITAMIN D) 1000 UNITS tablet Take 1,000 Units by mouth daily.    . fenofibrate  160 MG tablet Take 1 tablet (160 mg total) by mouth daily. 90 tablet 1  . fluticasone (FLONASE) 50 MCG/ACT nasal spray Place 2 sprays into the nose as needed.    . Lancets (ONETOUCH ULTRASOFT) lancets Check BS bid  DX 250.00 200 each 0  . simvastatin (ZOCOR) 10 MG tablet Take 1 tablet (10 mg total) by mouth at bedtime. 90 tablet 1  . valsartan (DIOVAN) 160 MG tablet TAKE ONE TABLET BY MOUTH TWICE DAILY 180 tablet 1   No current facility-administered medications on file prior to visit.    BP 126/70 mmHg  Pulse 62  Temp(Src) 97.8 F (36.6 C) (Oral)  Resp 16  Ht 5\' 1"  (1.549 m)  Wt 140 lb 12.8 oz (63.866 kg)  BMI 26.62 kg/m2  SpO2 98%        Objective:   Physical Exam  Constitutional: She is oriented to person, place, and time. She appears well-developed and well-nourished.  HENT:  Head: Normocephalic and atraumatic.  Musculoskeletal: She exhibits no edema.  Neurological: She is alert and oriented to person, place, and time.  Skin:  Eczematous plaques noted  on right knee.   Psychiatric: She has a normal mood and affect. Her behavior is normal. Judgment and thought content normal.          Assessment & Plan:

## 2014-09-21 ENCOUNTER — Ambulatory Visit (HOSPITAL_BASED_OUTPATIENT_CLINIC_OR_DEPARTMENT_OTHER): Payer: 59

## 2014-10-15 ENCOUNTER — Telehealth: Payer: Self-pay | Admitting: Family

## 2014-10-15 NOTE — Telephone Encounter (Signed)
Caller name: xua Relation to XM:IWOEHOZ Call back number: [facility] [address]  Reason for call:   Requesting simvastatin refill for patient

## 2014-10-19 NOTE — Telephone Encounter (Signed)
Left message for pt's spouse to return my call. Simvastatin Rx sent 08/09/14, #90 x 1 refill. He needs to speak with pharmacist to request rx from 07/2014.

## 2014-10-25 ENCOUNTER — Inpatient Hospital Stay (HOSPITAL_BASED_OUTPATIENT_CLINIC_OR_DEPARTMENT_OTHER): Admission: RE | Admit: 2014-10-25 | Payer: 59 | Source: Ambulatory Visit

## 2014-11-01 ENCOUNTER — Ambulatory Visit (HOSPITAL_BASED_OUTPATIENT_CLINIC_OR_DEPARTMENT_OTHER)
Admission: RE | Admit: 2014-11-01 | Discharge: 2014-11-01 | Disposition: A | Discharge status: Home / Self Care | Payer: 59 | Source: Ambulatory Visit | Attending: Family | Admitting: Family

## 2014-11-01 DIAGNOSIS — R928 Other abnormal and inconclusive findings on diagnostic imaging of breast: Secondary | ICD-10-CM | POA: Insufficient documentation

## 2014-11-01 DIAGNOSIS — Z0000 Encounter for general adult medical examination without abnormal findings: Secondary | ICD-10-CM

## 2014-11-01 DIAGNOSIS — Z1231 Encounter for screening mammogram for malignant neoplasm of breast: Secondary | ICD-10-CM | POA: Diagnosis not present

## 2014-11-03 ENCOUNTER — Other Ambulatory Visit: Payer: Self-pay | Admitting: Family

## 2014-11-03 DIAGNOSIS — R928 Other abnormal and inconclusive findings on diagnostic imaging of breast: Secondary | ICD-10-CM

## 2014-11-08 ENCOUNTER — Ambulatory Visit
Admission: RE | Admit: 2014-11-08 | Discharge: 2014-11-08 | Disposition: A | Discharge status: Home / Self Care | Payer: 59 | Source: Ambulatory Visit | Attending: Family | Admitting: Family

## 2014-11-08 ENCOUNTER — Other Ambulatory Visit: Payer: Self-pay | Admitting: Family

## 2014-11-08 DIAGNOSIS — R928 Other abnormal and inconclusive findings on diagnostic imaging of breast: Secondary | ICD-10-CM

## 2014-11-12 ENCOUNTER — Ambulatory Visit
Admission: RE | Admit: 2014-11-12 | Discharge: 2014-11-12 | Disposition: A | Discharge status: Home / Self Care | Payer: 59 | Source: Ambulatory Visit | Attending: Family | Admitting: Family

## 2014-11-12 ENCOUNTER — Other Ambulatory Visit: Payer: Self-pay | Admitting: Family

## 2014-11-12 DIAGNOSIS — R928 Other abnormal and inconclusive findings on diagnostic imaging of breast: Secondary | ICD-10-CM

## 2014-12-13 ENCOUNTER — Ambulatory Visit (INDEPENDENT_AMBULATORY_CARE_PROVIDER_SITE_OTHER): Payer: 59 | Admitting: Family

## 2014-12-13 ENCOUNTER — Encounter: Payer: Self-pay | Admitting: Family

## 2014-12-13 VITALS — BP 126/84 | HR 70 | Temp 97.8°F | Resp 14 | Ht 61.0 in | Wt 142.5 lb

## 2014-12-13 DIAGNOSIS — F32A Depression, unspecified: Secondary | ICD-10-CM

## 2014-12-13 DIAGNOSIS — E781 Pure hyperglyceridemia: Secondary | ICD-10-CM

## 2014-12-13 DIAGNOSIS — E119 Type 2 diabetes mellitus without complications: Secondary | ICD-10-CM | POA: Diagnosis not present

## 2014-12-13 DIAGNOSIS — Z23 Encounter for immunization: Secondary | ICD-10-CM

## 2014-12-13 DIAGNOSIS — F329 Major depressive disorder, single episode, unspecified: Secondary | ICD-10-CM

## 2014-12-13 DIAGNOSIS — I10 Essential (primary) hypertension: Secondary | ICD-10-CM | POA: Diagnosis not present

## 2014-12-13 MED ORDER — SIMVASTATIN 10 MG PO TABS: 10.0000 mg | ORAL_TABLET | Freq: Every day | ORAL | Status: DC

## 2014-12-13 NOTE — Patient Instructions (Signed)
Please schedule lab work prior to leaving.

## 2014-12-13 NOTE — Progress Notes (Signed)
Pre visit review using our clinic review tool, if applicable. No additional management support is needed unless otherwise documented below in the visit note. 

## 2014-12-13 NOTE — Progress Notes (Signed)
Subjective:    Patient ID: Katherine Kelly, female    DOB: 11-08-1955, 60 y.o.   MRN: [id number]  HPI  Katherine Kelly is a 59 yr old female who presents for follow up.  DM2-  Reports that her sugars usually 109-119. Controlled with diet.  Lab Results  Component Value Date   HGBA1C 6.7* 08/10/2014   HGBA1C 6.7* 04/07/2014   HGBA1C 6.6* 12/31/2013   Lab Results  Component Value Date   MICROALBUR 2.7* 12/31/2013   LDLCALC 69 12/31/2013   CREATININE 0.79 08/10/2014   HTN-  Maintained on diovan BP Readings from Last 3 Encounters:  12/13/14 126/84  09/14/14 126/70  08/09/14 110/70   Depression- maintained on lexapro 10mg . Mood remains stable/improved.    Hypertriglyceridemia- on simvastatin- not taking, never got refill.  Lab Results  Component Value Date   CHOL 146 12/31/2013   HDL 53.70 12/31/2013   LDLCALC 69 12/31/2013   LDLDIRECT 64.9 12/17/2006   TRIG 118.0 12/31/2013   CHOLHDL 3 12/31/2013     Review of Systems See HPI  Past Medical History  Diagnosis Date  . History of colonic polyps   . Diabetes mellitus, type 2 09/2006    no meds  . Hypertension   . ACE-inhibitor cough     Social History   Social History  . Marital Status: Married    Spouse Name: N/A  . Number of Children: N/A  . Years of Education: N/A   Occupational History  . Not on file.   Social History Main Topics  . Smoking status: Never Smoker   . Smokeless tobacco: Never Used  . Alcohol Use: No  . Drug Use: No  . Sexual Activity: Not on file   Other Topics Concern  . Not on file   Social History Narrative   Lives with husband and daughter   Freight forwarder at [employer]   No pets   Did not complete high school   Enjoys travel and shopping    Past Surgical History  Procedure Laterality Date  . Cesarean section  [place]  . Esophagogastroduodenoscopy  04/2006  . Cardiovascular stress test  11/08/2006  . Colonoscopy    . Polypectomy      Family History  Problem Relation  Age of Onset  . Colon cancer Neg Hx   . Rectal cancer Neg Hx   . Stomach cancer Neg Hx     Allergies  Allergen Reactions  . Ace Inhibitors     weakness    Current Outpatient Prescriptions on File Prior to Visit  Medication Sig Dispense Refill  . aspirin EC 81 MG tablet Take 81 mg by mouth daily.    . cholecalciferol (VITAMIN D) 1000 UNITS tablet Take 1,000 Units by mouth daily.    . Clobetasol Prop Emollient Base 0.05 % emollient cream Apply 1 application topically 2 (two) times daily as needed. 30 g 1  . escitalopram (LEXAPRO) 10 MG tablet One tab by mouth once daily. 30 tablet 5  . fenofibrate 160 MG tablet Take 1 tablet (160 mg total) by mouth daily. 90 tablet 1  . fluticasone (FLONASE) 50 MCG/ACT nasal spray Place 2 sprays into the nose as needed.    . valsartan (DIOVAN) 160 MG tablet TAKE ONE TABLET BY MOUTH TWICE DAILY 180 tablet 1  . Lancets (ONETOUCH ULTRASOFT) lancets Check BS bid  DX 250.00 (Patient not taking: Reported on 12/13/2014) 200 each 0   No current facility-administered medications on file prior to  visit.    BP 126/84 mmHg  Pulse 70  Temp(Src) 97.8 F (36.6 C) (Oral)  Resp 14  Ht 5\' 1"  (1.549 m)  Wt 142 lb 8 oz (64.638 kg)  BMI 26.94 kg/m2  SpO2 95%        Objective:   Physical Exam  Constitutional: She is oriented to person, place, and time. She appears well-developed and well-nourished.  HENT:  Head: Normocephalic and atraumatic.  Eyes: No scleral icterus.  Cardiovascular: Normal rate, regular rhythm and normal heart sounds.   No murmur heard. Pulmonary/Chest: Effort normal and breath sounds normal. No respiratory distress. She has no wheezes.  Musculoskeletal: She exhibits no edema.  Neurological: She is alert and oriented to person, place, and time.  Skin: Skin is warm and dry.  Psychiatric: She has a normal mood and affect. Her behavior is normal. Judgment and thought content normal.          Assessment & Plan:

## 2014-12-14 ENCOUNTER — Other Ambulatory Visit (INDEPENDENT_AMBULATORY_CARE_PROVIDER_SITE_OTHER): Payer: 59

## 2014-12-14 DIAGNOSIS — E119 Type 2 diabetes mellitus without complications: Secondary | ICD-10-CM

## 2014-12-14 LAB — MICROALBUMIN / CREATININE URINE RATIO
CREATININE, U: 100.4 mg/dL
MICROALB UR: 2.3 mg/dL — AB (ref 0.0–1.9)
MICROALB/CREAT RATIO: 2.3 mg/g (ref 0.0–30.0)

## 2014-12-14 LAB — BASIC METABOLIC PANEL
BUN: 24 mg/dL — ABNORMAL HIGH (ref 6–23)
CO2: 28 mEq/L (ref 19–32)
CREATININE: 0.9 mg/dL (ref 0.40–1.20)
Calcium: 9.6 mg/dL (ref 8.4–10.5)
Chloride: 105 mEq/L (ref 96–112)
GFR: 68.19 mL/min (ref >60.00)
Glucose, Bld: 107 mg/dL — ABNORMAL HIGH (ref 70–99)
Potassium: 4.1 mEq/L (ref 3.5–5.1)
SODIUM: 140 meq/L (ref 135–145)

## 2014-12-14 LAB — HEMOGLOBIN A1C: Hgb A1c MFr Bld: 6.8 % — ABNORMAL HIGH (ref 4.6–6.5)

## 2014-12-14 NOTE — Assessment & Plan Note (Signed)
Improved on lexapro, continue same.  

## 2014-12-14 NOTE — Assessment & Plan Note (Addendum)
Resume statin. Plan flp next visit.

## 2014-12-14 NOTE — Assessment & Plan Note (Signed)
BP stable on diovan, continue same, obtain bmet.

## 2014-12-14 NOTE — Assessment & Plan Note (Signed)
Clinically stable, obtain A1C.

## 2014-12-16 ENCOUNTER — Encounter: Payer: Self-pay | Admitting: Family

## 2015-02-08 ENCOUNTER — Ambulatory Visit (INDEPENDENT_AMBULATORY_CARE_PROVIDER_SITE_OTHER): Payer: 59 | Admitting: Physician Assistant

## 2015-02-08 ENCOUNTER — Encounter: Payer: Self-pay | Admitting: Physician Assistant

## 2015-02-08 VITALS — BP 123/76 | HR 67 | Temp 97.7°F | Resp 16 | Ht 61.0 in | Wt 143.0 lb

## 2015-02-08 DIAGNOSIS — J209 Acute bronchitis, unspecified: Secondary | ICD-10-CM | POA: Diagnosis not present

## 2015-02-08 MED ORDER — AZITHROMYCIN 250 MG PO TABS: ORAL_TABLET | ORAL | Status: DC

## 2015-02-08 NOTE — Progress Notes (Signed)
Patient presents to clinic today c/o 5 days of cough, chest congestion and sore throat with PND x 5 days. Symptoms are worsening. Cough is now becoming productive.  Denies fever, chills, chest pain or SOB. Denies recent travel or sick contact.  Has been taking Delsym for cough.  Past Medical History  Diagnosis Date  . History of colonic polyps   . Diabetes mellitus, type 2 ([place]) 09/2006    no meds  . Hypertension   . ACE-inhibitor cough     Current Outpatient Prescriptions on File Prior to Visit  Medication Sig Dispense Refill  . aspirin EC 81 MG tablet Take 81 mg by mouth daily.    . cholecalciferol (VITAMIN D) 1000 UNITS tablet Take 1,000 Units by mouth daily.    . Clobetasol Prop Emollient Base 0.05 % emollient cream Apply 1 application topically 2 (two) times daily as needed. 30 g 1  . escitalopram (LEXAPRO) 10 MG tablet One tab by mouth once daily. 30 tablet 5  . fenofibrate 160 MG tablet Take 1 tablet (160 mg total) by mouth daily. 90 tablet 1  . fluticasone (FLONASE) 50 MCG/ACT nasal spray Place 2 sprays into the nose as needed.    . Lancets (ONETOUCH ULTRASOFT) lancets Check BS bid  DX 250.00 200 each 0  . simvastatin (ZOCOR) 10 MG tablet Take 1 tablet (10 mg total) by mouth at bedtime. 90 tablet 1  . valsartan (DIOVAN) 160 MG tablet TAKE ONE TABLET BY MOUTH TWICE DAILY 180 tablet 1   No current facility-administered medications on file prior to visit.    Allergies  Allergen Reactions  . Ace Inhibitors     weakness    Family History  Problem Relation Age of Onset  . Colon cancer Neg Hx   . Rectal cancer Neg Hx   . Stomach cancer Neg Hx     Social History   Social History  . Marital Status: Married    Spouse Name: N/A  . Number of Children: N/A  . Years of Education: N/A   Social History Main Topics  . Smoking status: Never Smoker   . Smokeless tobacco: Never Used  . Alcohol Use: No  . Drug Use: No  . Sexual Activity: Not Asked   Other Topics Concern    . 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  All other ROS are negative.  BP 123/76 mmHg  Pulse 67  Temp(Src) 97.7 F (36.5 C) (Oral)  Resp 16  Ht 5\' 1"  (1.549 m)  Wt 143 lb (64.864 kg)  BMI 27.03 kg/m2  SpO2 100%  Physical Exam  Constitutional: She is oriented to person, place, and time and well-developed, well-nourished, and in no distress.  HENT:  Head: Normocephalic and atraumatic.  Right Ear: External ear normal.  Left Ear: External ear normal.  Nose: Nose normal.  Mouth/Throat: Oropharynx is clear and moist. No oropharyngeal exudate.  TM within normal limits   Eyes: Conjunctivae are normal.  Neck: Neck supple.  Cardiovascular: Normal rate, regular rhythm, normal heart sounds and intact distal pulses.   Pulmonary/Chest: Effort normal and breath sounds normal. No respiratory distress. She has no wheezes. She has no rales. She exhibits no tenderness.  Neurological: She is alert and oriented to person, place, and time.  Skin:  Skin is warm and dry. No rash noted.  Psychiatric: Affect normal.  Vitals reviewed.  Recent Results (from the past 2160 hour(s))  Hemoglobin A1c     Status: Abnormal   Collection Time: 12/14/14  8:11 AM  Result Value Ref Range   Hgb A1c MFr Bld 6.8 (H) 4.6 - 6.5 %    Comment: Glycemic Control Guidelines for People with Diabetes:Non Diabetic:  <6%Goal of Therapy: <7%Additional Action Suggested:  >4%   Basic metabolic panel     Status: Abnormal   Collection Time: 12/14/14  8:11 AM  Result Value Ref Range   Sodium 140 135 - 145 mEq/L   Potassium 4.1 3.5 - 5.1 mEq/L   Chloride 105 96 - 112 mEq/L   CO2 28 19 - 32 mEq/L   Glucose, Bld 107 (H) 70 - 99 mg/dL   BUN 24 (H) 6 - 23 mg/dL   Creatinine, Ser 0.90 0.40 - 1.20 mg/dL   Calcium 9.6 8.4 - 10.5 mg/dL   GFR 68.19 >60.00 mL/min  Urine  Microalbumin w/creat. ratio     Status: Abnormal   Collection Time: 12/14/14  8:11 AM  Result Value Ref Range   Microalb, Ur 2.3 (H) 0.0 - 1.9 mg/dL   Creatinine,U 100.4 mg/dL   Microalb Creat Ratio 2.3 0.0 - 30.0 mg/g    Assessment/Plan: Acute bronchitis Rx Azithromycin.  Increase fluids.  Rest.  Saline nasal spray.  Probiotic.  Delsym as directed.  Humidifier in bedroom.  Call or return to clinic if symptoms are not improving.

## 2015-02-08 NOTE — Assessment & Plan Note (Signed)
Rx Azithromycin.  Increase fluids.  Rest.  Saline nasal spray.  Probiotic.  Delsym as directed.  Humidifier in bedroom.  Call or return to clinic if symptoms are not improving.

## 2015-02-08 NOTE — Patient Instructions (Signed)
Take antibiotic (Azithromycin) as directed.  Increase fluids.  Get plenty of rest. Continue Delsym for cough. Take a daily probiotic (I recommend Align or Culturelle, but even Activia Yogurt may be beneficial).  A humidifier placed in the bedroom may offer some relief for a dry, scratchy throat of nasal irritation.  Read information below on acute bronchitis. Please call or return to clinic if symptoms are not improving.  Acute Bronchitis Bronchitis is when the airways that extend from the windpipe into the lungs get red, puffy, and painful (inflamed). Bronchitis often causes thick spit (mucus) to develop. This leads to a cough. A cough is the most common symptom of bronchitis. In acute bronchitis, the condition usually begins suddenly and goes away over time (usually in 2 weeks). Smoking, allergies, and asthma can make bronchitis worse. Repeated episodes of bronchitis may cause more lung problems.  HOME CARE  Rest.  Drink enough fluids to keep your pee (urine) clear or pale yellow (unless you need to limit fluids as told by your doctor).  Only take over-the-counter or prescription medicines as told by your doctor.  Avoid smoking and secondhand smoke. These can make bronchitis worse. If you are a smoker, think about using nicotine gum or skin patches. Quitting smoking will help your lungs heal faster.  Reduce the chance of getting bronchitis again by:  Washing your hands often.  Avoiding people with cold symptoms.  Trying not to touch your hands to your mouth, nose, or eyes.  Follow up with your doctor as told.  GET HELP IF: Your symptoms do not improve after 1 week of treatment. Symptoms include:  Cough.  Fever.  Coughing up thick spit.  Body aches.  Chest congestion.  Chills.  Shortness of breath.  Sore throat.  GET HELP RIGHT AWAY IF:   You have an increased fever.  You have chills.  You have severe shortness of breath.  You have bloody thick spit  (sputum).  You throw up (vomit) often.  You lose too much body fluid (dehydration).  You have a severe headache.  You faint.  MAKE SURE YOU:   Understand these instructions.  Will watch your condition.  Will get help right away if you are not doing well or get worse. Document Released: 09/19/2007 Document Revised: 12/03/2012 Document Reviewed: 09/23/2012 [facility] Patient Information 2015 [place], [place]. This information is not intended to replace advice given to you by your health care provider. Make sure you discuss any questions you have with your health care provider.

## 2015-02-22 ENCOUNTER — Other Ambulatory Visit: Payer: Self-pay | Admitting: Family

## 2015-02-22 ENCOUNTER — Other Ambulatory Visit: Payer: Self-pay

## 2015-02-22 MED ORDER — VALSARTAN 160 MG PO TABS: ORAL_TABLET | ORAL | Status: DC

## 2015-02-22 NOTE — Telephone Encounter (Signed)
Caller name: Volanda Napoleon  Relationship to patient: Husband  Can be reached: [phone] [facility]) Pharmacy: [id number], [address] [phone] (Phone) [phone] (Fax)         Reason for call: Refill on valsartan (DIOVAN) 160 MG tablet [[id number]]

## 2015-02-22 NOTE — Telephone Encounter (Signed)
Medication refilled per patient request.  

## 2015-02-24 ENCOUNTER — Telehealth: Payer: Self-pay | Admitting: Family

## 2015-02-24 MED ORDER — FENOFIBRATE 160 MG PO TABS: 160.0000 mg | ORAL_TABLET | Freq: Every day | ORAL | Status: DC

## 2015-02-24 NOTE — Telephone Encounter (Signed)
Called patient's husband back and informed him of below. Patient did not want to schedule a f/u at this time/DLS

## 2015-02-24 NOTE — Telephone Encounter (Signed)
Refill sent to pharmacy. Pt is due for fasting follow up on 03/15/15. Please call pt to schedule appt as he has not made f/u yet. Thanks!

## 2015-02-24 NOTE — Telephone Encounter (Signed)
Caller name: Volanda Napoleon  Relationship to patient: Husband  Can be reached: [phone]  Pharmacy: [place], [place] [phone] (Phone) [phone] (Fax)         Reason for call: Request refill on  fenofibrate 160 MG tablet [id number]

## 2015-04-04 ENCOUNTER — Encounter: Payer: Self-pay | Admitting: Family

## 2015-04-04 ENCOUNTER — Ambulatory Visit (INDEPENDENT_AMBULATORY_CARE_PROVIDER_SITE_OTHER): Payer: 59 | Admitting: Family

## 2015-04-04 ENCOUNTER — Encounter (INDEPENDENT_AMBULATORY_CARE_PROVIDER_SITE_OTHER): Payer: Self-pay

## 2015-04-04 VITALS — BP 100/70 | HR 68 | Temp 97.9°F | Resp 14 | Ht 61.0 in | Wt 141.6 lb

## 2015-04-04 DIAGNOSIS — E119 Type 2 diabetes mellitus without complications: Secondary | ICD-10-CM

## 2015-04-04 DIAGNOSIS — I10 Essential (primary) hypertension: Secondary | ICD-10-CM

## 2015-04-04 DIAGNOSIS — F32A Depression, unspecified: Secondary | ICD-10-CM

## 2015-04-04 DIAGNOSIS — E785 Hyperlipidemia, unspecified: Secondary | ICD-10-CM | POA: Diagnosis not present

## 2015-04-04 DIAGNOSIS — E781 Pure hyperglyceridemia: Secondary | ICD-10-CM

## 2015-04-04 DIAGNOSIS — F329 Major depressive disorder, single episode, unspecified: Secondary | ICD-10-CM

## 2015-04-04 LAB — LIPID PANEL
Cholesterol: 147 mg/dL (ref 0–200)
HDL: 60.7 mg/dL (ref >39.00)
LDL Cholesterol: 64 mg/dL (ref 0–99)
NONHDL: 86.61
Total CHOL/HDL Ratio: 2
Triglycerides: 111 mg/dL (ref 0.0–149.0)
VLDL: 22.2 mg/dL (ref 0.0–40.0)

## 2015-04-04 LAB — BASIC METABOLIC PANEL
BUN: 20 mg/dL (ref 6–23)
CHLORIDE: 105 meq/L (ref 96–112)
CO2: 28 meq/L (ref 19–32)
CREATININE: 0.83 mg/dL (ref 0.40–1.20)
Calcium: 9.8 mg/dL (ref 8.4–10.5)
GFR: 74.79 mL/min (ref >60.00)
Glucose, Bld: 107 mg/dL — ABNORMAL HIGH (ref 70–99)
Potassium: 3.9 mEq/L (ref 3.5–5.1)
SODIUM: 140 meq/L (ref 135–145)

## 2015-04-04 LAB — HEMOGLOBIN A1C: HEMOGLOBIN A1C: 6.7 % — AB (ref 4.6–6.5)

## 2015-04-04 MED ORDER — ONETOUCH ULTRASOFT LANCETS MISC: Status: DC

## 2015-04-04 MED ORDER — ESCITALOPRAM OXALATE 10 MG PO TABS: ORAL_TABLET | ORAL | Status: DC

## 2015-04-04 NOTE — Assessment & Plan Note (Signed)
Obtain follow up lipid panel.  

## 2015-04-04 NOTE — Patient Instructions (Signed)
Please complete lab work prior to leaving.   

## 2015-04-04 NOTE — Assessment & Plan Note (Signed)
Stable, obtain A1C.  

## 2015-04-04 NOTE — Progress Notes (Signed)
Pre visit review using our clinic review tool, if applicable. No additional management support is needed unless otherwise documented below in the visit note. 

## 2015-04-04 NOTE — Assessment & Plan Note (Signed)
BP stable on current meds, continue same,obtain bmet.  

## 2015-04-04 NOTE — Assessment & Plan Note (Signed)
Stable off of meds.  

## 2015-04-04 NOTE — Progress Notes (Signed)
Subjective:    Patient ID: Katherine Kelly, female    DOB: 1956/02/06, 60 y.o.   MRN: [id number]  HPI  Katherine Kelly is a 59 yr old female who presents today for follow up.  1) DM2- reports that she has been checking at home last time she checked was 116 Lab Results  Component Value Date   HGBA1C 6.8* 12/14/2014   HGBA1C 6.7* 08/10/2014   HGBA1C 6.7* 04/07/2014   Lab Results  Component Value Date   MICROALBUR 2.3* 12/14/2014   LDLCALC 69 12/31/2013   CREATININE 0.90 12/14/2014   2) Hyperlipidemia- maintained on statin and simvastatin, denies myalgia.    Lab Results  Component Value Date   CHOL 146 12/31/2013   HDL 53.70 12/31/2013   LDLCALC 69 12/31/2013   LDLDIRECT 64.9 12/17/2006   TRIG 118.0 12/31/2013   CHOLHDL 3 12/31/2013   3) Depression- reports mood is good, not using lexapro.    Review of Systems See HPI  Past Medical History  Diagnosis Date  . History of colonic polyps   . Diabetes mellitus, type 2 ([place]) 09/2006    no meds  . Hypertension   . ACE-inhibitor cough     Social History   Social History  . Marital Status: Married    Spouse Name: N/A  . Number of Children: N/A  . Years of Education: N/A   Occupational History  . Not on file.   Social History Main Topics  . Smoking status: Never Smoker   . Smokeless tobacco: Never Used  . Alcohol Use: No  . Drug Use: No  . Sexual Activity: Not on file   Other Topics Concern  . 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 Cesarean section  [place]  . Esophagogastroduodenoscopy  04/2006  . Cardiovascular stress test  11/08/2006  . Colonoscopy    . Polypectomy      Family History  Problem Relation Age of Onset  . Colon cancer Neg Hx   . Rectal cancer Neg Hx   . Stomach cancer Neg Hx     Allergies    Allergen Reactions  . Ace Inhibitors     weakness    Current Outpatient Prescriptions on File Prior to Visit  Medication Sig Dispense Refill  . aspirin EC 81 MG tablet Take 81 mg by mouth daily.    . cholecalciferol (VITAMIN D) 1000 UNITS tablet Take 1,000 Units by mouth daily.    . Clobetasol Prop Emollient Base 0.05 % emollient cream Apply 1 application topically 2 (two) times daily as needed. 30 g 1  . fenofibrate 160 MG tablet Take 1 tablet (160 mg total) by mouth daily. 90 tablet 0  . fluticasone (FLONASE) 50 MCG/ACT nasal spray Place 2 sprays into the nose as needed.    Marland Kitchen ibuprofen (ADVIL,MOTRIN) 200 MG tablet Take 200 mg by mouth every 6 (six) hours as needed.    . simvastatin (ZOCOR) 10 MG tablet Take 1 tablet (10 mg total) by mouth at bedtime. 90 tablet 1  . valsartan (DIOVAN) 160 MG tablet TAKE ONE TABLET BY MOUTH TWICE DAILY 180 tablet 1   No current facility-administered medications on file prior to visit.    BP 100/70 mmHg  Pulse 68  Temp(Src) 97.9 F (36.6 C) (Oral)  Resp 14  Ht 5\' 1"  (1.549 m)  Wt 141 lb 9.6 oz (64.229 kg)  BMI 26.77 kg/m2  SpO2 99%       Objective:   Physical Exam  Constitutional: She is oriented to person, place, and time. She appears well-developed and well-nourished.  HENT:  Head: Normocephalic and atraumatic.  Cardiovascular: Normal rate, regular rhythm and normal heart sounds.   No murmur heard. Pulmonary/Chest: Effort normal and breath sounds normal. No respiratory distress. She has no wheezes.  Musculoskeletal: She exhibits no edema.  Neurological: She is alert and oriented to person, place, and time.  Psychiatric: She has a normal mood and affect. Her behavior is normal. Judgment and thought content normal.          Assessment & Plan:

## 2015-05-02 ENCOUNTER — Telehealth: Payer: Self-pay | Admitting: *Deleted

## 2015-05-02 NOTE — Telephone Encounter (Signed)
PA submitted to [facility] [place] (member ID# [id number], [id number]) on [email or web address]. Awaiting determination. JG//CMA

## 2015-05-06 NOTE — Telephone Encounter (Signed)
PA approved effective 05/02/15 - 07/31/15. Ref#: [id number]. Approval letter sent for scanning. JG//CMA

## 2015-05-11 ENCOUNTER — Ambulatory Visit (INDEPENDENT_AMBULATORY_CARE_PROVIDER_SITE_OTHER): Payer: BLUE CROSS/BLUE SHIELD | Admitting: Medical

## 2015-05-11 ENCOUNTER — Encounter: Payer: Self-pay | Admitting: Medical

## 2015-05-11 VITALS — BP 102/70 | HR 80 | Temp 97.7°F | Ht 61.0 in | Wt 143.0 lb

## 2015-05-11 DIAGNOSIS — R0981 Nasal congestion: Secondary | ICD-10-CM

## 2015-05-11 DIAGNOSIS — J209 Acute bronchitis, unspecified: Secondary | ICD-10-CM | POA: Diagnosis not present

## 2015-05-11 DIAGNOSIS — R059 Cough, unspecified: Secondary | ICD-10-CM

## 2015-05-11 DIAGNOSIS — R05 Cough: Secondary | ICD-10-CM

## 2015-05-11 MED ORDER — AZITHROMYCIN 250 MG PO TABS: ORAL_TABLET | ORAL | Status: DC

## 2015-05-11 MED ORDER — BENZONATATE 100 MG PO CAPS: ORAL_CAPSULE | ORAL | Status: DC

## 2015-05-11 MED ORDER — FLUTICASONE PROPIONATE 50 MCG/ACT NA SUSP: 2.0000 | Freq: Every day | NASAL | Status: DC

## 2015-05-11 MED FILL — BENZONATATE 100 MG CAPSULE: 100 | 14 days supply | Qty: 14 | Fill #0

## 2015-05-11 MED FILL — FLUTICASONE PROP 50 MCG SPR: 50 | 30 days supply | Qty: 16 | Fill #0

## 2015-05-11 MED FILL — AZITHROMYCIN 250 MG TABLET: 250 | 5 days supply | Qty: 6 | Fill #0

## 2015-05-11 NOTE — Progress Notes (Signed)
Subjective:    Patient ID: Katherine Kelly, female    DOB: 1955-12-29, 60 y.o.   MRN: [id number]  HPI   Pt in with about 15 days of cough and nasal congestion. Husband sick and he got antibiotic yesterday. Pt has been coughing up mucous. Some rough breath sounds at times. No sinus pressure.   No fevers, chills or sweats.   Husband was hoping pt would get antibiotic.    Review of Systems  Constitutional: Negative for fever, chills and fatigue.  HENT: Positive for congestion. Negative for ear pain, sinus pressure and sore throat.   Respiratory: Positive for cough. Negative for chest tightness, shortness of breath and wheezing.   Cardiovascular: Negative for chest pain and palpitations.  Gastrointestinal: Negative for abdominal pain.  Skin: Negative for rash.  Neurological: Negative for dizziness and headaches.  Hematological: Negative for adenopathy. Does not bruise/bleed easily.  Psychiatric/Behavioral: Negative for behavioral problems and confusion.    Past Medical History  Diagnosis Date  . History of colonic polyps   . Diabetes mellitus, type 2 ([place]) 09/2006    no meds  . Hypertension   . ACE-inhibitor cough     Social History   Social History  . Marital Status: Married    Spouse Name: N/A  . Number of Children: N/A  . Years of Education: N/A   Occupational History  . Not on file.   Social History Main Topics  . Smoking status: Never Smoker   . Smokeless tobacco: Never Used  . Alcohol Use: No  . Drug Use: No  . Sexual Activity: Not on file   Other Topics Concern  . 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 Cesarean section  [place]  . Esophagogastroduodenoscopy  04/2006  . Cardiovascular stress test  11/08/2006  . Colonoscopy    . Polypectomy      Family  History  Problem Relation Age of Onset  . Colon cancer Neg Hx   . Rectal cancer Neg Hx   . Stomach cancer Neg Hx     Allergies  Allergen Reactions  . Ace Inhibitors     weakness    Current Outpatient Prescriptions on File Prior to Visit  Medication Sig Dispense Refill  . aspirin EC 81 MG tablet Take 81 mg by mouth daily.    . cholecalciferol (VITAMIN D) 1000 UNITS tablet Take 1,000 Units by mouth daily.    . Clobetasol Prop Emollient Base 0.05 % emollient cream Apply 1 application topically 2 (two) times daily as needed. 30 g 1  . fenofibrate 160 MG tablet Take 1 tablet (160 mg total) by mouth daily. 90 tablet 0  . fluticasone (FLONASE) 50 MCG/ACT nasal spray Place 2 sprays into the nose as needed.    Marland Kitchen ibuprofen (ADVIL,MOTRIN) 200 MG tablet Take 200 mg by mouth every 6 (six) hours as needed.    . Lancets (ONETOUCH ULTRASOFT) lancets Check BS bid  DX 250.00 200 each 0  . simvastatin (ZOCOR) 10 MG tablet Take 1 tablet (10 mg total) by mouth at bedtime. 90 tablet 1  . valsartan (DIOVAN) 160 MG tablet TAKE ONE TABLET BY MOUTH TWICE DAILY 180 tablet 1   No current facility-administered medications on file prior to visit.  BP 102/70 mmHg  Pulse 80  Temp(Src) 97.7 F (36.5 C) (Oral)  Ht 5\' 1"  (1.549 m)  Wt 143 lb (64.864 kg)  BMI 27.03 kg/m2  SpO2 96%       Objective:   Physical Exam  General  Mental Status - Alert. General Appearance - Well groomed. Not in acute distress.  Skin Rashes- No Rashes.  HEENT Head- Normal. Ear Auditory Canal - Left- Normal. Right - Normal.Tympanic Membrane- Left- Normal. Right- Normal. Eye Sclera/Conjunctiva- Left- Normal. Right- Normal. Nose & Sinuses Nasal Mucosa- Left-  Faint boggy and  Congested. Right-  faint boggy and  Congested. Mouth & Throat Lips: Upper Lip- Normal: no dryness, cracking, pallor, cyanosis, or vesicular eruption. Lower Lip-Normal: no dryness, cracking, pallor, cyanosis or vesicular eruption. Buccal Mucosa-  Bilateral- No Aphthous ulcers. Oropharynx- No Discharge or Erythema. Tonsils: Characteristics- Bilateral- No Erythema or Congestion. Size/Enlargement- Bilateral- No enlargement. Discharge- bilateral-None.  Neck Neck- Supple. No Masses.   Chest and Lung Exam Auscultation: Breath Sounds:- even and unlabored, but faint bilateral upper lobe rhonchi.  Cardiovascular Auscultation:Rythm- Regular, rate and rhythm. Murmurs & Other Heart Sounds:Ausculatation of the heart reveal- No Murmurs.  Lymphatic Head & Neck General Head & Neck Lymphatics: Bilateral: Description- No Localized lymphadenopathy.       Assessment & Plan:  Rest and hydrate. For  fever tylenol.  For bronchitis prescribe azithromycin.  For cough benzonatate.  For nasal congestion flonase nasal spray.  Follow up in 7 days or as

## 2015-05-11 NOTE — Patient Instructions (Addendum)
Rest and hydrate. For  fever tylenol.  For bronchitis prescribe azithromycin.  For cough benzonatate.  For nasal congestion flonase nasal spray.  Follow up in 7 days or as needed

## 2015-05-11 NOTE — Progress Notes (Signed)
Pre visit review using our clinic review tool, if applicable. No additional management support is needed unless otherwise documented below in the visit note. 

## 2015-06-02 ENCOUNTER — Telehealth: Payer: Self-pay | Admitting: Family

## 2015-06-02 MED ORDER — FENOFIBRATE 160 MG PO TABS: 160.0000 mg | ORAL_TABLET | Freq: Every day | ORAL | Status: DC

## 2015-06-02 NOTE — Telephone Encounter (Signed)
Medication filled to pharmacy as requested.   

## 2015-06-02 NOTE — Telephone Encounter (Signed)
Relation to [id number] Call back [phone] Pharmacy: [place], [place] [phone] (Phone) [phone] (Fax)         Reason for call:  Spouse requesting a refill fenofibrate 160 MG tablet

## 2015-08-09 ENCOUNTER — Telehealth: Payer: Self-pay | Admitting: Family

## 2015-08-09 NOTE — Telephone Encounter (Signed)
Caller name: Grayland Ormond with [employer] Can be reached: [phone]  Fax #: [phone]   Reason for call: Call from [place] stating need for clinical info as to need for valsartan 160mg  2 per day vs 320mg  1 per day. Requested phone call or fax to above #s.

## 2015-08-09 NOTE — Telephone Encounter (Signed)
Renewal for PA initiated on [email or web address]. Awaiting determination from [employer] [place]. JG//CMA

## 2015-08-09 NOTE — Telephone Encounter (Signed)
  Please send 320 mg once daily instead. Tks

## 2015-08-09 NOTE — Telephone Encounter (Signed)
Melissa -- please see below and advise? 

## 2015-08-10 MED ORDER — VALSARTAN 320 MG PO TABS: 320.0000 mg | ORAL_TABLET | Freq: Every day | ORAL | Status: DC

## 2015-08-10 NOTE — Telephone Encounter (Signed)
Below # to insurance is incorrect. Sent Rx to pharmacy for 320mg  once daily and it went through system without any problem. Notified pt's spouse of direction change and he voices understanding.

## 2015-08-15 NOTE — Telephone Encounter (Signed)
PA approved for 2.0 tablets daily of Valsartan 160mg  tabs. Ref#: [id number]. Approval letter sent for scanning. JG//CMA

## 2015-09-05 ENCOUNTER — Telehealth: Payer: Self-pay | Admitting: Family

## 2015-09-05 MED ORDER — FENOFIBRATE 160 MG PO TABS: 160.0000 mg | ORAL_TABLET | Freq: Every day | ORAL | Status: DC

## 2015-09-05 NOTE — Telephone Encounter (Signed)
Caller name:Renne,Xua Relation to [id number]  Call back number: [phone]  Pharmacy: [place], [place] [address] [phone] (Phone) [phone] (Fax)         Reason for call:  Spouse requesting a refill fenofibrate 160 MG tablet.

## 2015-09-05 NOTE — Telephone Encounter (Signed)
Spoke with pt's spouse and advised him that pt was due for follow in 07/2015 with PCP and is past due. Sent 90 day supply. Spouse states he will have to check pt's schedule then call back to make appt. Advised him that we will not be able to given further refills until pt is seen and he voices understanding.

## 2015-10-25 ENCOUNTER — Encounter: Payer: Self-pay | Admitting: Family

## 2015-10-25 ENCOUNTER — Ambulatory Visit (INDEPENDENT_AMBULATORY_CARE_PROVIDER_SITE_OTHER): Payer: BLUE CROSS/BLUE SHIELD | Admitting: Family

## 2015-10-25 DIAGNOSIS — R109 Unspecified abdominal pain: Secondary | ICD-10-CM

## 2015-10-25 DIAGNOSIS — I499 Cardiac arrhythmia, unspecified: Secondary | ICD-10-CM | POA: Diagnosis not present

## 2015-10-25 DIAGNOSIS — R011 Cardiac murmur, unspecified: Secondary | ICD-10-CM

## 2015-10-25 DIAGNOSIS — N3000 Acute cystitis without hematuria: Secondary | ICD-10-CM | POA: Diagnosis not present

## 2015-10-25 DIAGNOSIS — E119 Type 2 diabetes mellitus without complications: Secondary | ICD-10-CM

## 2015-10-25 LAB — POC URINALSYSI DIPSTICK (AUTOMATED)
Bilirubin, UA: NEGATIVE
GLUCOSE UA: 500
Ketones, UA: NEGATIVE
Nitrite, UA: POSITIVE
Spec Grav, UA: >=1.03
UROBILINOGEN UA: 0.2
pH, UA: 6

## 2015-10-25 MED ORDER — NITROFURANTOIN MONOHYD MACRO 100 MG PO CAPS: 100.0000 mg | ORAL_CAPSULE | Freq: Two times a day (BID) | ORAL | Status: DC

## 2015-10-25 MED FILL — NITROFURANTOIN MONO-MCR 100: 100 | 7 days supply | Qty: 14 | Fill #0

## 2015-10-25 NOTE — Patient Instructions (Signed)
Begin macrobid (antibiotic) for urinary tract infection. Call if weakness, abdominal cramping worsens or if it does not improve in the next few day. You will be contacted about your echocardiogram (ultrasound to check your heart murmur).

## 2015-10-25 NOTE — Progress Notes (Signed)
Subjective:    Patient ID: Katherine Kelly, female    DOB: November 03, 1955, 60 y.o.   MRN: [id number]  HPI  Katherine Kelly is a 60 yr old female who presents today to discuss several concerns.    1) Back pain-  Pain is located on the left lower back.  Pain began on 10/21/15. Tried advil which helped her pain. Reports that her back pain is resolved. Denies dysuria, frequency or hematuria, or urinary odor.    2) Abdominal pain- reports that this also began on Friday 7/7. Has associated fatigue.  Still had stomach cramping on Saturday.  Today abdominal discomfort is improved, just feels like her abdomen is "heavy and tight." Weakness continues. Last BM was at 10 AM.     Review of Systems    see HPI  Past Medical History  Diagnosis Date  . History of colonic polyps   . Diabetes mellitus, type 2 ([place]) 09/2006    no meds  . Hypertension   . ACE-inhibitor cough      Social History   Social History  . Marital Status: Married    Spouse Name: N/A  . Number of Children: N/A  . Years of Education: N/A   Occupational History  . Not on file.   Social History Main Topics  . Smoking status: Never Smoker   . Smokeless tobacco: Never Used  . Alcohol Use: No  . Drug Use: No  . Sexual Activity: Not on file   Other Topics Concern  . Not on file   Social History Narrative   Lives with husband and daughter   Freight forwarder at [employer]   No pets   Did not complete high school   Enjoys travel and shopping    Past Surgical History  Procedure Laterality Date  . Cesarean section  [place]  . Esophagogastroduodenoscopy  04/2006  . Cardiovascular stress test  11/08/2006  . Colonoscopy    . Polypectomy      Family History  Problem Relation Age of Onset  . Colon cancer Neg Hx   . Rectal cancer Neg Hx   . Stomach cancer Neg Hx     Allergies  Allergen Reactions  . Ace Inhibitors     weakness    Current Outpatient Prescriptions on File Prior to Visit  Medication Sig Dispense Refill  .  Lancets (ONETOUCH ULTRASOFT) lancets Check BS bid  DX 250.00 200 each 0  . simvastatin (ZOCOR) 10 MG tablet Take 1 tablet (10 mg total) by mouth at bedtime. 90 tablet 1  . valsartan (DIOVAN) 320 MG tablet Take 1 tablet (320 mg total) by mouth daily. 90 tablet 0  . aspirin EC 81 MG tablet Take 81 mg by mouth daily. Reported on 10/25/2015    . cholecalciferol (VITAMIN D) 1000 UNITS tablet Take 1,000 Units by mouth daily. Reported on 10/25/2015    . Clobetasol Prop Emollient Base 0.05 % emollient cream Apply 1 application topically 2 (two) times daily as needed. (Patient not taking: Reported on 10/25/2015) 30 g 1  . fenofibrate 160 MG tablet Take 1 tablet (160 mg total) by mouth daily. (Patient not taking: Reported on 10/25/2015) 90 tablet 0  . fluticasone (FLONASE) 50 MCG/ACT nasal spray Place 2 sprays into the nose as needed. Reported on 10/25/2015    . fluticasone (FLONASE) 50 MCG/ACT nasal spray Place 2 sprays into both nostrils daily. (Patient not taking: Reported on 10/25/2015) 16 g 1  . ibuprofen (ADVIL,MOTRIN) 200 MG tablet  Take 200 mg by mouth every 6 (six) hours as needed. Reported on 10/25/2015     No current facility-administered medications on file prior to visit.    BP 100/70 mmHg  Pulse 89  Temp(Src) 98.4 F (36.9 C) (Oral)  Ht 5\' 2"  (1.575 m)  Wt 142 lb 12.8 oz (64.774 kg)  BMI 26.11 kg/m2  SpO2 98%    Objective:   Physical Exam  Constitutional: She is oriented to person, place, and time. She appears well-developed and well-nourished.  Cardiovascular:  Murmur heard. Mild irregular heart rate noted. + JVD  Pulmonary/Chest: Effort normal and breath sounds normal. No respiratory distress. She has no wheezes.  Abdominal: Soft. She exhibits no distension and no mass. There is no tenderness. There is no rebound and no guarding.  Neurological: She is alert and oriented to person, place, and time.  Skin: Skin is warm and dry.  Psychiatric: She has a normal mood and affect. Her  behavior is normal. Judgment and thought content normal.          Assessment & Plan:  Murmur/JVD- these are new findings.  Will obtain a 2D echo to further evaluate.  EKG tracing is personally reviewed.  EKG notes NSR.  No acute changes.   UTI- UA +leuks, likely UTI given fatigue and lower abdominal cramping. Will initiate macrobid.  Pt and husband are instructed to call if she develops new/worsening symptoms or if symptoms do not improve.   DM2- due for A1C.

## 2015-10-25 NOTE — Progress Notes (Signed)
Pre visit review using our clinic review tool, if applicable. No additional management support is needed unless otherwise documented below in the visit note. 

## 2015-10-26 LAB — HEPATIC FUNCTION PANEL
ALT: 39 U/L — ABNORMAL HIGH (ref 0–35)
AST: 35 U/L (ref 0–37)
Albumin: 3.7 g/dL (ref 3.5–5.2)
Alkaline Phosphatase: 74 U/L (ref 39–117)
BILIRUBIN TOTAL: 0.7 mg/dL (ref 0.2–1.2)
Bilirubin, Direct: 0.3 mg/dL (ref 0.0–0.3)
Total Protein: 7.4 g/dL (ref 6.0–8.3)

## 2015-10-26 LAB — CBC WITH DIFFERENTIAL/PLATELET
BASOS ABS: 0 10*3/uL (ref 0.0–0.1)
Basophils Relative: 0.3 % (ref 0.0–3.0)
EOS ABS: 0.2 10*3/uL (ref 0.0–0.7)
Eosinophils Relative: 2.4 % (ref 0.0–5.0)
HCT: 35.8 % — ABNORMAL LOW (ref 36.0–46.0)
Hemoglobin: 12.1 g/dL (ref 12.0–15.0)
LYMPHS ABS: 1 10*3/uL (ref 0.7–4.0)
Lymphocytes Relative: 11.6 % — ABNORMAL LOW (ref 12.0–46.0)
MCHC: 34 g/dL (ref 30.0–36.0)
MCV: 95.8 fl (ref 78.0–100.0)
MONO ABS: 0.8 10*3/uL (ref 0.1–1.0)
Monocytes Relative: 8.7 % (ref 3.0–12.0)
NEUTROS ABS: 6.7 10*3/uL (ref 1.4–7.7)
Neutrophils Relative %: 77 % (ref 43.0–77.0)
PLATELETS: 147 10*3/uL — AB (ref 150.0–400.0)
RBC: 3.74 Mil/uL — ABNORMAL LOW (ref 3.87–5.11)
RDW: 14.5 % (ref 11.5–15.5)
WBC: 8.6 10*3/uL (ref 4.0–10.5)

## 2015-10-26 LAB — BASIC METABOLIC PANEL
BUN: 22 mg/dL (ref 6–23)
CO2: 28 mEq/L (ref 19–32)
CREATININE: 1.16 mg/dL (ref 0.40–1.20)
Calcium: 9.4 mg/dL (ref 8.4–10.5)
Chloride: 106 mEq/L (ref 96–112)
GFR: 50.72 mL/min — ABNORMAL LOW (ref >60.00)
Glucose, Bld: 207 mg/dL — ABNORMAL HIGH (ref 70–99)
POTASSIUM: 3.8 meq/L (ref 3.5–5.1)
Sodium: 142 mEq/L (ref 135–145)

## 2015-10-26 LAB — HEMOGLOBIN A1C: HEMOGLOBIN A1C: 6.9 % — AB (ref 4.6–6.5)

## 2015-10-27 ENCOUNTER — Telehealth: Payer: Self-pay | Admitting: Family

## 2015-10-27 NOTE — Telephone Encounter (Signed)
Called patient spoke with husband who states patient is feeling some better but it was too early to tell. Advised to call office if condition worsens.

## 2015-10-27 NOTE — Telephone Encounter (Signed)
Lab work shows UTI as expected. She should complete antibiotics (macrobid). How is she feeling? Sugar is at goal.  One of her liver function tests is very mildly elevated. We will keep an eye on this.

## 2015-10-27 NOTE — Telephone Encounter (Signed)
Please advise 

## 2015-10-27 NOTE — Telephone Encounter (Signed)
Caller name: Almyra Free  Relation to [id number] Call back number: [phone]    Reason for call:  Inquiring about lab results.

## 2015-10-29 LAB — URINE CULTURE

## 2015-10-31 ENCOUNTER — Telehealth: Payer: Self-pay | Admitting: Family

## 2015-10-31 DIAGNOSIS — Z1239 Encounter for other screening for malignant neoplasm of breast: Secondary | ICD-10-CM

## 2015-10-31 MED ORDER — FENOFIBRATE 160 MG PO TABS: 160.0000 mg | ORAL_TABLET | Freq: Every day | ORAL | Status: DC

## 2015-10-31 MED ORDER — VALSARTAN 320 MG PO TABS: 320.0000 mg | ORAL_TABLET | Freq: Every day | ORAL | Status: DC

## 2015-10-31 NOTE — Telephone Encounter (Signed)
OK to put in a screening mammogram please here. Since her biopsy was negative, I believe that they will resume screening mammograms.

## 2015-10-31 NOTE — Telephone Encounter (Signed)
Relation to [id number] Call back number:[phone] Pharmacy: [facility], [place] [address] [phone] (Fax)         Reason for call:  Patient requesting a refill fenofibrate 160 MG tablet and valsartan (DIOVAN) 320 MG tablet and requesting mamo orders for [facility].

## 2015-10-31 NOTE — Telephone Encounter (Signed)
Melissa-- refills have been sent but please advise if pt should had mammogram done at the [facility] or [facility] if they [place] since last mammogram was abnormal?

## 2015-11-01 NOTE — Telephone Encounter (Signed)
Order placed. Notified pt's spouse. He states pt decided she wants to wait to schedule mammogram. Advised spouse I will leave order in system and gave him # to contact to schedule mammogram when pt is ready. Needs to be after 11/12/15 for 1 year from last mammogram. Spouse voices understanding.

## 2015-11-02 ENCOUNTER — Telehealth: Payer: Self-pay | Admitting: Family

## 2015-11-02 ENCOUNTER — Ambulatory Visit (HOSPITAL_BASED_OUTPATIENT_CLINIC_OR_DEPARTMENT_OTHER): Payer: BLUE CROSS/BLUE SHIELD

## 2015-11-02 DIAGNOSIS — R928 Other abnormal and inconclusive findings on diagnostic imaging of breast: Secondary | ICD-10-CM

## 2015-11-02 NOTE — Telephone Encounter (Signed)
-----   Message from Katha Hamming sent at 11/01/2015 12:59 PM EDT ----- Regarding: mammogram The patient's spouse called to make a screening mammogram appointment.  The order is in for a screening.  According to Barbaraann Barthel our mammogram tech.  The patient should have went back in December for another six month diagnostic mammogram of the left breast.  This should be scheduled at the [facility].  I told the spouse he would be contacted by your office.  Thanks, Hoyle Sauer

## 2015-11-03 ENCOUNTER — Telehealth: Payer: Self-pay | Admitting: Family

## 2015-11-03 ENCOUNTER — Ambulatory Visit (HOSPITAL_BASED_OUTPATIENT_CLINIC_OR_DEPARTMENT_OTHER)
Admission: RE | Admit: 2015-11-03 | Discharge: 2015-11-03 | Disposition: A | Discharge status: Home / Self Care | Payer: BLUE CROSS/BLUE SHIELD | Source: Ambulatory Visit | Attending: Family | Admitting: Family

## 2015-11-03 ENCOUNTER — Ambulatory Visit (INDEPENDENT_AMBULATORY_CARE_PROVIDER_SITE_OTHER): Payer: BLUE CROSS/BLUE SHIELD | Admitting: Family

## 2015-11-03 VITALS — BP 110/80 | HR 77 | Temp 97.6°F | Resp 18

## 2015-11-03 DIAGNOSIS — R1032 Left lower quadrant pain: Secondary | ICD-10-CM

## 2015-11-03 DIAGNOSIS — I119 Hypertensive heart disease without heart failure: Secondary | ICD-10-CM | POA: Insufficient documentation

## 2015-11-03 DIAGNOSIS — K5730 Diverticulosis of large intestine without perforation or abscess without bleeding: Secondary | ICD-10-CM | POA: Insufficient documentation

## 2015-11-03 DIAGNOSIS — R109 Unspecified abdominal pain: Secondary | ICD-10-CM | POA: Diagnosis not present

## 2015-11-03 DIAGNOSIS — M545 Low back pain: Secondary | ICD-10-CM | POA: Diagnosis not present

## 2015-11-03 DIAGNOSIS — R0602 Shortness of breath: Secondary | ICD-10-CM | POA: Diagnosis not present

## 2015-11-03 DIAGNOSIS — E119 Type 2 diabetes mellitus without complications: Secondary | ICD-10-CM | POA: Insufficient documentation

## 2015-11-03 DIAGNOSIS — R5382 Chronic fatigue, unspecified: Secondary | ICD-10-CM | POA: Diagnosis not present

## 2015-11-03 DIAGNOSIS — R011 Cardiac murmur, unspecified: Secondary | ICD-10-CM | POA: Insufficient documentation

## 2015-11-03 LAB — D-DIMER, QUANTITATIVE (NOT AT ARMC): D DIMER QUANT: 0.47 ug{FEU}/mL (ref <0.50)

## 2015-11-03 LAB — POCT URINALYSIS DIPSTICK
BILIRUBIN UA: NEGATIVE
Blood, UA: NEGATIVE
Glucose, UA: NEGATIVE
Ketones, UA: NEGATIVE
LEUKOCYTES UA: NEGATIVE
NITRITE UA: NEGATIVE
Protein, UA: NEGATIVE
Spec Grav, UA: 1.025
Urobilinogen, UA: NEGATIVE
pH, UA: 6.5

## 2015-11-03 LAB — ECHOCARDIOGRAM COMPLETE

## 2015-11-03 MED ORDER — IOPAMIDOL (ISOVUE-300) INJECTION 61%
100.0000 mL | Freq: Once | INTRAVENOUS | Status: AC | PRN
Start: 2015-11-03 — End: 2015-11-03
  Administered 2015-11-03: 100 mL via INTRAVENOUS

## 2015-11-03 NOTE — Patient Instructions (Addendum)
Please complete lab work prior to leaving. Complete your heart ultrasound on the first floor in imaging.   Complete CT scan on the first floor.   Follow up in 2 weeks, sooner if problems/concerns.

## 2015-11-03 NOTE — Telephone Encounter (Signed)
Relation to [id number] Call back number:[phone] Pharmacy: [facility], [place] [phone] (Fax)         Reason for call:  Son requesting blood pressure  (unaware of Rx name) and cholesterol (unaware of RX name) medication please send to retail

## 2015-11-03 NOTE — Progress Notes (Signed)
Pre visit review using our clinic review tool, if applicable. No additional management support is needed unless otherwise documented below in the visit note. 

## 2015-11-03 NOTE — Progress Notes (Signed)
Subjective:    Patient ID: Katherine Kelly, female    DOB: 07/09/1955, 60 y.o.   MRN: [id number]  HPI  Katherine Kelly is 60 yr old female who presents today with chief complaint of low back pain. Her son is present today and assists with interpretation. Pain has been present for 2 weeks.  She was seen in our office on 10/25/15 with L sided low back pain, abdominal pain and fatigue.  She was treated for a UTI with macrodantin.  She reports today that the worst symptom is fatigue.  She denies fever.  Denies dysuria.  Reports back pain is intermittent. Usually on the left but last night had some right lower abdominal pain. Denies nausea, or hematuria.  She reports stomach is "uncomfortable" like a rumbling but no real pain.  Reports BM's are normal. Denies black or bloody stools.    Reports some mild sob. Reports that sob is not worsened by exertion.  Describes "labored breathing." Denies PND.  Wt Readings from Last 3 Encounters:  10/25/15 142 lb 12.8 oz (64.774 kg)  05/11/15 143 lb (64.864 kg)  04/04/15 141 lb 9.6 oz (64.229 kg)     Review of Systems    see HPI  Past Medical History  Diagnosis Date  . History of colonic polyps   . Diabetes mellitus, type 2 ([place]) 09/2006    no meds  . Hypertension   . ACE-inhibitor cough      Social History   Social History  . Marital Status: Married    Spouse Name: N/A  . Number of Children: N/A  . Years of Education: N/A   Occupational History  . Not on file.   Social History Main Topics  . Smoking status: Never Smoker   . Smokeless tobacco: Never Used  . Alcohol Use: No  . Drug Use: No  . Sexual Activity: Not on file   Other Topics Concern  . Not on file   Social History Narrative   Lives with husband and daughter   Freight forwarder at [employer]   No pets   Did not complete high school   Enjoys travel and shopping    Past Surgical History  Procedure Laterality Date  . Cesarean section  [place]  . Esophagogastroduodenoscopy   04/2006  . Cardiovascular stress test  11/08/2006  . Colonoscopy    . Polypectomy      Family History  Problem Relation Age of Onset  . Colon cancer Neg Hx   . Rectal cancer Neg Hx   . Stomach cancer Neg Hx     Allergies  Allergen Reactions  . Ace Inhibitors     weakness    Current Outpatient Prescriptions on File Prior to Visit  Medication Sig Dispense Refill  . aspirin EC 81 MG tablet Take 81 mg by mouth daily. Reported on 10/25/2015    . cholecalciferol (VITAMIN D) 1000 UNITS tablet Take 1,000 Units by mouth daily. Reported on 10/25/2015    . Clobetasol Prop Emollient Base 0.05 % emollient cream Apply 1 application topically 2 (two) times daily as needed. 30 g 1  . fenofibrate 160 MG tablet Take 1 tablet (160 mg total) by mouth daily. 90 tablet 1  . ibuprofen (ADVIL,MOTRIN) 200 MG tablet Take 200 mg by mouth every 6 (six) hours as needed. Reported on 10/25/2015    . Lancets (ONETOUCH ULTRASOFT) lancets Check BS bid  DX 250.00 200 each 0  . nitrofurantoin, macrocrystal-monohydrate, (MACROBID) 100 MG capsule Take  1 capsule (100 mg total) by mouth 2 (two) times daily. 14 capsule 0  . simvastatin (ZOCOR) 10 MG tablet Take 1 tablet (10 mg total) by mouth at bedtime. 90 tablet 1  . valsartan (DIOVAN) 320 MG tablet Take 1 tablet (320 mg total) by mouth daily. 90 tablet 1   No current facility-administered medications on file prior to visit.    BP 110/80 mmHg  Pulse 77  Temp(Src) 97.6 F (36.4 C) (Oral)  Resp 18  SpO2 95%    Objective:   Physical Exam  Constitutional: She appears well-developed and well-nourished.  Cardiovascular: Normal rate and regular rhythm.   Murmur heard. Pulmonary/Chest: Effort normal and breath sounds normal. No respiratory distress. She has no wheezes.  Abdominal: Soft. There is tenderness in the left lower quadrant.  Genitourinary:  Negative CVAT bilaterally.   Musculoskeletal: She exhibits no edema.  Psychiatric: She has a normal mood and  affect. Her behavior is normal. Judgment and thought content normal.          Assessment & Plan:  SOB- I am concerned about valvular disorder given new finding of murmur. She is scheduled for a 2D echo this afternoon. I will obtain a d dimer to exclude PE as well as a chest x ray.   LLQ abdominal pain- will obtain CT abd/pelvis to rule out abdominal pathology and evaluate kidneys due to ongoing low back pain.  UA is obtained today and is clear. Will send for culture.    Fatigue- obtain cbc diff, TSH, CMET for further evaluation.   Case reviewed with Dr. Charlett Blake.

## 2015-11-03 NOTE — Progress Notes (Signed)
  Echocardiogram 2D Echocardiogram has been performed.  Katherine Kelly 11/03/2015, 3:49 PM

## 2015-11-04 ENCOUNTER — Other Ambulatory Visit: Payer: Self-pay | Admitting: Family

## 2015-11-04 ENCOUNTER — Other Ambulatory Visit: Payer: Self-pay | Admitting: Emergency Medicine

## 2015-11-04 ENCOUNTER — Telehealth: Payer: Self-pay

## 2015-11-04 DIAGNOSIS — M545 Low back pain: Secondary | ICD-10-CM | POA: Diagnosis not present

## 2015-11-04 LAB — CBC WITH DIFFERENTIAL/PLATELET
Basophils Absolute: 0 10*3/uL (ref 0.0–0.1)
Basophils Relative: 0.4 % (ref 0.0–3.0)
Eosinophils Absolute: 0.2 10*3/uL (ref 0.0–0.7)
Eosinophils Relative: 2.6 % (ref 0.0–5.0)
HEMATOCRIT: 37.1 % (ref 36.0–46.0)
HEMOGLOBIN: 12.4 g/dL (ref 12.0–15.0)
Lymphocytes Relative: 25.5 % (ref 12.0–46.0)
Lymphs Abs: 2.4 10*3/uL (ref 0.7–4.0)
MCHC: 33.5 g/dL (ref 30.0–36.0)
MCV: 95.3 fl (ref 78.0–100.0)
MONOS PCT: 5.6 % (ref 3.0–12.0)
Monocytes Absolute: 0.5 10*3/uL (ref 0.1–1.0)
NEUTROS ABS: 6.2 10*3/uL (ref 1.4–7.7)
Neutrophils Relative %: 65.9 % (ref 43.0–77.0)
PLATELETS: 408 10*3/uL — AB (ref 150.0–400.0)
RBC: 3.89 Mil/uL (ref 3.87–5.11)
RDW: 13.6 % (ref 11.5–15.5)
WBC: 9.4 10*3/uL (ref 4.0–10.5)

## 2015-11-04 LAB — COMPREHENSIVE METABOLIC PANEL
ALT: 16 U/L (ref 0–35)
AST: 19 U/L (ref 0–37)
Albumin: 4.3 g/dL (ref 3.5–5.2)
Alkaline Phosphatase: 59 U/L (ref 39–117)
BILIRUBIN TOTAL: 0.5 mg/dL (ref 0.2–1.2)
BUN: 27 mg/dL — AB (ref 6–23)
CO2: 25 meq/L (ref 19–32)
CREATININE: 0.96 mg/dL (ref 0.40–1.20)
Calcium: 10.1 mg/dL (ref 8.4–10.5)
Chloride: 102 mEq/L (ref 96–112)
GFR: 63.1 mL/min (ref >60.00)
GLUCOSE: 129 mg/dL — AB (ref 70–99)
Potassium: 4.1 mEq/L (ref 3.5–5.1)
SODIUM: 138 meq/L (ref 135–145)
TOTAL PROTEIN: 8.3 g/dL (ref 6.0–8.3)

## 2015-11-04 LAB — TSH: TSH: 1.2 u[IU]/mL (ref 0.35–4.50)

## 2015-11-04 MED ORDER — CIPROFLOXACIN HCL 250 MG PO TABS
250.0000 mg | ORAL_TABLET | Freq: Two times a day (BID) | ORAL | Status: DC
Start: 2015-11-04 — End: 2016-06-22

## 2015-11-04 NOTE — Telephone Encounter (Signed)
?   pyleonephritis-- pt is on abx-- make sure pt is taking abx--  If feeling worse she may need to go to ER

## 2015-11-04 NOTE — Telephone Encounter (Signed)
Pt completed macrodantin. Will rx with cipro. Spoke with husband. Advised him that i would like pt to start cipro. He states patient is feeling a  little better. Please contact pt to arrange follow up before Wednesday if next week.

## 2015-11-04 NOTE — Telephone Encounter (Signed)
Report called.  Impression below.  IMPRESSION: Decreased attenuation on delayed images in the medial mid to lower pole left kidney. This finding is quite subtle on the more immediate postcontrast images. Suspect localized pyelonephritis in the medial left kidney without well-defined abscess. A well-defined mass is not seen in this area.  There are sigmoid diverticula without diverticulitis. No bowel obstruction. No abscess. Appendix appears normal.  No renal or ureteral calculi. No hydronephrosis.  Lenna Sciara is out of the office today.  Please advise.

## 2015-11-07 LAB — URINE CULTURE: Colony Count: >=100000

## 2015-11-07 MED ORDER — SIMVASTATIN 10 MG PO TABS: 10.0000 mg | ORAL_TABLET | Freq: Every day | ORAL | 1 refills | Status: DC

## 2015-11-07 NOTE — Telephone Encounter (Signed)
Valsartan and fenofibrate sent to pharmacy on 10/31/15. Simvastatin was not sent. Simvastatin refill sent today. Verified with pharmacist that fenofibrate was received and placed on hold as pt not due for refill yet, valsartan was received on 11/04/15 and ready for pick up.  Notified pt's son.

## 2015-11-07 NOTE — Telephone Encounter (Signed)
Pt already scheduled for f/u with PCP tomorrow (11/08/15)

## 2015-11-07 NOTE — Telephone Encounter (Signed)
Appt scheduled for tomorrow (11/08/15) at 9:45 am with Debbrah Alar, NP.

## 2015-11-08 ENCOUNTER — Ambulatory Visit (INDEPENDENT_AMBULATORY_CARE_PROVIDER_SITE_OTHER): Payer: BLUE CROSS/BLUE SHIELD | Admitting: Family

## 2015-11-08 VITALS — BP 100/80 | HR 66 | Temp 98.5°F | Ht 61.0 in | Wt 138.4 lb

## 2015-11-08 DIAGNOSIS — N12 Tubulo-interstitial nephritis, not specified as acute or chronic: Secondary | ICD-10-CM

## 2015-11-08 NOTE — Progress Notes (Signed)
Pre visit review using our clinic review tool, if applicable. No additional management support is needed unless otherwise documented below in the visit note. 

## 2015-11-08 NOTE — Progress Notes (Signed)
Subjective:    Patient ID: Katherine Kelly, female    DOB: 10-26-1955, 60 y.o.   MRN: [id number]  HPI  Katherine Kelly is a 60 yr old female who presents today for follow up.  Last visit she reported low back pain. CT of the abd/pelvis noted ? Mild left pyleonephritis. She was started on cipro.  Follow up urine culture was + for >100K ecoli (cipro sensitive).  The patient reports resolution of back pain.  Feeling much better.    Review of Systems    see HPI  Past Medical History:  Diagnosis Date  . ACE-inhibitor cough   . Diabetes mellitus, type 2 ([place]) 09/2006   no meds  . History of colonic polyps   . Hypertension      Social History   Social History  . Marital status: Married    Spouse name: N/A  . Number of children: N/A  . Years of education: N/A   Occupational History  . Not on file.   Social History Main Topics  . Smoking status: Never Smoker  . Smokeless tobacco: Never Used  . Alcohol use No  . Drug use: No  . Sexual activity: Not on file   Other Topics Concern  . 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 CARDIOVASCULAR STRESS TEST  11/08/2006  . [place]  . COLONOSCOPY    . ESOPHAGOGASTRODUODENOSCOPY  04/2006  . POLYPECTOMY      Family History  Problem Relation Age of Onset  . Colon cancer Neg Hx   . Rectal cancer Neg Hx   . Stomach cancer Neg Hx     Allergies  Allergen Reactions  . Ace Inhibitors     weakness    Current Outpatient Prescriptions on File Prior to Visit  Medication Sig Dispense Refill  . ciprofloxacin (CIPRO) 250 MG tablet Take 1 tablet (250 mg total) by mouth 2 (two) times daily. 14 tablet 0  . Clobetasol Prop Emollient Base 0.05 % emollient cream Apply 1 application topically 2 (two) times daily as needed. 30 g 1  .  fenofibrate 160 MG tablet Take 1 tablet (160 mg total) by mouth daily. 90 tablet 1  . Lancets (ONETOUCH ULTRASOFT) lancets Check BS bid  DX 250.00 200 each 0  . simvastatin (ZOCOR) 10 MG tablet Take 1 tablet (10 mg total) by mouth at bedtime. 90 tablet 1  . valsartan (DIOVAN) 320 MG tablet Take 1 tablet (320 mg total) by mouth daily. 90 tablet 1  . cholecalciferol (VITAMIN D) 1000 UNITS tablet Take 1,000 Units by mouth daily. Reported on 10/25/2015     No current facility-administered medications on file prior to visit.     BP 100/80   Pulse 66   Temp 98.5 F (36.9 C) (Oral)   Ht 5\' 1"  (1.549 m)   Wt 138 lb 6.4 oz (62.8 kg)   BMI 26.15 kg/m    Objective:   Physical Exam  Constitutional: She is oriented to person, place, and time. She appears well-developed and well-nourished.  HENT:  Head: Normocephalic and atraumatic.  Cardiovascular: Normal rate, regular rhythm and normal heart sounds.   No murmur heard. Pulmonary/Chest: Effort normal and breath sounds normal. No respiratory distress. She  has no wheezes.  Abdominal: Soft. Bowel sounds are normal. She exhibits no distension. There is no tenderness. There is no rebound.  Musculoskeletal: She exhibits no edema.  Neurological: She is alert and oriented to person, place, and time.  Skin: Skin is warm and dry.  Psychiatric: She has a normal mood and affect. Her behavior is normal. Judgment and thought content normal.          Assessment & Plan:  Pyelonephritis- clinically resolved. Advised pt to complete antibiotics.  Call if recurrent symptoms.

## 2015-11-08 NOTE — Patient Instructions (Signed)
Please complete your antibiotics. Call if you develop recurrent back pain, fever, or urinary issues (burning/frequency/blood in the urine).  We are glad you are feeling better!

## 2015-11-21 ENCOUNTER — Ambulatory Visit: Payer: BLUE CROSS/BLUE SHIELD | Admitting: Family

## 2016-04-26 ENCOUNTER — Other Ambulatory Visit: Payer: Self-pay | Admitting: Family

## 2016-04-26 DIAGNOSIS — N6320 Unspecified lump in the left breast, unspecified quadrant: Secondary | ICD-10-CM

## 2016-05-07 ENCOUNTER — Telehealth: Payer: Self-pay | Admitting: Family

## 2016-05-07 MED ORDER — VALSARTAN 320 MG PO TABS: 320.0000 mg | ORAL_TABLET | Freq: Every day | ORAL | 0 refills | Status: DC

## 2016-05-07 NOTE — Telephone Encounter (Signed)
Diovan refill sent to pharmacy. Please call pt and schedule her for a follow up with [facility] soon. She was due for 6 month follow up on 05/10/16. Thanks!

## 2016-05-07 NOTE — Telephone Encounter (Signed)
Patient is requesting a refill of valsartan (DIOVAN) 320 MG tablet Please advise  Pharmacy:  [facility], [place]

## 2016-05-08 NOTE — Telephone Encounter (Signed)
Called to make pt aware. Her husband said that they have already picked up medication from pharmacy. ALSO made him aware that an appt is needed. He says that they will discuss their schedule and call back to schedule.

## 2016-05-14 ENCOUNTER — Telehealth: Payer: Self-pay | Admitting: Family

## 2016-05-14 MED ORDER — FENOFIBRATE 160 MG PO TABS: 160.0000 mg | ORAL_TABLET | Freq: Every day | ORAL | 0 refills | Status: DC

## 2016-05-14 NOTE — Telephone Encounter (Signed)
30 Day supply sent to pharmacy and detailed message left on voicemail to call and schedule an appointment before further refills can be sent. Mailed letter to pt.

## 2016-05-14 NOTE — Telephone Encounter (Signed)
Patient's spouse called requesting a refill of fenofibrate 160 MG tablet Please advise   Pharmacy: [facility], [place]

## 2016-05-22 ENCOUNTER — Ambulatory Visit
Admission: RE | Admit: 2016-05-22 | Discharge: 2016-05-22 | Disposition: A | Discharge status: Home / Self Care | Payer: BLUE CROSS/BLUE SHIELD | Source: Ambulatory Visit | Attending: Family | Admitting: Family

## 2016-05-22 ENCOUNTER — Other Ambulatory Visit: Payer: Self-pay | Admitting: Family

## 2016-05-22 DIAGNOSIS — N6320 Unspecified lump in the left breast, unspecified quadrant: Secondary | ICD-10-CM

## 2016-05-22 DIAGNOSIS — R928 Other abnormal and inconclusive findings on diagnostic imaging of breast: Secondary | ICD-10-CM | POA: Diagnosis not present

## 2016-05-28 ENCOUNTER — Ambulatory Visit: Payer: BLUE CROSS/BLUE SHIELD | Admitting: Family

## 2016-06-18 ENCOUNTER — Telehealth: Payer: Self-pay | Admitting: Family

## 2016-06-18 ENCOUNTER — Ambulatory Visit: Payer: BLUE CROSS/BLUE SHIELD | Admitting: Family

## 2016-06-18 NOTE — Telephone Encounter (Signed)
Pt called in at 9:22a  to reschedule appt. Pt is unable to fast until 2:30 appt. Pt rescheduled to this Friday at 11:00 am.

## 2016-06-18 NOTE — Telephone Encounter (Signed)
Ok, no charge please.  

## 2016-06-22 ENCOUNTER — Encounter: Payer: Self-pay | Admitting: Family

## 2016-06-22 ENCOUNTER — Ambulatory Visit (INDEPENDENT_AMBULATORY_CARE_PROVIDER_SITE_OTHER): Payer: BLUE CROSS/BLUE SHIELD | Admitting: Family

## 2016-06-22 VITALS — BP 148/81 | HR 69 | Temp 99.0°F | Resp 18 | Ht 61.0 in | Wt 142.2 lb

## 2016-06-22 DIAGNOSIS — E781 Pure hyperglyceridemia: Secondary | ICD-10-CM

## 2016-06-22 DIAGNOSIS — E119 Type 2 diabetes mellitus without complications: Secondary | ICD-10-CM | POA: Diagnosis not present

## 2016-06-22 DIAGNOSIS — I10 Essential (primary) hypertension: Secondary | ICD-10-CM | POA: Diagnosis not present

## 2016-06-22 DIAGNOSIS — E785 Hyperlipidemia, unspecified: Secondary | ICD-10-CM | POA: Diagnosis not present

## 2016-06-22 LAB — LIPID PANEL
CHOLESTEROL: 129 mg/dL (ref 0–200)
HDL: 58.9 mg/dL (ref >39.00)
LDL Cholesterol: 53 mg/dL (ref 0–99)
NONHDL: 69.8
Total CHOL/HDL Ratio: 2
Triglycerides: 84 mg/dL (ref 0.0–149.0)
VLDL: 16.8 mg/dL (ref 0.0–40.0)

## 2016-06-22 LAB — COMPREHENSIVE METABOLIC PANEL
ALBUMIN: 4.6 g/dL (ref 3.5–5.2)
ALK PHOS: 52 U/L (ref 39–117)
ALT: 29 U/L (ref 0–35)
AST: 30 U/L (ref 0–37)
BUN: 20 mg/dL (ref 6–23)
CO2: 27 mEq/L (ref 19–32)
CREATININE: 0.89 mg/dL (ref 0.40–1.20)
Calcium: 9.6 mg/dL (ref 8.4–10.5)
Chloride: 108 mEq/L (ref 96–112)
GFR: 68.71 mL/min (ref >60.00)
GLUCOSE: 99 mg/dL (ref 70–99)
Potassium: 4.2 mEq/L (ref 3.5–5.1)
Sodium: 142 mEq/L (ref 135–145)
TOTAL PROTEIN: 7.7 g/dL (ref 6.0–8.3)
Total Bilirubin: 0.5 mg/dL (ref 0.2–1.2)

## 2016-06-22 LAB — HEMOGLOBIN A1C: HEMOGLOBIN A1C: 6.6 % — AB (ref 4.6–6.5)

## 2016-06-22 MED ORDER — VALSARTAN 320 MG PO TABS: 320.0000 mg | ORAL_TABLET | Freq: Every day | ORAL | 0 refills | Status: DC

## 2016-06-22 MED ORDER — FENOFIBRATE 160 MG PO TABS: 160.0000 mg | ORAL_TABLET | Freq: Every day | ORAL | 1 refills | Status: DC

## 2016-06-22 MED ORDER — HYDROCHLOROTHIAZIDE 25 MG PO TABS: 25.0000 mg | ORAL_TABLET | Freq: Every day | ORAL | 0 refills | Status: DC

## 2016-06-22 MED ORDER — SIMVASTATIN 10 MG PO TABS: 10.0000 mg | ORAL_TABLET | Freq: Every day | ORAL | 1 refills | Status: DC

## 2016-06-22 NOTE — Assessment & Plan Note (Signed)
Stable on current meds. Continue same, obtain bmet and a1c.

## 2016-06-22 NOTE — Progress Notes (Signed)
Pre visit review using our clinic review tool, if applicable. No additional management support is needed unless otherwise documented below in the visit note. 

## 2016-06-22 NOTE — Patient Instructions (Addendum)
Please complete lab work prior to leaving.  Begin hctz once daily in the AM for your blood pressure.

## 2016-06-22 NOTE — Assessment & Plan Note (Signed)
Above goal. Add hctz once daily. Follow up in 2 weeks for a nurse visit BP recheck.

## 2016-06-22 NOTE — Assessment & Plan Note (Signed)
Tolerating statin, obtain follow-up lipid panel. 

## 2016-06-22 NOTE — Progress Notes (Signed)
Subjective:    Patient ID: Katherine Kelly, female    DOB: Nov 01, 1955, 61 y.o.   MRN: [id number]  HPI   Katherine Kelly is a 61 yr old female who presents today for follow up.  1) HTN- Currently maintained on valsartan 320mg . Denies CP/SOB or swelling.   BP Readings from Last 3 Encounters:  06/22/16 (!) 148/81  11/08/15 100/80  11/03/15 110/80   2) Hyperlipidemia- maintained on simvastatin. Denies myalgia Lab Results  Component Value Date   CHOL 147 04/04/2015   HDL 60.70 04/04/2015   LDLCALC 64 04/04/2015   LDLDIRECT 64.9 12/17/2006   TRIG 111.0 04/04/2015   CHOLHDL 2 04/04/2015   3) DM2- diet controlled. Notes AM sugars generally in the low 100's.   Lab Results  Component Value Date   HGBA1C 6.9 (H) 10/25/2015   HGBA1C 6.7 (H) 04/04/2015   HGBA1C 6.8 (H) 12/14/2014   Lab Results  Component Value Date   MICROALBUR 2.3 (H) 12/14/2014   LDLCALC 64 04/04/2015   CREATININE 0.96 11/03/2015       Review of Systems See HPI  Past Medical History:  Diagnosis Date  . ACE-inhibitor cough   . Diabetes mellitus, type 2 ([place]) 09/2006   no meds  . History of colonic polyps   . Hypertension      Social History   Social History  . Marital status: Married    Spouse name: N/A  . Number of children: N/A  . Years of education: N/A   Occupational History  . Not on file.   Social History Main Topics  . Smoking status: Never Smoker  . Smokeless tobacco: Never Used  . Alcohol use No  . Drug use: No  . Sexual activity: Not on file   Other Topics Concern  . 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 BREAST BIOPSY Left    stereo tatic  . CARDIOVASCULAR STRESS TEST  11/08/2006  . [place]  . COLONOSCOPY    . ESOPHAGOGASTRODUODENOSCOPY  04/2006  . POLYPECTOMY       Family History  Problem Relation Age of Onset  . Colon cancer Neg Hx   . Rectal cancer Neg Hx   . Stomach cancer Neg Hx     Allergies  Allergen Reactions  . Ace Inhibitors     weakness    Current Outpatient Prescriptions on File Prior to Visit  Medication Sig Dispense Refill  . cholecalciferol (VITAMIN D) 1000 UNITS tablet Take 1,000 Units by mouth daily. Reported on 10/25/2015    . Clobetasol Prop Emollient Base 0.05 % emollient cream Apply 1 application topically 2 (two) times daily as needed. 30 g 1  . Lancets (ONETOUCH ULTRASOFT) lancets Check BS bid  DX 250.00 200 each 0   No current facility-administered medications on file prior to visit.     BP (!) 148/81 (BP Location: Right Arm, Cuff Size: Normal)   Pulse 69   Temp 99 F (37.2 C) (Oral)   Resp 18   Ht 5\' 1"  (1.549 m)   Wt 142 lb 3.2 oz (64.5 kg)   SpO2 100%   BMI 26.87 kg/m       Objective:   Physical Exam  Constitutional: She is  oriented to person, place, and time. She appears well-developed and well-nourished.  Cardiovascular: Normal rate, regular rhythm and normal heart sounds.   No murmur heard. Pulmonary/Chest: Effort normal and breath sounds normal. No respiratory distress. She has no wheezes.  Musculoskeletal: She exhibits no edema.  Neurological: She is alert and oriented to person, place, and time.  Psychiatric: She has a normal mood and affect. Her behavior is normal. Judgment and thought content normal.          Assessment & Plan:

## 2016-08-02 ENCOUNTER — Ambulatory Visit (INDEPENDENT_AMBULATORY_CARE_PROVIDER_SITE_OTHER): Payer: BLUE CROSS/BLUE SHIELD | Admitting: Family Medicine

## 2016-08-02 VITALS — BP 114/79 | HR 75

## 2016-08-02 DIAGNOSIS — I10 Essential (primary) hypertension: Secondary | ICD-10-CM | POA: Diagnosis not present

## 2016-08-02 NOTE — Progress Notes (Signed)
Pre visit review using our clinic review tool, if applicable. No additional management support is needed unless otherwise documented below in the visit note.  Patient presents in office for a blood pressure check per OV note 06/22/16. She reported adherence to medications & regimen. Patient denies headache, dizziness, lightheadedness or any other symptoms. Today's readings were: BP 114/79 P 75 & 02 100%.  Per Dr. Nani Ravens: Continue current medications & regimen. Follow-up with your PCP in 2 months.  Informed patient of the provider's recommendations. She verbalized understanding. Patient will call the office to schedule her next appointment.

## 2016-08-02 NOTE — Patient Instructions (Signed)
Per Dr. Nani Ravens: Continue current medications & regimen. Follow-up with your PCP in 2 months.

## 2016-08-02 NOTE — Progress Notes (Signed)
Noted. Agree with above.  

## 2016-08-08 ENCOUNTER — Other Ambulatory Visit: Payer: Self-pay | Admitting: Family

## 2016-08-08 NOTE — Telephone Encounter (Signed)
Spoke with pt's husband. Pt needs refill for valsartan. Notified pt medication is at pharmacy ready for pickup.

## 2016-08-08 NOTE — Telephone Encounter (Signed)
Caller name: Relationship to patient: Self Can be reached: [phone]  Pharmacy:  Reason for call: Refill hydrochlorothiazide (HYDRODIURIL) 25 MG tablet [[id number]]

## 2016-10-03 ENCOUNTER — Ambulatory Visit (INDEPENDENT_AMBULATORY_CARE_PROVIDER_SITE_OTHER): Payer: BLUE CROSS/BLUE SHIELD | Admitting: Family Medicine

## 2016-10-03 VITALS — BP 121/75 | HR 69 | Temp 99.0°F | Ht 61.0 in | Wt 141.4 lb

## 2016-10-03 DIAGNOSIS — J020 Streptococcal pharyngitis: Secondary | ICD-10-CM

## 2016-10-03 DIAGNOSIS — J029 Acute pharyngitis, unspecified: Secondary | ICD-10-CM

## 2016-10-03 LAB — POCT INR

## 2016-10-03 LAB — POCT RAPID STREP A (OFFICE): Rapid Strep A Screen: POSITIVE — AB

## 2016-10-03 MED ORDER — PENICILLIN V POTASSIUM 500 MG PO TABS: 500.0000 mg | ORAL_TABLET | Freq: Two times a day (BID) | ORAL | 0 refills | Status: DC

## 2016-10-03 MED FILL — PENICILLIN VK 500 MG TABLET: 500 | 10 days supply | Qty: 20 | Fill #0

## 2016-10-03 NOTE — Progress Notes (Signed)
[place] at [facility] [address], [place], [place] [address] [phone]  Date:  10/03/2016   Name:  Katherine Kelly   DOB:  1955/06/26   MRN:  [id number]  PCP:  Debbrah Alar, NP    Chief Complaint: Sore Throat   History of Present Illness:  Katherine Kelly is a 61 y.o. very pleasant female patient who presents with the following:  Here today with a ST for the last 2-3 days They have noted a low grade fever at home, and she feels tired in general Mild cough No nausea, vomiting or diarrhea No known strep exposures  They have tried some OTC meds for her ST so far at home  She has a history of HTN and DM- both well controlled as of late No other concerns today  Lab Results  Component Value Date   HGBA1C 6.6 (H) 06/22/2016   BP Readings from Last 3 Encounters:  10/03/16 121/75  08/02/16 114/79  06/22/16 (!) 148/81     Patient Active Problem List   Diagnosis Date Noted  . Eczema 09/14/2014  . Depression 08/09/2014  . Myalgia 08/31/2013  . Diabetic neuropathy ([place]) 08/31/2013  . Annual physical exam 12/22/2010  . RHINOSINUSITIS, CHRONIC 07/07/2009  . ALLERGIC RHINITIS 01/03/2009  . HYPERTRIGLYCERIDEMIA 02/18/2007  . Diabetes type 2, controlled ([place]) 11/27/2006  . Essential hypertension 11/27/2006  . COLONIC POLYPS, HX OF 11/27/2006    Past Medical History:  Diagnosis Date  . ACE-inhibitor cough   . Diabetes mellitus, type 2 ([place]) 09/2006   no meds  . History of colonic polyps   . Hypertension     Past Surgical History:  Procedure Laterality Date  . BREAST BIOPSY Left    stereo tatic  . CARDIOVASCULAR STRESS TEST  11/08/2006  . [place]  . COLONOSCOPY    . ESOPHAGOGASTRODUODENOSCOPY  04/2006  . POLYPECTOMY      Social History  Substance Use Topics  . Smoking status: Never Smoker  . Smokeless tobacco: Never Used  . Alcohol use No    Family History  Problem Relation Age  of Onset  . Colon cancer Neg Hx   . Rectal cancer Neg Hx   . Stomach cancer Neg Hx     Allergies  Allergen Reactions  . Ace Inhibitors     weakness    Medication list has been reviewed and updated.  Current Outpatient Prescriptions on File Prior to Visit  Medication Sig Dispense Refill  . cholecalciferol (VITAMIN D) 1000 UNITS tablet Take 1,000 Units by mouth daily. Reported on 10/25/2015    . Clobetasol Prop Emollient Base 0.05 % emollient cream Apply 1 application topically 2 (two) times daily as needed. 30 g 1  . fenofibrate 160 MG tablet Take 1 tablet (160 mg total) by mouth daily. 90 tablet 1  . hydrochlorothiazide (HYDRODIURIL) 25 MG tablet Take 1 tablet (25 mg total) by mouth daily. 90 tablet 0  . Lancets (ONETOUCH ULTRASOFT) lancets Check BS bid  DX 250.00 200 each 0  . simvastatin (ZOCOR) 10 MG tablet Take 1 tablet (10 mg total) by mouth at bedtime. 90 tablet 1  . valsartan (DIOVAN) 320 MG tablet Take 1 tablet (320 mg total) by mouth daily. 90 tablet 0   No current facility-administered medications on file prior to visit.     Review of Systems:  As per HPI- otherwise negative. No vomiting or diarrhea  Physical  Examination: Vitals:   10/03/16 1549  BP: 121/75  Pulse: 69  Temp: 99 F (37.2 C)   Vitals:   10/03/16 1549  Weight: 141 lb 6.4 oz (64.1 kg)  Height: 5\' 1"  (1.549 m)   Body mass index is 26.72 kg/m. Ideal Body Weight: Weight in (lb) to have BMI = 25: 132  GEN: WDWN, NAD, Non-toxic, A & O x 3, looks well, mild overweight HEENT: Atraumatic, Normocephalic. Neck supple. No masses, No LAD.  Bilateral TM wnl, bilateral oropharynx inflamed, no exudate or evidence of abscess however.  PEERL,EOMI.   Ears and Nose: No external deformity. CV: RRR, No M/G/R. No JVD. No thrill. No extra heart sounds. PULM: CTA B, no wheezes, crackles, rhonchi. No retractions. No resp. distress. No accessory muscle use. EXTR: No c/c/e NEURO Normal gait.  PSYCH: Normally  interactive. Conversant. Not depressed or anxious appearing.  Calm demeanor.   Results for orders placed or performed in visit on 10/03/16  POCT INR  Result Value Ref Range   INR    POCT rapid strep A  Result Value Ref Range   Rapid Strep A Screen Positive (A) Negative     Assessment and Plan: Strep pharyngitis - Plan: penicillin v potassium (VEETID) 500 MG tablet  Sore throat - Plan: POCT INR, POCT rapid strep A  INR was entered in error- this test was not performed  Positive rapid strep today Will treat her with penicillin, close follow-up if not feeling better soon Signed Lamar Blinks, MD

## 2016-10-03 NOTE — Patient Instructions (Signed)
You have strep throat!  We are going to treat you with oral penicillin (antibiotic)- take it twice a day for 10 days.  This should help you feel better within the next couple of days. Please let me know if you are not feeling better- sooner if you are feeling worse

## 2016-10-18 ENCOUNTER — Telehealth: Payer: Self-pay | Admitting: Family

## 2016-10-18 ENCOUNTER — Ambulatory Visit: Payer: BLUE CROSS/BLUE SHIELD | Admitting: Family

## 2016-10-18 NOTE — Telephone Encounter (Signed)
Caller name: Wohlfarth,Xua Relation to pt: spouse Call back [phone]   Reason for call:  Cancelling 2pm appointment due to patient feeling sick from strep will call back to [facility], charge or no charge

## 2016-10-18 NOTE — Telephone Encounter (Signed)
No charge. 

## 2016-10-28 ENCOUNTER — Other Ambulatory Visit: Payer: Self-pay | Admitting: Family

## 2016-10-30 NOTE — Telephone Encounter (Signed)
Valsartan refill sent to pharmacy. Pt was due for 3 month follow up with PCP in July and did not show for appt on 10/18/16. Letter mailed to pt.

## 2016-11-03 ENCOUNTER — Telehealth: Payer: Self-pay | Admitting: Family

## 2016-11-03 MED ORDER — LOSARTAN POTASSIUM 100 MG PO TABS
100.0000 mg | ORAL_TABLET | Freq: Every day | ORAL | 3 refills | Status: DC
Start: 2016-11-03 — End: 2016-11-03

## 2016-11-03 NOTE — Telephone Encounter (Signed)
Please contact patient and let her know that Diovan has been recalled. Pharmacy would like [place] to change her to a different medication please. Please stop Diovan, start losartan 100 mg by mouth daily. Follow-up in 2 weeks for nurse visit for blood pressure recheck and follow-up basic metabolic panel. Diagnosis hypertension. Thanks.

## 2016-11-05 MED ORDER — LOSARTAN POTASSIUM 100 MG PO TABS: 100.0000 mg | ORAL_TABLET | Freq: Every day | ORAL | 3 refills | Status: DC

## 2016-11-05 NOTE — Telephone Encounter (Signed)
She should stop diovan, start losartan please.

## 2016-11-05 NOTE — Telephone Encounter (Signed)
Clarification: on which med she is switching to.   pc

## 2016-11-05 NOTE — Telephone Encounter (Signed)
Noted    PC 

## 2016-11-08 NOTE — Telephone Encounter (Signed)
Has patient been contacted about this?

## 2016-11-09 NOTE — Telephone Encounter (Signed)
Yes I called patient to verify and let her know.   PC

## 2016-12-04 ENCOUNTER — Ambulatory Visit: Payer: BLUE CROSS/BLUE SHIELD | Admitting: Family

## 2016-12-04 ENCOUNTER — Encounter: Payer: Self-pay | Admitting: Family

## 2016-12-04 ENCOUNTER — Ambulatory Visit (INDEPENDENT_AMBULATORY_CARE_PROVIDER_SITE_OTHER): Payer: BLUE CROSS/BLUE SHIELD | Admitting: Family

## 2016-12-04 ENCOUNTER — Other Ambulatory Visit (HOSPITAL_COMMUNITY)
Admission: RE | Admit: 2016-12-04 | Discharge: 2016-12-04 | Disposition: A | Discharge status: Home / Self Care | Payer: BLUE CROSS/BLUE SHIELD | Source: Ambulatory Visit | Attending: Family | Admitting: Family

## 2016-12-04 VITALS — BP 128/90 | HR 61 | Temp 97.7°F | Resp 16 | Ht 61.0 in | Wt 142.4 lb

## 2016-12-04 DIAGNOSIS — Z0000 Encounter for general adult medical examination without abnormal findings: Secondary | ICD-10-CM | POA: Insufficient documentation

## 2016-12-04 DIAGNOSIS — R87610 Atypical squamous cells of undetermined significance on cytologic smear of cervix (ASC-US): Secondary | ICD-10-CM | POA: Diagnosis not present

## 2016-12-04 DIAGNOSIS — E348 Other specified endocrine disorders: Secondary | ICD-10-CM

## 2016-12-04 DIAGNOSIS — E119 Type 2 diabetes mellitus without complications: Secondary | ICD-10-CM

## 2016-12-04 DIAGNOSIS — Z01419 Encounter for gynecological examination (general) (routine) without abnormal findings: Secondary | ICD-10-CM | POA: Diagnosis not present

## 2016-12-04 LAB — HEPATIC FUNCTION PANEL
ALT: 22 U/L (ref 0–35)
AST: 26 U/L (ref 0–37)
Albumin: 4.3 g/dL (ref 3.5–5.2)
Alkaline Phosphatase: 42 U/L (ref 39–117)
BILIRUBIN DIRECT: 0.2 mg/dL (ref 0.0–0.3)
BILIRUBIN TOTAL: 0.5 mg/dL (ref 0.2–1.2)
Total Protein: 7.8 g/dL (ref 6.0–8.3)

## 2016-12-04 LAB — BASIC METABOLIC PANEL
BUN: 17 mg/dL (ref 6–23)
CALCIUM: 9.5 mg/dL (ref 8.4–10.5)
CO2: 30 mEq/L (ref 19–32)
CREATININE: 0.84 mg/dL (ref 0.40–1.20)
Chloride: 106 mEq/L (ref 96–112)
GFR: 73.34 mL/min (ref >60.00)
Glucose, Bld: 98 mg/dL (ref 70–99)
POTASSIUM: 3.8 meq/L (ref 3.5–5.1)
Sodium: 140 mEq/L (ref 135–145)

## 2016-12-04 LAB — CBC WITH DIFFERENTIAL/PLATELET
BASOS ABS: 0.1 10*3/uL (ref 0.0–0.1)
Basophils Relative: 0.8 % (ref 0.0–3.0)
EOS ABS: 0.3 10*3/uL (ref 0.0–0.7)
Eosinophils Relative: 3.9 % (ref 0.0–5.0)
HCT: 40.5 % (ref 36.0–46.0)
HEMOGLOBIN: 13.2 g/dL (ref 12.0–15.0)
Lymphocytes Relative: 30.8 % (ref 12.0–46.0)
Lymphs Abs: 2.1 10*3/uL (ref 0.7–4.0)
MCHC: 32.7 g/dL (ref 30.0–36.0)
MCV: 100.2 fl — ABNORMAL HIGH (ref 78.0–100.0)
MONO ABS: 0.5 10*3/uL (ref 0.1–1.0)
Monocytes Relative: 7.2 % (ref 3.0–12.0)
Neutro Abs: 3.9 10*3/uL (ref 1.4–7.7)
Neutrophils Relative %: 57.3 % (ref 43.0–77.0)
Platelets: 217 10*3/uL (ref 150.0–400.0)
RBC: 4.04 Mil/uL (ref 3.87–5.11)
RDW: 13 % (ref 11.5–15.5)
WBC: 6.8 10*3/uL (ref 4.0–10.5)

## 2016-12-04 LAB — HEMOGLOBIN A1C: Hgb A1c MFr Bld: 6.7 % — ABNORMAL HIGH (ref 4.6–6.5)

## 2016-12-04 LAB — LIPID PANEL
Cholesterol: 114 mg/dL (ref 0–200)
HDL: 51.8 mg/dL (ref >39.00)
LDL CALC: 40 mg/dL (ref 0–99)
NonHDL: 62.26
TRIGLYCERIDES: 113 mg/dL (ref 0.0–149.0)
Total CHOL/HDL Ratio: 2
VLDL: 22.6 mg/dL (ref 0.0–40.0)

## 2016-12-04 LAB — TSH: TSH: 1.74 u[IU]/mL (ref 0.35–4.50)

## 2016-12-04 MED ORDER — ZOSTER VAC RECOMB ADJUVANTED 50 MCG/0.5ML IM SUSR: 0.5000 mL | Freq: Once | INTRAMUSCULAR | 0 refills | Status: AC

## 2016-12-04 MED ORDER — ONETOUCH ULTRASOFT LANCETS MISC: 0 refills | Status: DC

## 2016-12-04 MED ORDER — HYDROCHLOROTHIAZIDE 25 MG PO TABS: 25.0000 mg | ORAL_TABLET | Freq: Every day | ORAL | 1 refills | Status: DC

## 2016-12-04 MED ORDER — LOSARTAN POTASSIUM 100 MG PO TABS: 100.0000 mg | ORAL_TABLET | Freq: Every day | ORAL | 1 refills | Status: DC

## 2016-12-04 NOTE — Patient Instructions (Addendum)
Please complete lab work prior to leaving.  Please ask pharmacist to give you the shingles vaccine at your pharmacy. I have sent the prescription. It is a 2 dose series and second shot is given 2-6 months after the first shot.  Please schedule bone density test on the first floor in the imaging department.  Please call [place] to schedule diabetic eye exam. [phone]

## 2016-12-04 NOTE — Progress Notes (Signed)
Subjective:    Patient ID: Katherine Kelly, female    DOB: 1955/09/01, 61 y.o.   MRN: [id number]  HPI  Patient presents today for complete physical.  Immunizations: tetanus up to date, due for Shingrix.  Diet: healthy Exercise: walks 3 days a week.  Colonoscopy: 09/09/12 Dexa:  due Pap Smear: 01/28/16  Mammogram:2/18  Wt Readings from Last 3 Encounters:  12/04/16 142 lb 6.4 oz (64.6 kg)  10/03/16 141 lb 6.4 oz (64.1 kg)  06/22/16 142 lb 3.2 oz (64.5 kg)      Review of Systems  Constitutional: Negative for unexpected weight change.  HENT: Negative for hearing loss and rhinorrhea.   Eyes: Negative for visual disturbance.  Respiratory: Negative for cough and shortness of breath.   Cardiovascular: Negative for chest pain.  Gastrointestinal: Negative for constipation.  Genitourinary: Negative for dysuria.  Musculoskeletal: Negative for arthralgias and myalgias.  Skin: Negative for rash.  Neurological: Negative for headaches.  Hematological:       Denies depression/anxiety       Past Medical History:  Diagnosis Date  . ACE-inhibitor cough   . Diabetes mellitus, type 2 ([place]) 09/2006   no meds  . History of colonic polyps   . Hypertension      Social History   Social History  . Marital status: Married    Spouse name: N/A  . Number of children: N/A  . Years of education: N/A   Occupational History  . Not on file.   Social History Main Topics  . Smoking status: Never Smoker  . Smokeless tobacco: Never Used  . Alcohol use No  . Drug use: No  . Sexual activity: Not on file   Other Topics Concern  . Not on file   Social History Narrative   Lives with husband and daughter   Freight forwarder at [employer]   No pets   Did not complete high school   Enjoys travel and shopping    Past Surgical History:  Procedure Laterality Date  . BREAST BIOPSY Left    stereo tatic  . CARDIOVASCULAR STRESS TEST  11/08/2006  . [place]  . COLONOSCOPY      . ESOPHAGOGASTRODUODENOSCOPY  04/2006  . POLYPECTOMY      Family History  Problem Relation Age of Onset  . Colon cancer Neg Hx   . Rectal cancer Neg Hx   . Stomach cancer Neg Hx     Allergies  Allergen Reactions  . Ace Inhibitors     weakness    Current Outpatient Prescriptions on File Prior to Visit  Medication Sig Dispense Refill  . cholecalciferol (VITAMIN D) 1000 UNITS tablet Take 1,000 Units by mouth daily. Reported on 10/25/2015    . Clobetasol Prop Emollient Base 0.05 % emollient cream Apply 1 application topically 2 (two) times daily as needed. 30 g 1  . fenofibrate 160 MG tablet Take 1 tablet (160 mg total) by mouth daily. 90 tablet 1  . simvastatin (ZOCOR) 10 MG tablet Take 1 tablet (10 mg total) by mouth at bedtime. 90 tablet 1   No current facility-administered medications on file prior to visit.     BP 128/90 (BP Location: Left Arm, Cuff Size: Normal)   Pulse 61   Temp 97.7 F (36.5 C) (Oral)   Resp 16   Ht 5\' 1"  (1.549 m)   Wt 142 lb 6.4 oz (64.6 kg)   SpO2 98%   BMI 26.91 kg/m  Objective:   Physical Exam Physical Exam  Constitutional: She is oriented to person, place, and time. She appears well-developed and well-nourished. No distress.  HENT:  Head: Normocephalic and atraumatic.  Right Ear: Tympanic membrane and ear canal normal.  Left Ear: Tympanic membrane and ear canal normal.  Mouth/Throat: Oropharynx is clear and moist.  Eyes: Pupils are equal, round, and reactive to light. No scleral icterus.  Neck: Normal range of motion. No thyromegaly present.  Cardiovascular: Normal rate and regular rhythm.   No murmur heard. Pulmonary/Chest: Effort normal and breath sounds normal. No respiratory distress. He has no wheezes. She has no rales. She exhibits no tenderness.  Abdominal: Soft. Bowel sounds are normal. She exhibits no distension and no mass. There is no tenderness. There is no rebound and no guarding.  Musculoskeletal: She exhibits no edema.   Lymphadenopathy:    She has no cervical adenopathy.  Neurological: She is alert and oriented to person, place, and time. She has normal patellar reflexes. She exhibits normal muscle tone. Coordination normal.  Skin: Skin is warm and dry.  Psychiatric: She has a normal mood and affect. Her behavior is normal. Judgment and thought content normal.  Breasts: Examined lying Right: Without masses, retractions, discharge or axillary adenopathy.  Left: Without masses, retractions, discharge or axillary adenopathy.  Inguinal/mons: Normal without inguinal adenopathy  External genitalia: Normal  BUS/Urethra/Skene's glands: Normal  Bladder: Normal  Vagina: Normal  Cervix: Normal  Uterus: normal in size, shape and contour. Midline and mobile  Adnexa/parametria:  Rt: Without masses or tenderness.  Lt: Without masses or tenderness.  Anus and perineum: Normal            Assessment & Plan:   Preventative care- discussed healthy diet, exercise.  mammo up to date. Pap performed.  Tetanus/pneumovax up to date.due for shingrix. We do not have in stock, sent rx to her pharmacy.  EKG is personally reviewed and compared to previous EKG- appears unchanged.   DM2- check follow up A1C.         Assessment & Plan:

## 2016-12-05 LAB — URINALYSIS, ROUTINE W REFLEX MICROSCOPIC
Bilirubin Urine: NEGATIVE
Hgb urine dipstick: NEGATIVE
KETONES UR: NEGATIVE
Leukocytes, UA: NEGATIVE
Nitrite: NEGATIVE
PH: 6 (ref 5.0–8.0)
RBC / HPF: NONE SEEN (ref 0–?)
SPECIFIC GRAVITY, URINE: 1.02 (ref 1.000–1.030)
Total Protein, Urine: NEGATIVE
URINE GLUCOSE: NEGATIVE
UROBILINOGEN UA: 0.2 (ref 0.0–1.0)

## 2016-12-06 ENCOUNTER — Encounter: Payer: Self-pay | Admitting: Family

## 2016-12-12 ENCOUNTER — Telehealth: Payer: Self-pay | Admitting: Family

## 2016-12-12 DIAGNOSIS — R87610 Atypical squamous cells of undetermined significance on cytologic smear of cervix (ASC-US): Secondary | ICD-10-CM

## 2016-12-12 LAB — CYTOLOGY - PAP
DIAGNOSIS: UNDETERMINED — AB
HPV: NOT DETECTED

## 2016-12-12 NOTE — Telephone Encounter (Signed)
Notified pt and she voices understanding. 

## 2016-12-12 NOTE — Telephone Encounter (Signed)
abnomal pap. I would like her to see GYN. Referral placed.

## 2016-12-28 ENCOUNTER — Other Ambulatory Visit: Payer: Self-pay | Admitting: Family

## 2016-12-31 ENCOUNTER — Telehealth: Payer: Self-pay | Admitting: Family

## 2016-12-31 NOTE — Telephone Encounter (Signed)
Spouse   Request refill for fenofibrate 160 MG tablet - 90 day supply.    Pharmacy: [facility], [place]

## 2017-02-08 ENCOUNTER — Ambulatory Visit (INDEPENDENT_AMBULATORY_CARE_PROVIDER_SITE_OTHER): Payer: BLUE CROSS/BLUE SHIELD | Admitting: Family

## 2017-02-08 ENCOUNTER — Telehealth: Payer: Self-pay | Admitting: Family

## 2017-02-08 ENCOUNTER — Encounter: Payer: Self-pay | Admitting: Family

## 2017-02-08 VITALS — BP 131/91 | HR 71 | Temp 98.7°F | Ht 61.0 in | Wt 142.8 lb

## 2017-02-08 DIAGNOSIS — H1131 Conjunctival hemorrhage, right eye: Secondary | ICD-10-CM

## 2017-02-08 NOTE — Progress Notes (Signed)
Pre visit review using our clinic tool,if applicable. No additional management support is needed unless otherwise documented below in the visit note.  

## 2017-02-08 NOTE — Telephone Encounter (Signed)
Has been told me today at her appointment that they have not been contacted about her referral to gynecology for her abnormal Pap.  Continue please call gynecology and check on this referral?

## 2017-02-08 NOTE — Patient Instructions (Signed)

## 2017-02-08 NOTE — Progress Notes (Signed)
Subjective:    Patient ID: [name], female    DOB: 1955/05/02, 61 y.o.   MRN: [id number]  HPI  Ms. Riso is a 61 yr old female who presents today with chief complaint of eye pain. Reports redness right lateral eye. Occurred 2 days ago, denies associated pain, vision loss or itching.   Review of Systems   See HPI  Past Medical History:  Diagnosis Date  . ACE-inhibitor cough   . Diabetes mellitus, type 2 ([place]) 09/2006   no meds  . History of colonic polyps   . Hypertension      Social History   Social History  . Marital status: Married    Spouse name: N/A  . Number of children: N/A  . Years of education: N/A   Occupational History  . Not on file.   Social History Main Topics  . Smoking status: Never Smoker  . Smokeless tobacco: Never Used  . Alcohol use No  . Drug use: No  . Sexual activity: Not on file   Other Topics Concern  . 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 BREAST BIOPSY Left    stereo tatic  . CARDIOVASCULAR STRESS TEST  11/08/2006  . [place]  . COLONOSCOPY    . ESOPHAGOGASTRODUODENOSCOPY  04/2006  . POLYPECTOMY      Family History  Problem Relation Age of Onset  . Colon cancer Neg Hx   . Rectal cancer Neg Hx   . Stomach cancer Neg Hx     Allergies  Allergen Reactions  . Ace Inhibitors     weakness    Current Outpatient Prescriptions on File Prior to Visit  Medication Sig Dispense Refill  . cholecalciferol (VITAMIN D) 1000 UNITS tablet Take 1,000 Units by mouth daily. Reported on 10/25/2015    . Clobetasol Prop Emollient Base 0.05 % emollient cream Apply 1 application topically 2 (two) times daily as needed. 30 g 1  . fenofibrate 160 MG tablet take 1 tablet by mouth once daily 90 tablet 1  . hydrochlorothiazide  (HYDRODIURIL) 25 MG tablet Take 1 tablet (25 mg total) by mouth daily. 90 tablet 1  . Lancets (ONETOUCH ULTRASOFT) lancets Check Blood sugar twice a day  DX 250.00 200 each 0  . losartan (COZAAR) 100 MG tablet Take 1 tablet (100 mg total) by mouth daily. 90 tablet 1  . simvastatin (ZOCOR) 10 MG tablet Take 1 tablet (10 mg total) by mouth at bedtime. 90 tablet 1   No current facility-administered medications on file prior to visit.     BP (!) 131/91   Pulse 71   Temp 98.7 F (37.1 C) (Oral)   Ht 5\' 1"  (1.549 m)   Wt 142 lb 12.8 oz (64.8 kg)   SpO2 99%   BMI 26.98 kg/m        Objective:   Physical Exam  Constitutional: She is oriented to person, place, and time. She appears well-developed and well-nourished. No distress.  Eyes:    Neurological: She is alert and oriented to person, place, and time.  Psychiatric: She has a normal mood and affect. Her behavior is normal. Judgment and thought content normal.  Assessment & Plan:  Subconjunctival hemorrhage-reviewed causes of subconjunctival hemorrhage with patient and husband.  Discussed that this should be self-limited and should improve over the next few weeks.  They verbalized understanding and understand to call me if symptoms worsen or fail to improve.  They also state that they have not been contacted about her GYN referral for her abnormal Pap smear.  I will check with our scheduler.

## 2017-02-11 NOTE — Telephone Encounter (Signed)
Delsa Sale or Gwen-- can you check on status of referral?

## 2017-02-11 NOTE — Telephone Encounter (Signed)
[facility] has tried to contact patient to schedule, patient has not returned calls

## 2017-02-11 NOTE — Telephone Encounter (Signed)
Spoke with pt's spouse and provided him with # for GYN office to call and schedule appt.

## 2017-03-03 NOTE — Progress Notes (Deleted)
   Subjective:    Patient ID: Katherine Kelly, female    DOB: 14-Jul-1955, 61 y.o.   MRN: [id number]  HPI   Katherine Kelly is a 61 yr old female who presents today for routine follow up.  1) DM2- Sugar is diet controlled.  Lab Results  Component Value Date   HGBA1C 6.7 (H) 12/04/2016   HGBA1C 6.6 (H) 06/22/2016   HGBA1C 6.9 (H) 10/25/2015   Lab Results  Component Value Date   MICROALBUR 2.3 (H) 12/14/2014   LDLCALC 40 12/04/2016   CREATININE 0.84 12/04/2016   2) HTN- mainatined on losartan 100mg , hctz 25mg .   3) Hyperlipidemia- maintained on simvastatin and fenofibrate 160mg .   Lab Results  Component Value Date   CHOL 114 12/04/2016   HDL 51.80 12/04/2016   LDLCALC 40 12/04/2016   LDLDIRECT 64.9 12/17/2006   TRIG 113.0 12/04/2016   CHOLHDL 2 12/04/2016    Review of Systems    see HPI  Objective:   Physical Exam        Assessment & Plan:

## 2017-03-04 ENCOUNTER — Ambulatory Visit: Payer: BLUE CROSS/BLUE SHIELD | Admitting: Family

## 2017-03-20 ENCOUNTER — Ambulatory Visit (INDEPENDENT_AMBULATORY_CARE_PROVIDER_SITE_OTHER): Payer: BLUE CROSS/BLUE SHIELD | Admitting: Obstetrics and Gynecology

## 2017-03-20 ENCOUNTER — Encounter: Payer: Self-pay | Admitting: Obstetrics and Gynecology

## 2017-03-20 ENCOUNTER — Telehealth: Payer: Self-pay | Admitting: Family

## 2017-03-20 DIAGNOSIS — R87610 Atypical squamous cells of undetermined significance on cytologic smear of cervix (ASC-US): Secondary | ICD-10-CM | POA: Insufficient documentation

## 2017-03-20 HISTORY — DX: Atypical squamous cells of undetermined significance on cytologic smear of cervix (ASC-US): R87.610

## 2017-03-20 NOTE — Progress Notes (Signed)
Patient here to review abnormal pap smear - referral from Primary care. Kathrene Alu RNBSN

## 2017-03-20 NOTE — Telephone Encounter (Signed)
-----   Message from Katha Hamming sent at 03/20/2017  1:36 PM EST ----- Regarding: bone density We have left four messages  Since 10/24 for Sosha to call us back to schedule.  No return phone calls.  Thanks, Hoyle Sauer

## 2017-03-20 NOTE — Progress Notes (Signed)
   GYNECOLOGY OFFICE VISIT NOTE - Psychiatric nurse used  History:  61 y.o.  here today for follow up from primary care for abnormal pap smear. She denies any abnormal vaginal discharge, bleeding, pelvic pain or other concerns. Menopause age 20 with no bleeding since. Denies any issues.   Health Maintenance:  Last pap: 11/2016, ASCUS, negative high risk HPV Last mammogram: reports UTD on mammograms and they have been normal  Objective:  Physical Exam BP 131/77 (BP Location: Left Arm)   Pulse 83   Ht 5\' 1"  (1.549 m)   Wt 142 lb (64.4 kg)   BMI 26.83 kg/m  CONSTITUTIONAL: Well-developed, well-nourished female in no acute distress.  HENT:  Normocephalic, atraumatic. External right and left ear normal. Oropharynx is clear and moist EYES: Conjunctivae and EOM are normal. Pupils are equal, round, and reactive to light. No scleral icterus.  NECK: Normal range of motion, supple, no masses SKIN: Skin is warm and dry. No rash noted. Not diaphoretic. No erythema. No pallor. NEUROLOGIC: Alert and oriented to person, place, and time. Normal reflexes, muscle tone coordination. No cranial nerve deficit noted. PSYCHIATRIC: Normal mood and affect. Normal behavior. Normal judgment and thought content. RESPIRATORY: Effort normal, no problems with respiration noted PELVIC: Deferred MUSCULOSKELETAL: Normal range of motion.   Labs and Imaging No results found.  Assessment & Plan:   1. ASCUS of cervix with negative high risk HPV Reviewed findings on pap smear with patient and husband. Reviewed recommendation for repeat pap in 3 years. Reviewed that she does need follow up as this is considered an abnormal pap but mildly abnormal and no further workup indicated at this time. She and husband verbalize understanding and will return with any other issues.   Please refer to After Visit Summary for other counseling recommendations.   Return if symptoms worsen or fail to improve.   Feliz Beam,  M.D. Attending [place], [facility] for [employer], [place]

## 2017-05-22 ENCOUNTER — Other Ambulatory Visit: Payer: Self-pay | Admitting: Family

## 2017-07-01 ENCOUNTER — Other Ambulatory Visit: Payer: Self-pay | Admitting: Family

## 2017-08-21 ENCOUNTER — Encounter: Payer: Self-pay | Admitting: Internal Medicine

## 2017-10-01 ENCOUNTER — Other Ambulatory Visit: Payer: Self-pay | Admitting: Family

## 2017-12-12 ENCOUNTER — Other Ambulatory Visit: Payer: Self-pay | Admitting: Family

## 2017-12-13 NOTE — Telephone Encounter (Signed)
30 day supply of losartan sent to pharmacy as pt is past due for follow up with Melissa. Please call pt to schedule an appointment soon. Thanks!

## 2018-01-01 ENCOUNTER — Other Ambulatory Visit: Payer: Self-pay | Admitting: Family

## 2018-01-17 ENCOUNTER — Ambulatory Visit: Payer: BLUE CROSS/BLUE SHIELD | Admitting: Family

## 2018-01-17 ENCOUNTER — Encounter: Payer: Self-pay | Admitting: Family

## 2018-01-17 VITALS — BP 138/86 | HR 64 | Temp 97.8°F | Resp 16 | Ht 61.0 in | Wt 142.0 lb

## 2018-01-17 DIAGNOSIS — Z23 Encounter for immunization: Secondary | ICD-10-CM | POA: Diagnosis not present

## 2018-01-17 DIAGNOSIS — E785 Hyperlipidemia, unspecified: Secondary | ICD-10-CM | POA: Diagnosis not present

## 2018-01-17 DIAGNOSIS — E119 Type 2 diabetes mellitus without complications: Secondary | ICD-10-CM

## 2018-01-17 DIAGNOSIS — I10 Essential (primary) hypertension: Secondary | ICD-10-CM | POA: Diagnosis not present

## 2018-01-17 DIAGNOSIS — Z1159 Encounter for screening for other viral diseases: Secondary | ICD-10-CM

## 2018-01-17 MED ORDER — SIMVASTATIN 10 MG PO TABS: 10.0000 mg | ORAL_TABLET | Freq: Every day | ORAL | 1 refills | Status: DC

## 2018-01-17 MED ORDER — FENOFIBRATE 160 MG PO TABS: 160.0000 mg | ORAL_TABLET | Freq: Every day | ORAL | 1 refills | Status: DC

## 2018-01-17 MED ORDER — LOSARTAN POTASSIUM 25 MG PO TABS: 25.0000 mg | ORAL_TABLET | Freq: Every day | ORAL | 1 refills | Status: DC

## 2018-01-17 NOTE — Patient Instructions (Addendum)
Restart simvastatin for cholesterol. Continue fenofibrate. Start losartan 25mg  once daily for kidney protection and blood pressure control.  Complete lab work prior to leaving.

## 2018-01-17 NOTE — Progress Notes (Signed)
Subjective:    Patient ID: Katherine Kelly, female    DOB: Jun 14, 1955, 62 y.o.   MRN: [id number]  HPI  DM2- working on diet. Reports sugar 125 this AM.  Lab Results  Component Value Date   HGBA1C 6.7 (H) 12/04/2016   HGBA1C 6.6 (H) 06/22/2016   HGBA1C 6.9 (H) 10/25/2015   Lab Results  Component Value Date   MICROALBUR 2.3 (H) 12/14/2014   LDLCALC 40 12/04/2016   CREATININE 0.84 12/04/2016   Hyperlipidemia- taking fenofibrate, not taking statin.  Lab Results  Component Value Date   CHOL 114 12/04/2016   HDL 51.80 12/04/2016   LDLCALC 40 12/04/2016   LDLDIRECT 64.9 12/17/2006   TRIG 113.0 12/04/2016   CHOLHDL 2 12/04/2016   HTN- not taking losartan.  BP Readings from Last 3 Encounters:  01/17/18 138/86  03/20/17 131/77  02/08/17 (!) 131/91      Review of Systems See HPI  Past Medical History:  Diagnosis Date  . ACE-inhibitor cough   . Diabetes mellitus, type 2 ([place]) 09/2006   no meds  . History of colonic polyps   . Hypertension      Social History   Socioeconomic History  . Marital status: Married    Spouse name: Not on file  . Number of children: Not on file  . Years of education: Not on file  . Highest education level: Not on file  Occupational History  . Not on file  Social Needs  . Financial resource strain: Not on file  . Food insecurity:    Worry: Not on file    Inability: Not on file  . Transportation needs:    Medical: Not on file    Non-medical: Not on file  Tobacco Use  . Smoking status: Never Smoker  . Smokeless tobacco: Never Used  Substance and Sexual Activity  . Alcohol use: No  . Drug use: No  . Sexual activity: Not on file  Lifestyle  . Physical activity:    Days per week: Not on file    Minutes per session: Not on file  . Stress: Not on file  Relationships  . Social connections:    Talks on phone: Not on file    Gets together: Not on file    Attends religious service: Not on file    Active member of club or  organization: Not on file    Attends meetings of clubs or organizations: Not on file    Relationship status: Not on file  . Intimate partner violence:    Fear of current or ex partner: Not on file    Emotionally abused: Not on file    Physically abused: Not on file    Forced sexual activity: Not on file  Other Topics Concern  . 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 BREAST BIOPSY Left    stereo tatic  . CARDIOVASCULAR STRESS TEST  11/08/2006  . [place]  . COLONOSCOPY    . ESOPHAGOGASTRODUODENOSCOPY  04/2006  . POLYPECTOMY      Family History  Problem Relation Age of Onset  . Colon cancer Neg Hx   . Rectal cancer Neg Hx   . Stomach cancer Neg Hx  Allergies  Allergen Reactions  . Ace Inhibitors     weakness    Current Outpatient Medications on File Prior to Visit  Medication Sig Dispense Refill  . cholecalciferol (VITAMIN D) 1000 UNITS tablet Take 1,000 Units by mouth daily. Reported on 10/25/2015    . Clobetasol Prop Emollient Base 0.05 % emollient cream Apply 1 application topically 2 (two) times daily as needed. 30 g 1  . Lancets (ONETOUCH ULTRASOFT) lancets Check Blood sugar twice a day  DX 250.00 200 each 0   No current facility-administered medications on file prior to visit.     BP 138/86 (BP Location: Right Arm, Patient Position: Sitting, Cuff Size: Small)   Pulse 64   Temp 97.8 F (36.6 C) (Oral)   Resp 16   Ht 5\' 1"  (1.549 m)   Wt 142 lb (64.4 kg)   SpO2 100%   BMI 26.83 kg/m       Objective:   Physical Exam  Constitutional: She appears well-developed and well-nourished.  Cardiovascular: Normal rate, regular rhythm and normal heart sounds.  No murmur heard. Pulmonary/Chest: Effort normal and breath sounds normal. No respiratory  distress. She has no wheezes.  Psychiatric: She has a normal mood and affect. Her behavior is normal. Judgment and thought content normal.          Assessment & Plan:  HTN- slightly above goal and has microalbuminuria. Start losartan 25mg .  Hyperlipidemia- advised pt to restart statin for CV risk reduction. Continue fenofibrate.  DM2- obtain follow up A1C.  Continue dm diet.

## 2018-01-22 ENCOUNTER — Telehealth: Payer: Self-pay | Admitting: Family

## 2018-01-22 NOTE — Telephone Encounter (Signed)
It looks like pt forgot to go to the lab after her visit. Please ask her to schedule a lab visit.

## 2018-01-22 NOTE — Telephone Encounter (Signed)
Per husband, they thought she was supposed to set up appointment for the lab and she scheduled for 02-14-18.  He asked if she can just keep this appointment.  Please advise if she needs to come in sooner.

## 2018-01-22 NOTE — Telephone Encounter (Signed)
That's fine

## 2018-01-27 NOTE — Telephone Encounter (Signed)
Patient's husband advised 

## 2018-02-14 ENCOUNTER — Encounter: Payer: Self-pay | Admitting: Family

## 2018-02-14 ENCOUNTER — Ambulatory Visit: Payer: BLUE CROSS/BLUE SHIELD | Admitting: Family

## 2018-02-14 VITALS — BP 126/86 | HR 66 | Resp 16 | Ht 61.0 in | Wt 142.0 lb

## 2018-02-14 DIAGNOSIS — I10 Essential (primary) hypertension: Secondary | ICD-10-CM

## 2018-02-14 LAB — BASIC METABOLIC PANEL
BUN: 18 mg/dL (ref 6–23)
CALCIUM: 9.7 mg/dL (ref 8.4–10.5)
CO2: 28 meq/L (ref 19–32)
CREATININE: 0.84 mg/dL (ref 0.40–1.20)
Chloride: 106 mEq/L (ref 96–112)
GFR: 73.05 mL/min (ref >60.00)
GLUCOSE: 114 mg/dL — AB (ref 70–99)
Potassium: 4 mEq/L (ref 3.5–5.1)
SODIUM: 143 meq/L (ref 135–145)

## 2018-02-14 MED ORDER — BETAMETHASONE DIPROPIONATE 0.05 % EX CREA: 1.0000 "application " | TOPICAL_CREAM | Freq: Every day | CUTANEOUS | 1 refills | Status: DC | PRN

## 2018-02-14 NOTE — Patient Instructions (Signed)
Please continue current meds.  Complete lab work prior to leaving.  

## 2018-02-14 NOTE — Progress Notes (Signed)
Subjective:    Patient ID: Katherine Kelly, female    DOB: 20-Apr-1955, 62 y.o.   MRN: [id number]  HPI  Patient is a 62 yr old female who presents today for follow up.   HTN- we started losartan once daily last visit.  Reports tolerating med without difficulty.  BP Readings from Last 3 Encounters:  02/14/18 126/86  01/17/18 138/86  03/20/17 131/77     Review of Systems See HPI  Past Medical History:  Diagnosis Date  . ACE-inhibitor cough   . Diabetes mellitus, type 2 ([place]) 09/2006   no meds  . History of colonic polyps   . Hypertension      Social History   Socioeconomic History  . Marital status: Married    Spouse name: Not on file  . Number of children: Not on file  . Years of education: Not on file  . Highest education level: Not on file  Occupational History  . Not on file  Social Needs  . Financial resource strain: Not on file  . Food insecurity:    Worry: Not on file    Inability: Not on file  . Transportation needs:    Medical: Not on file    Non-medical: Not on file  Tobacco Use  . Smoking status: Never Smoker  . Smokeless tobacco: Never Used  Substance and Sexual Activity  . Alcohol use: No  . Drug use: No  . Sexual activity: Not on file  Lifestyle  . Physical activity:    Days per week: Not on file    Minutes per session: Not on file  . Stress: Not on file  Relationships  . Social connections:    Talks on phone: Not on file    Gets together: Not on file    Attends religious service: Not on file    Active member of club or organization: Not on file    Attends meetings of clubs or organizations: Not on file    Relationship status: Not on file  . Intimate partner violence:    Fear of current or ex partner: Not on file    Emotionally abused: Not on file    Physically abused: Not on file    Forced sexual activity: Not on file  Other Topics Concern  . Not on file  Social History Narrative   Lives with husband and daughter   Freight forwarder at  [employer]   No pets   Did not complete high school   Enjoys travel and shopping    Past Surgical History:  Procedure Laterality Date  . BREAST BIOPSY Left    stereo tatic  . CARDIOVASCULAR STRESS TEST  11/08/2006  . [place]  . COLONOSCOPY    . ESOPHAGOGASTRODUODENOSCOPY  04/2006  . POLYPECTOMY      Family History  Problem Relation Age of Onset  . Colon cancer Neg Hx   . Rectal cancer Neg Hx   . Stomach cancer Neg Hx     Allergies  Allergen Reactions  . Ace Inhibitors     weakness    Current Outpatient Medications on File Prior to Visit  Medication Sig Dispense Refill  . cholecalciferol (VITAMIN D) 1000 UNITS tablet Take 1,000 Units by mouth daily. Reported on 10/25/2015    . Clobetasol Prop Emollient Base 0.05 % emollient cream Apply 1 application topically 2 (two) times daily as needed. 30 g 1  . fenofibrate 160 MG tablet Take 1 tablet (160 mg  total) by mouth daily. 90 tablet 1  . Lancets (ONETOUCH ULTRASOFT) lancets Check Blood sugar twice a day  DX 250.00 200 each 0  . losartan (COZAAR) 25 MG tablet Take 1 tablet (25 mg total) by mouth daily. 90 tablet 1  . simvastatin (ZOCOR) 10 MG tablet Take 1 tablet (10 mg total) by mouth at bedtime. 90 tablet 1   No current facility-administered medications on file prior to visit.     BP 126/86 (BP Location: Right Arm, Cuff Size: Normal)   Pulse 66   Resp 16   Ht 5\' 1"  (1.549 m)   Wt 142 lb (64.4 kg)   SpO2 100%   BMI 26.83 kg/m       Objective:   Physical Exam  Constitutional: She is oriented to person, place, and time. She appears well-developed and well-nourished.  Cardiovascular: Normal rate, regular rhythm and normal heart sounds.  No murmur heard. Pulmonary/Chest: Effort normal and breath sounds normal. No respiratory distress. She has no wheezes.  Neurological: She is alert and oriented to person, place, and time.  Skin: Skin is warm and dry.  Psychiatric: She has a normal mood and  affect. Her behavior is normal. Judgment and thought content normal.          Assessment & Plan:  HTN- improved. Continue losartan, obtain follow up bmet.

## 2018-04-11 ENCOUNTER — Other Ambulatory Visit: Payer: Self-pay | Admitting: Family

## 2018-04-11 NOTE — Telephone Encounter (Signed)
Copied from [place] [id number]. Topic: Quick Communication - Rx Refill/Question >> Apr 11, 2018  3:18 PM [name] E wrote: Medication: fenofibrate 160 MG tablet  and  losartan (COZAAR) 25 MG tablet   Has the patient contacted their pharmacy? Yes ( told to call Dr)   Preferred Pharmacy (with phone number or street name): [id number] [address], [place] [phone] (Phone) [phone] (Fax)    Agent: Please be advised that RX refills may take up to 3 business days. We ask that you follow-up with your pharmacy.

## 2018-04-14 MED ORDER — LOSARTAN POTASSIUM 25 MG PO TABS: 25.0000 mg | ORAL_TABLET | Freq: Every day | ORAL | 1 refills | Status: DC

## 2018-04-14 MED ORDER — FENOFIBRATE 160 MG PO TABS: 160.0000 mg | ORAL_TABLET | Freq: Every day | ORAL | 1 refills | Status: DC

## 2018-06-20 ENCOUNTER — Telehealth: Payer: Self-pay | Admitting: Family

## 2018-06-20 NOTE — Telephone Encounter (Signed)
Copied from [place] [id number]. Topic: Quick Communication - Rx Refill/Question >> Jun 20, 2018  2:26 PM Percell Belt A wrote: Medication: Test Strips for One Touch Ultra 2-  Has the patient contacted their pharmacy? No.-  (Agent: If no, request that the patient contact the pharmacy for the refill.) (Agent: If yes, when and what did the pharmacy advise?)  Preferred Pharmacy (with phone number or street name): [facility] [phone] - [address], [place] [phone] (Phone)   Agent: Please be advised that RX refills may take up to 3 business days. We ask that you follow-up with your pharmacy.

## 2018-06-20 NOTE — Telephone Encounter (Signed)
Patient will need new Rx for test strips- not currently on medication list.

## 2018-06-25 ENCOUNTER — Other Ambulatory Visit: Payer: Self-pay

## 2018-06-25 MED ORDER — GLUCOSE BLOOD VI STRP: ORAL_STRIP | 1 refills | Status: DC

## 2018-06-25 NOTE — Telephone Encounter (Signed)
Strips rx sent in for patient

## 2018-06-30 ENCOUNTER — Telehealth: Payer: Self-pay

## 2018-06-30 MED ORDER — GLUCOSE BLOOD VI STRP: ORAL_STRIP | 12 refills | Status: DC

## 2018-06-30 NOTE — Telephone Encounter (Signed)
Copied from [place] [id number]. Topic: Quick Communication - Rx Refill/Question >> Jun 20, 2018  2:26 PM Percell Belt A wrote: Medication: Test Strips for One Touch Ultra 2-  Has the patient contacted their pharmacy? No.-  (Agent: If no, request that the patient contact the pharmacy for the refill.) (Agent: If yes, when and what did the pharmacy advise?)  Preferred Pharmacy (with phone number or street name): [facility] [phone] - [address], [place] [phone] (Phone)   Agent: Please be advised that RX refills may take up to 3 business days. We ask that you follow-up with your pharmacy. >> Jun 30, 2018  2:50 PM Percell Belt A wrote: Pt husband called in and state that the pharmacy does not have these strips.  He has checked the last couple of day and they have not seen the script .  Can this be resent ?    Best number [phone] Confirmed pharmacy on file is correct

## 2018-06-30 NOTE — Telephone Encounter (Signed)
Test strips resent to [facility].

## 2018-07-12 ENCOUNTER — Other Ambulatory Visit: Payer: Self-pay | Admitting: Family

## 2018-07-12 MED ORDER — SIMVASTATIN 10 MG PO TABS: 10.0000 mg | ORAL_TABLET | Freq: Every day | ORAL | 0 refills | Status: DC

## 2018-07-12 MED ORDER — LOSARTAN POTASSIUM 25 MG PO TABS: 25.0000 mg | ORAL_TABLET | Freq: Every day | ORAL | 0 refills | Status: DC

## 2018-07-12 MED ORDER — FENOFIBRATE 160 MG PO TABS: 160.0000 mg | ORAL_TABLET | Freq: Every day | ORAL | 0 refills | Status: DC

## 2018-07-30 ENCOUNTER — Telehealth: Payer: Self-pay

## 2018-07-30 ENCOUNTER — Other Ambulatory Visit: Payer: Self-pay | Admitting: Family

## 2018-07-30 NOTE — Telephone Encounter (Signed)
Called patient for virtual visit, she is due for follow up since February. Patient's husband said he will talk to his son so that he can call us back to set up.

## 2018-08-08 ENCOUNTER — Other Ambulatory Visit: Payer: Self-pay

## 2018-08-08 ENCOUNTER — Ambulatory Visit (INDEPENDENT_AMBULATORY_CARE_PROVIDER_SITE_OTHER): Payer: Self-pay | Admitting: Family

## 2018-08-08 DIAGNOSIS — I10 Essential (primary) hypertension: Secondary | ICD-10-CM

## 2018-08-08 DIAGNOSIS — N3001 Acute cystitis with hematuria: Secondary | ICD-10-CM

## 2018-08-08 DIAGNOSIS — E785 Hyperlipidemia, unspecified: Secondary | ICD-10-CM

## 2018-08-08 DIAGNOSIS — R809 Proteinuria, unspecified: Secondary | ICD-10-CM

## 2018-08-08 DIAGNOSIS — E1129 Type 2 diabetes mellitus with other diabetic kidney complication: Secondary | ICD-10-CM

## 2018-08-08 MED ORDER — SIMVASTATIN 10 MG PO TABS: 10.0000 mg | ORAL_TABLET | Freq: Every day | ORAL | 1 refills | Status: DC

## 2018-08-08 MED ORDER — FENOFIBRATE 160 MG PO TABS: 160.0000 mg | ORAL_TABLET | Freq: Every day | ORAL | 1 refills | Status: DC

## 2018-08-08 MED ORDER — CEPHALEXIN 500 MG PO CAPS: 500.0000 mg | ORAL_CAPSULE | Freq: Two times a day (BID) | ORAL | 0 refills | Status: AC

## 2018-08-08 MED ORDER — LOSARTAN POTASSIUM 25 MG PO TABS: 25.0000 mg | ORAL_TABLET | Freq: Every day | ORAL | 1 refills | Status: DC

## 2018-08-08 MED ORDER — BETAMETHASONE DIPROPIONATE 0.05 % EX CREA: 1.0000 "application " | TOPICAL_CREAM | Freq: Every day | CUTANEOUS | 1 refills | Status: AC | PRN

## 2018-08-08 NOTE — Progress Notes (Signed)
Virtual Visit via Video Note  I connected with Katherine Kelly on 08/08/18 at  2:40 PM EDT by a video enabled telemedicine application and verified that I am speaking with the correct person using two identifiers. This visit type was conducted due to national recommendations for restrictions regarding the COVID-19 Pandemic (e.g. social distancing).  This format is felt to be most appropriate for this patient at this time.   I discussed the limitations of evaluation and management by telemedicine and the availability of in person appointments. The patient expressed understanding and agreed to proceed.  The patient, her daughter who assists with translation,and myself were on today's video visit. The patient was at work and I was in my office at the time of today's visit.   History of Present Illness:  Patient is a 63 yr old female who presents today with chief complaint of dysuria. Daughter states that her dad was mistaken when he reported that she has constipation.   When she urinates it hurts.  Began last night.  +urinary frequency. No fever.  Small amount of blood. Denies low back pain.  Last BM was this AM x 2.  Reports normal stool.     HTN-  Maintained on losartan 25mg .   BP Readings from Last 3 Encounters:  02/14/18 126/86  01/17/18 138/86  03/20/17 131/77   Hyperlipidemia- maintained on simvastatin 10mg  and fenofibrate.  Lab Results  Component Value Date   CHOL 114 12/04/2016   HDL 51.80 12/04/2016   LDLCALC 40 12/04/2016   LDLDIRECT 64.9 12/17/2006   TRIG 113.0 12/04/2016   CHOLHDL 2 12/04/2016      Observations/Objective:   Gen: Awake, alert, no acute distress Resp: Breathing is even and non-labored Psych: calm/pleasant demeanor Neuro: Alert and Oriented x 3, + facial symmetry, speech is clear.  Assessment and Plan:  UTI- symptoms most consistent with UTI.  Will give trial of keflex. She is advised to call if symptoms fail to improve since we are unable to obtain  culture at this time.  Hyperlipidemia- tolerating statin, plan lipid panel in the office when we are able to safely bring her into the office.  HTN- clinically stable.  Continue losartan. Repeat bp in office when it is safe to do so.   DM2-continues healthy diet.  Will need A1C when we can bring her into the office.  Lab Results  Component Value Date   HGBA1C 6.7 (H) 12/04/2016   HGBA1C 6.6 (H) 06/22/2016   HGBA1C 6.9 (H) 10/25/2015   Lab Results  Component Value Date   MICROALBUR 2.3 (H) 12/14/2014   LDLCALC 40 12/04/2016   CREATININE 0.84 02/14/2018     Follow Up Instructions:  Pt advised to follow up in 3 months.    I discussed the assessment and treatment plan with the patient. The patient was provided an opportunity to ask questions and all were answered. The patient agreed with the plan and demonstrated an understanding of the instructions.   The patient was advised to call back or seek an in-person evaluation if the symptoms worsen or if the condition fails to improve as anticipated.    [name], NP

## 2019-03-31 ENCOUNTER — Encounter: Payer: Self-pay | Admitting: Family

## 2019-03-31 ENCOUNTER — Ambulatory Visit (HOSPITAL_BASED_OUTPATIENT_CLINIC_OR_DEPARTMENT_OTHER)
Admission: RE | Admit: 2019-03-31 | Discharge: 2019-03-31 | Disposition: A | Discharge status: Home / Self Care | Payer: BC Managed Care – PPO | Source: Ambulatory Visit | Attending: Family | Admitting: Family

## 2019-03-31 ENCOUNTER — Other Ambulatory Visit: Payer: Self-pay

## 2019-03-31 ENCOUNTER — Ambulatory Visit (INDEPENDENT_AMBULATORY_CARE_PROVIDER_SITE_OTHER): Payer: BC Managed Care – PPO | Admitting: Family

## 2019-03-31 VITALS — BP 141/77 | HR 75 | Temp 96.8°F | Resp 16 | Ht 62.0 in | Wt 143.0 lb

## 2019-03-31 DIAGNOSIS — S2231XA Fracture of one rib, right side, initial encounter for closed fracture: Secondary | ICD-10-CM | POA: Diagnosis not present

## 2019-03-31 DIAGNOSIS — Z0000 Encounter for general adult medical examination without abnormal findings: Secondary | ICD-10-CM

## 2019-03-31 DIAGNOSIS — E781 Pure hyperglyceridemia: Secondary | ICD-10-CM | POA: Diagnosis not present

## 2019-03-31 DIAGNOSIS — R0781 Pleurodynia: Secondary | ICD-10-CM | POA: Insufficient documentation

## 2019-03-31 DIAGNOSIS — I10 Essential (primary) hypertension: Secondary | ICD-10-CM

## 2019-03-31 DIAGNOSIS — Z23 Encounter for immunization: Secondary | ICD-10-CM | POA: Diagnosis not present

## 2019-03-31 DIAGNOSIS — E1129 Type 2 diabetes mellitus with other diabetic kidney complication: Secondary | ICD-10-CM | POA: Diagnosis not present

## 2019-03-31 DIAGNOSIS — E785 Hyperlipidemia, unspecified: Secondary | ICD-10-CM | POA: Diagnosis not present

## 2019-03-31 DIAGNOSIS — Z1159 Encounter for screening for other viral diseases: Secondary | ICD-10-CM

## 2019-03-31 DIAGNOSIS — R809 Proteinuria, unspecified: Secondary | ICD-10-CM | POA: Diagnosis not present

## 2019-03-31 DIAGNOSIS — Z114 Encounter for screening for human immunodeficiency virus [HIV]: Secondary | ICD-10-CM

## 2019-03-31 MED ORDER — FENOFIBRATE 160 MG PO TABS: 160.0000 mg | ORAL_TABLET | Freq: Every day | ORAL | 1 refills | Status: DC

## 2019-03-31 MED ORDER — SIMVASTATIN 10 MG PO TABS: 10.0000 mg | ORAL_TABLET | Freq: Every day | ORAL | 1 refills | Status: DC

## 2019-03-31 MED ORDER — LOSARTAN POTASSIUM 25 MG PO TABS: 25.0000 mg | ORAL_TABLET | Freq: Every day | ORAL | 1 refills | Status: DC

## 2019-03-31 NOTE — Progress Notes (Signed)
Subjective:    Patient ID: Katherine Kelly, female    DOB: Aug 23, 1955, 63 y.o.   MRN: [id number]  HPI  Patient is a 63 yr old female who presents today with chief complaint of right sided lateral rib pain.  Reports that pain began 6 weeks ago following [place] the beach. Reports that has been using advil with brief improvement.  Her husband is present for today's appointment and helps to translate.  DM2- diet controlled. Reports sugar yesterday 134. Reports that her last eye exam was 2 years ago.   Lab Results  Component Value Date   HGBA1C 6.7 (H) 12/04/2016   HGBA1C 6.6 (H) 06/22/2016   HGBA1C 6.9 (H) 10/25/2015   Lab Results  Component Value Date   MICROALBUR 2.3 (H) 12/14/2014   LDLCALC 40 12/04/2016   CREATININE 0.84 02/14/2018   HTN- maintained on losartan.  BP Readings from Last 3 Encounters:  03/31/19 (!) 141/77  02/14/18 126/86  01/17/18 138/86   Hyperlipidemia- maintained on fenofibrate and simvastatin.  Lab Results  Component Value Date   CHOL 114 12/04/2016   HDL 51.80 12/04/2016   LDLCALC 40 12/04/2016   LDLDIRECT 64.9 12/17/2006   TRIG 113.0 12/04/2016   CHOLHDL 2 12/04/2016       Review of Systems See HPI  Past Medical History:  Diagnosis Date  . ACE-inhibitor cough   . Diabetes mellitus, type 2 ([place]) 09/2006   no meds  . History of colonic polyps   . Hypertension      Social History   Socioeconomic History  . Marital status: Married    Spouse name: Not on file  . Number of children: Not on file  . Years of education: Not on file  . Highest education level: Not on file  Occupational History  . Not on file  Tobacco Use  . Smoking status: Never Smoker  . Smokeless tobacco: Never Used  Substance and Sexual Activity  . Alcohol use: No  . Drug use: No  . Sexual activity: Not on file  Other Topics Concern  . Not on file  Social History Narrative   Lives with husband and daughter   Freight forwarder at [employer]   No pets   Did not  complete high school   Enjoys travel and shopping   Social Determinants of Health   Financial Resource Strain:   . Difficulty of Paying Living Expenses: Not on file  Food Insecurity:   . Worried About Charity fundraiser in the Last Year: Not on file  . Ran Out of Food in the Last Year: Not on file  Transportation Needs:   . Lack of Transportation (Medical): Not on file  . Lack of Transportation (Non-Medical): Not on file  Physical Activity:   . Days of Exercise per Week: Not on file  . Minutes of Exercise per Session: Not on file  Stress:   . Feeling of Stress : Not on file  Social Connections:   . Frequency of Communication with Friends and Family: Not on file  . Frequency of Social Gatherings with Friends and Family: Not on file  . Attends Religious Services: Not on file  . Active Member of Clubs or Organizations: Not on file  . Attends Archivist Meetings: Not on file  . Marital Status: Not on file  Intimate Partner Violence:   . Fear of Current or Ex-Partner: Not on file  . Emotionally Abused: Not on file  . Physically Abused:  Not on file  . Sexually Abused: Not on file    Past Surgical History:  Procedure Laterality Date  . BREAST BIOPSY Left    stereo tatic  . CARDIOVASCULAR STRESS TEST  11/08/2006  . [place]  . COLONOSCOPY    . ESOPHAGOGASTRODUODENOSCOPY  04/2006  . POLYPECTOMY      Family History  Problem Relation Age of Onset  . Colon cancer Neg Hx   . Rectal cancer Neg Hx   . Stomach cancer Neg Hx     Allergies  Allergen Reactions  . Ace Inhibitors     weakness    Current Outpatient Medications on File Prior to Visit  Medication Sig Dispense Refill  . betamethasone dipropionate (DIPROLENE) 0.05 % cream Apply 1 application topically daily as needed. 30 g 1  . cholecalciferol (VITAMIN D) 1000 UNITS tablet Take 1,000 Units by mouth daily. Reported on 10/25/2015    . Clobetasol Prop Emollient Base 0.05 % emollient cream  Apply 1 application topically 2 (two) times daily as needed. 30 g 1  . fenofibrate 160 MG tablet Take 1 tablet (160 mg total) by mouth daily. 90 tablet 1  . glucose blood test strip Check blood sugars twice daily 200 each 12  . Lancets (ONETOUCH ULTRASOFT) lancets Check Blood sugar twice a day  DX 250.00 200 each 0  . losartan (COZAAR) 25 MG tablet Take 1 tablet (25 mg total) by mouth daily. 90 tablet 1  . simvastatin (ZOCOR) 10 MG tablet Take 1 tablet (10 mg total) by mouth at bedtime. 90 tablet 1   No current facility-administered medications on file prior to visit.    BP (!) 141/77 (BP Location: Right Arm, Patient Position: Sitting, Cuff Size: Small)   Pulse 75   Temp (!) 96.8 F (36 C) (Temporal)   Resp 16   Ht 5\' 2"  (1.575 m)   Wt 143 lb (64.9 kg)   SpO2 99%   BMI 26.16 kg/m       Objective:   Physical Exam Constitutional:      Appearance: She is well-developed.  Neck:     Thyroid: No thyromegaly.  Cardiovascular:     Rate and Rhythm: Normal rate and regular rhythm.     Heart sounds: Normal heart sounds. No murmur.  Pulmonary:     Effort: Pulmonary effort is normal. No respiratory distress.     Breath sounds: Normal breath sounds. No wheezing.  Musculoskeletal:     Cervical back: Neck supple.     Comments: Positive right lateral rib tenderness to palpation.  Skin:    General: Skin is warm and dry.  Neurological:     Mental Status: She is alert and oriented to person, place, and time.  Psychiatric:        Behavior: Behavior normal.        Thought Content: Thought content normal.        Judgment: Judgment normal.           Assessment & Plan:  Right rib fracture-x-rays performed and notes the following:Subacute appearing fracture of the anterior right sixth rib. Nondisplaced fracture anterolateral right seventh rib. No pneumothorax. No edema or consolidation. Mild cardiac enlargement, Stable.  Patient is advised to use Tylenol as needed.  Discussed the  this should continue to heal on its own.  Diabetes type 2-follow-up A1c is elevated.  We will add Metformin 500 mg twice daily.  Please see phone note. Lab Results  Component Value Date  HGBA1C 7.7 (H) 03/31/2019   Hypertension-blood pressure is fair.  Continue current medication.  Hyperlipidemia-LDL at goal.  Continue atorvastatin and fenofibrate. Lab Results  Component Value Date   CHOL 123 03/31/2019   HDL 54.50 03/31/2019   LDLCALC 39 03/31/2019   LDLDIRECT 64.9 12/17/2006   TRIG 149.0 03/31/2019   CHOLHDL 2 03/31/2019

## 2019-03-31 NOTE — Patient Instructions (Signed)
Please complete lab work prior to leaving. Complete rib x-ray and schedule mammogram on the first floor.

## 2019-04-01 ENCOUNTER — Telehealth: Payer: Self-pay | Admitting: Family

## 2019-04-01 DIAGNOSIS — R0781 Pleurodynia: Secondary | ICD-10-CM | POA: Diagnosis not present

## 2019-04-01 DIAGNOSIS — Z23 Encounter for immunization: Secondary | ICD-10-CM | POA: Diagnosis not present

## 2019-04-01 LAB — COMPREHENSIVE METABOLIC PANEL
ALT: 18 U/L (ref 0–35)
AST: 20 U/L (ref 0–37)
Albumin: 4.6 g/dL (ref 3.5–5.2)
Alkaline Phosphatase: 61 U/L (ref 39–117)
BUN: 19 mg/dL (ref 6–23)
CO2: 26 mEq/L (ref 19–32)
Calcium: 9.2 mg/dL (ref 8.4–10.5)
Chloride: 109 mEq/L (ref 96–112)
Creatinine, Ser: 0.91 mg/dL (ref 0.40–1.20)
GFR: 62.44 mL/min (ref >60.00)
Glucose, Bld: 235 mg/dL — ABNORMAL HIGH (ref 70–99)
Potassium: 3.7 mEq/L (ref 3.5–5.1)
Sodium: 142 mEq/L (ref 135–145)
Total Bilirubin: 0.5 mg/dL (ref 0.2–1.2)
Total Protein: 7.3 g/dL (ref 6.0–8.3)

## 2019-04-01 LAB — HEPATITIS C ANTIBODY
Hepatitis C Ab: NONREACTIVE
SIGNAL TO CUT-OFF: 0.01 (ref <1.00)

## 2019-04-01 LAB — LIPID PANEL
Cholesterol: 123 mg/dL (ref 0–200)
HDL: 54.5 mg/dL (ref >39.00)
LDL Cholesterol: 39 mg/dL (ref 0–99)
NonHDL: 68.46
Total CHOL/HDL Ratio: 2
Triglycerides: 149 mg/dL (ref 0.0–149.0)
VLDL: 29.8 mg/dL (ref 0.0–40.0)

## 2019-04-01 LAB — HIV ANTIBODY (ROUTINE TESTING W REFLEX): HIV 1&2 Ab, 4th Generation: NONREACTIVE

## 2019-04-01 LAB — HEMOGLOBIN A1C: Hgb A1c MFr Bld: 7.7 % — ABNORMAL HIGH (ref 4.6–6.5)

## 2019-04-01 MED ORDER — METFORMIN HCL 500 MG PO TABS: 500.0000 mg | ORAL_TABLET | Freq: Two times a day (BID) | ORAL | 3 refills | Status: DC

## 2019-04-01 NOTE — Telephone Encounter (Signed)
Also, her sugar is above goal.  I would recommend that she continue to work on diet and add Metformin 500 mg twice daily.

## 2019-04-01 NOTE — Telephone Encounter (Signed)
Please contact patient and let her know that her x-ray does show rib fracture.  I would recommend that she use Tylenol as needed for pain.  This should continue to heal on its own.  There is no sign of lung damage from the fracture.

## 2019-04-01 NOTE — Telephone Encounter (Signed)
Results given to patient's husband. He verbalize understanding and will go get her new medication and continue to work on her diet.  He was glad the lung was not damage and will advised her about the xray results

## 2019-04-02 ENCOUNTER — Ambulatory Visit (HOSPITAL_BASED_OUTPATIENT_CLINIC_OR_DEPARTMENT_OTHER): Payer: BC Managed Care – PPO

## 2019-04-09 ENCOUNTER — Ambulatory Visit (HOSPITAL_BASED_OUTPATIENT_CLINIC_OR_DEPARTMENT_OTHER)
Admission: RE | Admit: 2019-04-09 | Discharge: 2019-04-09 | Disposition: A | Discharge status: Home / Self Care | Payer: BC Managed Care – PPO | Source: Ambulatory Visit | Attending: Family | Admitting: Family

## 2019-04-09 ENCOUNTER — Other Ambulatory Visit: Payer: Self-pay

## 2019-04-09 DIAGNOSIS — Z1231 Encounter for screening mammogram for malignant neoplasm of breast: Secondary | ICD-10-CM | POA: Insufficient documentation

## 2019-04-09 DIAGNOSIS — Z0000 Encounter for general adult medical examination without abnormal findings: Secondary | ICD-10-CM

## 2019-07-15 ENCOUNTER — Other Ambulatory Visit: Payer: Self-pay

## 2019-07-15 ENCOUNTER — Telehealth: Payer: Self-pay

## 2019-07-15 MED ORDER — METFORMIN HCL 500 MG PO TABS: 500.0000 mg | ORAL_TABLET | Freq: Two times a day (BID) | ORAL | 3 refills | Status: DC

## 2019-07-15 MED ORDER — FENOFIBRATE 160 MG PO TABS: 160.0000 mg | ORAL_TABLET | Freq: Every day | ORAL | 1 refills | Status: DC

## 2019-07-15 MED ORDER — LOSARTAN POTASSIUM 25 MG PO TABS: 25.0000 mg | ORAL_TABLET | Freq: Every day | ORAL | 1 refills | Status: DC

## 2019-07-15 NOTE — Telephone Encounter (Signed)
Patient called in to see if NP [place] could send in a prescription for  metFORMIN (GLUCOPHAGE) 500 MG tablet [id number]    fenofibrate 160 MG tablet [id number]    losartan (COZAAR) 25 MG tablet [id number]    Please send it to [id number] [address], [place] AT [place]  [address] [place] [address]  Phone:  [phone] Fax:  [phone]  DEA #:  [id number]

## 2020-01-11 ENCOUNTER — Telehealth: Payer: Self-pay | Admitting: Family

## 2020-01-11 MED ORDER — LOSARTAN POTASSIUM 25 MG PO TABS: 25.0000 mg | ORAL_TABLET | Freq: Every day | ORAL | 0 refills | Status: DC

## 2020-01-11 MED ORDER — SIMVASTATIN 10 MG PO TABS: 10.0000 mg | ORAL_TABLET | Freq: Every day | ORAL | 0 refills | Status: DC

## 2020-01-11 NOTE — Telephone Encounter (Signed)
Refills sent for 30 days supply, patient was scheduled for past due follow up on 01-18-20

## 2020-01-11 NOTE — Telephone Encounter (Signed)
Medication: simvastatin (ZOCOR) 10 MG tablet [[id number]] losartan (COZAAR) 25 MG tablet [[id number]]    Has the patient contacted their pharmacy? No. (If no, request that the patient contact the pharmacy for the refill.) (If yes, when and what did the pharmacy advise?)  Preferred Pharmacy (with phone number or street name): [id number] [address], [place] [address] [place]  [address] [place] [address]  Phone:  [phone] Fax:  [phone]  DEA #:  [id number]  Agent: Please be advised that RX refills may take up to 3 business days. We ask that you follow-up with your pharmacy.

## 2020-01-18 ENCOUNTER — Other Ambulatory Visit: Payer: Self-pay

## 2020-01-18 ENCOUNTER — Encounter: Payer: Self-pay | Admitting: Family

## 2020-01-18 ENCOUNTER — Ambulatory Visit (INDEPENDENT_AMBULATORY_CARE_PROVIDER_SITE_OTHER): Payer: BLUE CROSS/BLUE SHIELD | Admitting: Family

## 2020-01-18 VITALS — BP 130/89 | HR 96 | Temp 98.1°F | Resp 16 | Wt 135.0 lb

## 2020-01-18 DIAGNOSIS — E1129 Type 2 diabetes mellitus with other diabetic kidney complication: Secondary | ICD-10-CM | POA: Diagnosis not present

## 2020-01-18 DIAGNOSIS — E781 Pure hyperglyceridemia: Secondary | ICD-10-CM

## 2020-01-18 DIAGNOSIS — I10 Essential (primary) hypertension: Secondary | ICD-10-CM | POA: Diagnosis not present

## 2020-01-18 DIAGNOSIS — E785 Hyperlipidemia, unspecified: Secondary | ICD-10-CM

## 2020-01-18 DIAGNOSIS — R809 Proteinuria, unspecified: Secondary | ICD-10-CM

## 2020-01-18 MED ORDER — SIMVASTATIN 10 MG PO TABS: 10.0000 mg | ORAL_TABLET | Freq: Every day | ORAL | 1 refills | Status: DC

## 2020-01-18 MED ORDER — METFORMIN HCL 500 MG PO TABS: 500.0000 mg | ORAL_TABLET | Freq: Two times a day (BID) | ORAL | 1 refills | Status: DC

## 2020-01-18 MED ORDER — LOSARTAN POTASSIUM 25 MG PO TABS: 25.0000 mg | ORAL_TABLET | Freq: Every day | ORAL | 1 refills | Status: DC

## 2020-01-18 MED ORDER — FENOFIBRATE 160 MG PO TABS: 160.0000 mg | ORAL_TABLET | Freq: Every day | ORAL | 1 refills | Status: DC

## 2020-01-18 NOTE — Progress Notes (Signed)
Subjective:    Patient ID: Katherine Kelly, female    DOB: July 20, 1955, 64 y.o.   MRN: [id number]  HPI  Patient is a 64 yr old female who presents today for follow up.  HTN-  Maintained on losartan 25mg  once daily.  BP Readings from Last 3 Encounters:  01/18/20 130/89  03/31/19 (!) 141/77  02/14/18 126/86   DM2- maintained on metformin 500mg  bid. Reports sugars up to 130.   Lab Results  Component Value Date   HGBA1C 7.7 (H) 03/31/2019   HGBA1C 6.7 (H) 12/04/2016   HGBA1C 6.6 (H) 06/22/2016   Lab Results  Component Value Date   MICROALBUR 2.3 (H) 12/14/2014   LDLCALC 39 03/31/2019   CREATININE 0.91 03/31/2019   Hyperlipidemia- maintained on simvastatin 10mg  once daily.  Lab Results  Component Value Date   CHOL 123 03/31/2019   HDL 54.50 03/31/2019   LDLCALC 39 03/31/2019   LDLDIRECT 64.9 12/17/2006   TRIG 149.0 03/31/2019   CHOLHDL 2 03/31/2019       Review of Systems See HPI  Past Medical History:  Diagnosis Date  . ACE-inhibitor cough   . Diabetes mellitus, type 2 ([place]) 09/2006   no meds  . History of colonic polyps   . Hypertension      Social History   Socioeconomic History  . Marital status: Married    Spouse name: Not on file  . Number of children: Not on file  . Years of education: Not on file  . Highest education level: Not on file  Occupational History  . Not on file  Tobacco Use  . Smoking status: Never Smoker  . Smokeless tobacco: Never Used  Substance and Sexual Activity  . Alcohol use: No  . Drug use: No  . Sexual activity: Not on file  Other Topics Concern  . 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 Difficulty of Paying Living Expenses: Not on file  Food Insecurity:   . Worried About Charity fundraiser in the Last Year: Not on  file  . Ran Out of Food in the Last Year: Not on file  Transportation Needs:   . Lack of Transportation (Medical): Not on file  . Lack of Transportation (Non-Medical): Not on file  Physical Activity:   . Days of Exercise per Week: Not on file  . Minutes of Exercise per Session: Not on file  Stress:   . Feeling of Stress : Not on file  Social Connections:   . Frequency of Communication with Friends and Family: Not on file  . Frequency of Social Gatherings with Friends and Family: Not on file  . Attends Religious Services: Not on file  . Active Member of Clubs or Organizations: Not on file  . Attends Archivist Meetings: Not on file  . Marital Status: Not on file  Intimate Partner Violence:   . Fear of Current or Ex-Partner: Not on file  . Emotionally Abused: Not on file  . Physically Abused: Not on file  . Sexually Abused: Not on file    Past Surgical History:  Procedure Laterality Date  . BREAST BIOPSY Left    stereo tatic  . CARDIOVASCULAR STRESS TEST  11/08/2006  . CESAREAN SECTION  [place]  . COLONOSCOPY    . ESOPHAGOGASTRODUODENOSCOPY  04/2006  . POLYPECTOMY      Family History  Problem Relation Age of Onset  . Colon cancer Neg Hx   . Rectal cancer Neg Hx   . Stomach cancer Neg Hx     Allergies  Allergen Reactions  . Ace Inhibitors     weakness    Current Outpatient Medications on File Prior to Visit  Medication Sig Dispense Refill  . betamethasone dipropionate (DIPROLENE) 0.05 % cream Apply 1 application topically daily as needed. 30 g 1  . cholecalciferol (VITAMIN D) 1000 UNITS tablet Take 1,000 Units by mouth daily. Reported on 10/25/2015    . Clobetasol Prop Emollient Base 0.05 % emollient cream Apply 1 application topically 2 (two) times daily as needed. 30 g 1  . glucose blood test strip Check blood sugars twice daily 200 each 12  . Lancets (ONETOUCH ULTRASOFT) lancets Check Blood sugar twice a day  DX 250.00 200 each 0   No current  facility-administered medications on file prior to visit.    BP 130/89 (BP Location: Right Arm, Patient Position: Sitting, Cuff Size: Small)   Pulse 96   Temp 98.1 F (36.7 C) (Oral)   Resp 16   Wt 135 lb (61.2 kg)   SpO2 98%   BMI 24.69 kg/m       Objective:   Physical Exam Constitutional:      Appearance: She is well-developed.  Neck:     Thyroid: No thyromegaly.  Cardiovascular:     Rate and Rhythm: Normal rate and regular rhythm.     Heart sounds: Normal heart sounds. No murmur heard.   Pulmonary:     Effort: Pulmonary effort is normal. No respiratory distress.     Breath sounds: Normal breath sounds. No wheezing.  Musculoskeletal:     Cervical back: Neck supple.  Skin:    General: Skin is warm and dry.  Neurological:     Mental Status: She is alert and oriented to person, place, and time.  Psychiatric:        Behavior: Behavior normal.        Thought Content: Thought content normal.        Judgment: Judgment normal.           Assessment & Plan:  HTN- bp stable, continue losartan 25mg  once daily.  DM2- clinically stable. Obtain A1C. Continue metformin 500mg  bid.  Hyperlipidemia- tolerating simvastatin 10mg . Continue same. Obtain follow up lipid panel.   This visit occurred during the SARS-CoV-2 public health emergency.  Safety protocols were in place, including screening questions prior to the visit, additional usage of staff PPE, and extensive cleaning of exam room while observing appropriate contact time as indicated for disinfecting solutions.

## 2020-01-18 NOTE — Patient Instructions (Signed)
Please consider getting your covid vaccine. Complete lab work prior to leaving.

## 2020-01-19 ENCOUNTER — Encounter: Payer: Self-pay | Admitting: Family

## 2020-01-19 LAB — BASIC METABOLIC PANEL
BUN: 23 mg/dL (ref 7–25)
CO2: 26 mmol/L (ref 20–32)
Calcium: 10.4 mg/dL (ref 8.6–10.4)
Chloride: 104 mmol/L (ref 98–110)
Creat: 0.85 mg/dL (ref 0.50–0.99)
Glucose, Bld: 116 mg/dL — ABNORMAL HIGH (ref 65–99)
Potassium: 4.8 mmol/L (ref 3.5–5.3)
Sodium: 140 mmol/L (ref 135–146)

## 2020-01-19 LAB — HEMOGLOBIN A1C
Hgb A1c MFr Bld: 6.5 % of total Hgb — ABNORMAL HIGH (ref <5.7)
Mean Plasma Glucose: 140 (calc)
eAG (mmol/L): 7.7 (calc)

## 2020-01-19 LAB — LIPID PANEL
Cholesterol: 118 mg/dL (ref <200)
HDL: 53 mg/dL (ref >=50)
LDL Cholesterol (Calc): 48 mg/dL (calc)
Non-HDL Cholesterol (Calc): 65 mg/dL (calc) (ref <130)
Total CHOL/HDL Ratio: 2.2 (calc) (ref <5.0)
Triglycerides: 85 mg/dL (ref <150)

## 2020-01-19 NOTE — Progress Notes (Signed)
Mailed out to patient 

## 2020-02-05 ENCOUNTER — Other Ambulatory Visit: Payer: Self-pay | Admitting: Family

## 2020-02-05 ENCOUNTER — Telehealth: Payer: Self-pay | Admitting: Family

## 2020-02-05 NOTE — Telephone Encounter (Signed)
Medication: simvastatin (ZOCOR) 10 MG tablet  Has the patient contacted their pharmacy? No. (If no, request that the patient contact the pharmacy for the refill.) (If yes, when and what did the pharmacy advise?)  Preferred Pharmacy (with phone number or street name): [id number] [place], [place] [address] [place]  [address] [place] [address]  Phone:  [phone] Fax:  [phone]  DEA #:  [id number]  Agent: Please be advised that RX refills may take up to 3 business days. We ask that you follow-up with your pharmacy.

## 2020-02-05 NOTE — Telephone Encounter (Signed)
Patient 's husband advised medication was sent in on Oct 4th

## 2020-02-26 ENCOUNTER — Other Ambulatory Visit: Payer: Self-pay | Admitting: Family

## 2020-05-20 ENCOUNTER — Other Ambulatory Visit (HOSPITAL_BASED_OUTPATIENT_CLINIC_OR_DEPARTMENT_OTHER): Payer: Self-pay | Admitting: Family

## 2020-05-20 DIAGNOSIS — Z1231 Encounter for screening mammogram for malignant neoplasm of breast: Secondary | ICD-10-CM

## 2020-05-24 ENCOUNTER — Encounter (HOSPITAL_BASED_OUTPATIENT_CLINIC_OR_DEPARTMENT_OTHER): Payer: Self-pay

## 2020-05-24 ENCOUNTER — Other Ambulatory Visit: Payer: Self-pay

## 2020-05-24 ENCOUNTER — Ambulatory Visit (HOSPITAL_BASED_OUTPATIENT_CLINIC_OR_DEPARTMENT_OTHER)
Admission: RE | Admit: 2020-05-24 | Discharge: 2020-05-24 | Disposition: A | Discharge status: Home / Self Care | Payer: 59 | Source: Ambulatory Visit | Attending: Family | Admitting: Family

## 2020-05-24 DIAGNOSIS — Z1231 Encounter for screening mammogram for malignant neoplasm of breast: Secondary | ICD-10-CM | POA: Insufficient documentation

## 2020-07-11 ENCOUNTER — Other Ambulatory Visit: Payer: Self-pay

## 2020-07-11 ENCOUNTER — Telehealth: Payer: Self-pay | Admitting: Family

## 2020-07-11 MED ORDER — FENOFIBRATE 160 MG PO TABS: 160.0000 mg | ORAL_TABLET | Freq: Every day | ORAL | 1 refills | Status: DC

## 2020-07-11 NOTE — Telephone Encounter (Signed)
Refill was sent to her pharmacy and past due follow up appointment scheduled for Monday.

## 2020-07-11 NOTE — Telephone Encounter (Signed)
Medication:fenofibrate 160 MG tablet [[id number]]       Has the patient contacted their pharmacy?  (If no, request that the patient contact the pharmacy for the refill.) (If yes, when and what did the pharmacy advise?)     Preferred Pharmacy (with phone number or street name): [facility], [place] - [place] AT [facility]  [place] [address]  Phone:  [phone] Fax:  [phone]     Agent: Please be advised that RX refills may take up to 3 business days. We ask that you follow-up with your pharmacy.

## 2020-07-18 ENCOUNTER — Encounter: Payer: Self-pay | Admitting: Family

## 2020-07-18 ENCOUNTER — Other Ambulatory Visit: Payer: Self-pay

## 2020-07-18 ENCOUNTER — Ambulatory Visit (INDEPENDENT_AMBULATORY_CARE_PROVIDER_SITE_OTHER): Payer: 59 | Admitting: Family

## 2020-07-18 VITALS — BP 140/90 | HR 92 | Temp 98.2°F | Resp 16 | Ht 62.0 in | Wt 138.0 lb

## 2020-07-18 DIAGNOSIS — E785 Hyperlipidemia, unspecified: Secondary | ICD-10-CM

## 2020-07-18 DIAGNOSIS — E1129 Type 2 diabetes mellitus with other diabetic kidney complication: Secondary | ICD-10-CM

## 2020-07-18 DIAGNOSIS — R87610 Atypical squamous cells of undetermined significance on cytologic smear of cervix (ASC-US): Secondary | ICD-10-CM | POA: Diagnosis not present

## 2020-07-18 DIAGNOSIS — J302 Other seasonal allergic rhinitis: Secondary | ICD-10-CM | POA: Diagnosis not present

## 2020-07-18 DIAGNOSIS — I700 Atherosclerosis of aorta: Secondary | ICD-10-CM | POA: Diagnosis not present

## 2020-07-18 DIAGNOSIS — R809 Proteinuria, unspecified: Secondary | ICD-10-CM

## 2020-07-18 DIAGNOSIS — I10 Essential (primary) hypertension: Secondary | ICD-10-CM

## 2020-07-18 DIAGNOSIS — Z2821 Immunization not carried out because of patient refusal: Secondary | ICD-10-CM

## 2020-07-18 LAB — BASIC METABOLIC PANEL
BUN: 14 mg/dL (ref 6–23)
CO2: 29 mEq/L (ref 19–32)
Calcium: 10.1 mg/dL (ref 8.4–10.5)
Chloride: 99 mEq/L (ref 96–112)
Creatinine, Ser: 0.84 mg/dL (ref 0.40–1.20)
GFR: 73.51 mL/min (ref >60.00)
Glucose, Bld: 125 mg/dL — ABNORMAL HIGH (ref 70–99)
Potassium: 4.2 mEq/L (ref 3.5–5.1)
Sodium: 138 mEq/L (ref 135–145)

## 2020-07-18 LAB — HEMOGLOBIN A1C: Hgb A1c MFr Bld: 7 % — ABNORMAL HIGH (ref 4.6–6.5)

## 2020-07-18 MED ORDER — METFORMIN HCL 500 MG PO TABS: 500.0000 mg | ORAL_TABLET | Freq: Two times a day (BID) | ORAL | 1 refills | Status: DC

## 2020-07-18 NOTE — Patient Instructions (Addendum)
Please restart Allertec once daily.   Please schedule a follow up appointment with your eye doctor. You should be contacted about scheduling your follow up with the GYN doctor. Please consider getting your COVID vaccine to decrease your chance of serious illness/death from COVID-19. Let me know when you are ready to schedule your follow up colonoscopy.

## 2020-07-18 NOTE — Progress Notes (Signed)
Subjective:    Patient ID: Katherine Kelly, female    DOB: 1955/08/14, 64 y.o.   MRN: [id number]  HPI  Patient is a 65 yr old female who presents today for DM follow up. She is accompanied by her daughter today who helps with translation. They declined formal interpretor.   DM2- maintained on metformin 500mg  bid.   Lab Results  Component Value Date   HGBA1C 6.5 (H) 01/18/2020   HGBA1C 7.7 (H) 03/31/2019   HGBA1C 6.7 (H) 12/04/2016   Lab Results  Component Value Date   MICROALBUR 2.3 (H) 12/14/2014   LDLCALC 48 01/18/2020   CREATININE 0.85 01/18/2020   HTN- maintained on losartan 25mg  once daily. Reports good compliance.   BP Readings from Last 3 Encounters:  07/18/20 140/90  01/18/20 130/89  03/31/19 (!) 141/77   Hyperlipidemia- maintained on simvastatin 10mg  once daily.  Lab Results  Component Value Date   CHOL 118 01/18/2020   HDL 53 01/18/2020   LDLCALC 48 01/18/2020   LDLDIRECT 64.9 12/17/2006   TRIG 85 01/18/2020   CHOLHDL 2.2 01/18/2020   Some cough, itching/watering eyes but not taking.  Using Delsym PRN  Review of Systems See HPI  Past Medical History:  Diagnosis Date  . ACE-inhibitor cough   . Diabetes mellitus, type 2 ([place]) 09/2006   no meds  . History of colonic polyps   . Hypertension      Social History   Socioeconomic History  . Marital status: Married    Spouse name: Not on file  . Number of children: Not on file  . Years of education: Not on file  . Highest education level: Not on file  Occupational History  . Not on file  Tobacco Use  . Smoking status: Never Smoker  . Smokeless tobacco: Never Used  Substance and Sexual Activity  . Alcohol use: No  . Drug use: No  . Sexual activity: Not on file  Other Topics Concern  . 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 BREAST BIOPSY Left    stereo tatic  . CARDIOVASCULAR STRESS TEST  11/08/2006  . [place]  . COLONOSCOPY    . ESOPHAGOGASTRODUODENOSCOPY  04/2006  . POLYPECTOMY      Family History  Problem Relation Age of Onset  . Colon cancer Neg Hx   . Rectal cancer Neg Hx   . Stomach cancer Neg Hx     Allergies  Allergen Reactions  . Ace Inhibitors     weakness    Current Outpatient Medications on File Prior to Visit  Medication Sig Dispense Refill  . betamethasone dipropionate (DIPROLENE) 0.05 % cream Apply 1 application topically daily as needed. 30 g 1  . cholecalciferol (VITAMIN D) 1000 UNITS tablet Take 1,000 Units by mouth daily. Reported on 10/25/2015    . Clobetasol Prop Emollient Base 0.05 % emollient cream Apply 1 application topically 2 (two) times daily as needed. [place]  g 1  . fenofibrate 160 MG tablet Take 1 tablet (160 mg total) by mouth daily. 90 tablet 1  . glucose blood test strip Check blood sugars twice daily 200 each 12  . Lancets (ONETOUCH ULTRASOFT) lancets Check Blood sugar twice a day  DX 250.00 200 each 0  . losartan (COZAAR) 25 MG tablet TAKE 1 TABLET(25 MG) BY MOUTH DAILY 90 tablet 1  . simvastatin (ZOCOR) 10 MG tablet TAKE 1 TABLET(10 MG) BY MOUTH AT BEDTIME 90 tablet 1   No current facility-administered medications on file prior to visit.    BP 140/90   Pulse 92   Temp 98.2 F (36.8 C) (Oral)   Resp 16   Ht 5\' 2"  (1.575 m)   Wt 138 lb (62.6 kg)   SpO2 99%   BMI 25.24 kg/m       Objective:   Physical Exam Constitutional:      Appearance: She is well-developed.  HENT:     Mouth/Throat:     Mouth: Mucous membranes are dry.     Pharynx: No  posterior oropharyngeal erythema.     Comments: Exam of posterior oropharynx is limited due to thick posterior tongue Cardiovascular:     Rate and Rhythm: Normal rate and regular rhythm.     Heart sounds: Murmur heard.   Crescendo systolic murmur is present with a grade of 3/6.   Pulmonary:     Effort: Pulmonary effort is normal. No respiratory distress.     Breath sounds: Normal breath sounds. No wheezing.  Skin:    General: Skin is dry.  Neurological:     Mental Status: She is oriented to person, place, and time.  Psychiatric:        Behavior: Behavior normal.        Thought Content: Thought content normal.        Judgment: Judgment normal.           Assessment & Plan:  DM2- clinically stable on metformin 500mg  bid. Obtain A1C.  HTN- mild elevation of BP today. Repeat bp better than initial upon arrival. Will continue losartan 25mg  once daily and plan to repeat in 3 months. Would consider increasing to 50mg  at that time if it remains >130/80.  Hyperlipidemia- lipids at goal. Continue fenofibrate 160mg  and simvastatin 10mg .   Seasonal allergies- recommended that she resume generic zyrtec once daily and let me know if mild cough/itching eyes do not improve. Normal lung exam today.  covid-19 vaccine counseling- discussed importance of vaccination given her health problems and age. Pt declines at this time.  Hx of colon polyps- advised pt that she is overdue for follow up colonoscopy which was due in 2019. She declines at this time, but will let me know when she is ready.  Murmur- hx of aortic calcification on echo 2017. Murmur sounds louder to me. Will repeat 2D echo.   Murmur-  Hx of Ascus pap- due for GYN follow up. Will arrange.    This visit occurred during the SARS-CoV-2 public health emergency.  Safety protocols were in place, including screening questions prior to the visit, additional usage of staff PPE, and extensive cleaning of exam room while observing  appropriate contact time as indicated for disinfecting solutions.

## 2020-07-19 NOTE — Progress Notes (Signed)
Mailed out tot pt

## 2020-07-21 ENCOUNTER — Telehealth: Payer: Self-pay

## 2020-07-25 ENCOUNTER — Telehealth: Payer: Self-pay

## 2020-08-08 ENCOUNTER — Telehealth: Payer: Self-pay | Admitting: Family

## 2020-08-08 MED ORDER — SIMVASTATIN 10 MG PO TABS: ORAL_TABLET | ORAL | 1 refills | Status: DC

## 2020-08-08 NOTE — Telephone Encounter (Signed)
Rx was refilled

## 2020-08-08 NOTE — Telephone Encounter (Signed)
Medication: simvastatin (ZOCOR) 10 MG tablet [[id number]   Has the patient contacted their pharmacy? No. (If no, request that the patient contact the pharmacy for the refill.) (If yes, when and what did the pharmacy advise?)  Preferred Pharmacy (with phone number or street name):  [facility] [place], [place] - [address] AT [facility]  [place] [address]  Phone:  [phone] Fax:  [phone]  DEA #:  [id number]  [place] Reason: --     Agent: Please be advised that RX refills may take up to 3 business days. We ask that you follow-up with your pharmacy.

## 2020-08-29 ENCOUNTER — Ambulatory Visit (HOSPITAL_BASED_OUTPATIENT_CLINIC_OR_DEPARTMENT_OTHER)
Admission: RE | Admit: 2020-08-29 | Discharge: 2020-08-29 | Disposition: A | Discharge status: Home / Self Care | Payer: 59 | Source: Ambulatory Visit | Attending: Family | Admitting: Family

## 2020-08-29 ENCOUNTER — Other Ambulatory Visit: Payer: Self-pay

## 2020-08-29 DIAGNOSIS — I700 Atherosclerosis of aorta: Secondary | ICD-10-CM | POA: Insufficient documentation

## 2020-08-29 DIAGNOSIS — R011 Cardiac murmur, unspecified: Secondary | ICD-10-CM

## 2020-08-29 LAB — ECHOCARDIOGRAM COMPLETE
AR max vel: 1.04 cm2
AV Area VTI: 1.32 cm2
AV Area mean vel: 0.98 cm2
AV Mean grad: 11.5 mmHg
AV Peak grad: 20.2 mmHg
Ao pk vel: 2.25 m/s
Area-P 1/2: 2.37 cm2
S' Lateral: 2.35 cm

## 2020-08-29 NOTE — Progress Notes (Signed)
  Echocardiogram 2D Echocardiogram has been performed.  Katherine Kelly 08/29/2020, 2:57 PM

## 2020-08-30 ENCOUNTER — Telehealth: Payer: Self-pay | Admitting: Family

## 2020-08-30 DIAGNOSIS — I350 Nonrheumatic aortic (valve) stenosis: Secondary | ICD-10-CM | POA: Insufficient documentation

## 2020-08-30 HISTORY — DX: Nonrheumatic aortic (valve) stenosis: I35.0

## 2020-08-30 NOTE — Telephone Encounter (Signed)
Please contact husband and let him know that her echo shows some calcification of her aortic valve which is likely cause for her murmur. I would like to recommend that she see cardiology for consult. I have pended the referral.

## 2020-08-30 NOTE — Telephone Encounter (Signed)
Called but no answer, lvm for call back

## 2020-08-31 NOTE — Telephone Encounter (Signed)
Patient's husband advised of results and referral. He verbalized understanding.

## 2020-09-05 ENCOUNTER — Other Ambulatory Visit: Payer: Self-pay

## 2020-09-05 ENCOUNTER — Encounter: Payer: Self-pay | Admitting: Obstetrics and Gynecology

## 2020-09-05 ENCOUNTER — Ambulatory Visit (INDEPENDENT_AMBULATORY_CARE_PROVIDER_SITE_OTHER): Payer: 59 | Admitting: Obstetrics and Gynecology

## 2020-09-05 ENCOUNTER — Other Ambulatory Visit (HOSPITAL_COMMUNITY)
Admission: RE | Admit: 2020-09-05 | Discharge: 2020-09-05 | Disposition: A | Discharge status: Home / Self Care | Payer: 59 | Source: Ambulatory Visit | Attending: Obstetrics and Gynecology | Admitting: Obstetrics and Gynecology

## 2020-09-05 VITALS — BP 142/92 | HR 98 | Ht 62.0 in | Wt 143.0 lb

## 2020-09-05 DIAGNOSIS — Z01419 Encounter for gynecological examination (general) (routine) without abnormal findings: Secondary | ICD-10-CM | POA: Diagnosis not present

## 2020-09-05 NOTE — Progress Notes (Signed)
Subjective:     Katherine Kelly is a 65 y.o. female P3 with BMI 26 postmenopausal who is here for a comprehensive physical exam. The patient reports no problems. Patient is not sexually active. She denies pelvic pain or abnormal discharge. She denies any episodes of postmenopausal vaginal bleeding. Patient denies urinary incontinence. She admits to occasional constipation  Past Medical History:  Diagnosis Date  . ACE-inhibitor cough   . Diabetes mellitus, type 2 ([place]) 09/2006   no meds  . History of colonic polyps   . Hypertension    Past Surgical History:  Procedure Laterality Date  . BREAST BIOPSY Left    stereo tatic  . CARDIOVASCULAR STRESS TEST  11/08/2006  . [place]  . COLONOSCOPY    . ESOPHAGOGASTRODUODENOSCOPY  04/2006  . POLYPECTOMY     Family History  Problem Relation Age of Onset  . Colon cancer Neg Hx   . Rectal cancer Neg Hx   . Stomach cancer Neg Hx     Social History   Socioeconomic History  . Marital status: Married    Spouse name: Not on file  . Number of children: Not on file  . Years of education: Not on file  . Highest education level: Not on file  Occupational History  . Not on file  Tobacco Use  . Smoking status: Never Smoker  . Smokeless tobacco: Never Used  Substance and Sexual Activity  . Alcohol use: No  . Drug use: No  . Sexual activity: Not on file  Other Topics Concern  . 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 COVID-19 Vaccine (1) Never done  . OPHTHALMOLOGY EXAM  01/12/2015   . COLONOSCOPY (Pts 45-15yrs Insurance coverage will need to be confirmed)  09/09/2017  . PAP SMEAR-Modifier  12/05/2019  . INFLUENZA VACCINE  11/14/2020  . TETANUS/TDAP  12/21/2020  . HEMOGLOBIN A1C  01/17/2021  . FOOT EXAM  07/18/2021  . MAMMOGRAM  05/24/2022  . Hepatitis C Screening  Completed  . HIV Screening  Completed  . HPV VACCINES  Aged Out       Review of Systems Pertinent items noted in HPI and remainder of comprehensive ROS otherwise negative.   Objective:  Blood pressure (!) 142/92, pulse 98, height 5\' 2"  (1.575 m), weight 143 lb (64.9 kg).     GENERAL: Well-developed, well-nourished female in no acute distress.  HEENT: Normocephalic, atraumatic. Sclerae anicteric.  NECK: Supple. Normal thyroid.  LUNGS: Clear to auscultation bilaterally.  HEART: Regular rate and rhythm. BREASTS: Symmetric in size. No palpable masses or lymphadenopathy, skin changes, or nipple drainage. ABDOMEN: Soft, nontender, nondistended. No organomegaly. PELVIC: Normal external female genitalia. Vagina is pink and rugated. Small rectocele present.  Normal discharge. Normal appearing cervix. Uterus is normal in size.  No adnexal mass or tenderness. EXTREMITIES: No cyanosis, clubbing, or edema, 2+ distal pulses.    Assessment:    Healthy female exam.      Plan:  Pap smear collected Patient referred for colonoscopy Patient with normal mammogram 05/2020 Patient will be contacted with abnormal results RTC in 1 year or prn See After Visit Summary for Counseling Recommendations

## 2020-09-07 LAB — CYTOLOGY - PAP
Comment: NEGATIVE
Diagnosis: NEGATIVE
High risk HPV: NEGATIVE

## 2020-10-06 DIAGNOSIS — T464X5A Adverse effect of angiotensin-converting-enzyme inhibitors, initial encounter: Secondary | ICD-10-CM | POA: Insufficient documentation

## 2020-10-06 DIAGNOSIS — R058 Other specified cough: Secondary | ICD-10-CM | POA: Insufficient documentation

## 2020-10-07 ENCOUNTER — Encounter: Payer: Self-pay | Admitting: Cardiology

## 2020-10-07 ENCOUNTER — Other Ambulatory Visit: Payer: Self-pay

## 2020-10-07 ENCOUNTER — Ambulatory Visit (INDEPENDENT_AMBULATORY_CARE_PROVIDER_SITE_OTHER): Payer: 59 | Admitting: Cardiology

## 2020-10-07 VITALS — BP 144/98 | HR 103 | Ht 62.0 in | Wt 143.0 lb

## 2020-10-07 DIAGNOSIS — E785 Hyperlipidemia, unspecified: Secondary | ICD-10-CM

## 2020-10-07 DIAGNOSIS — R809 Proteinuria, unspecified: Secondary | ICD-10-CM | POA: Diagnosis not present

## 2020-10-07 DIAGNOSIS — E1129 Type 2 diabetes mellitus with other diabetic kidney complication: Secondary | ICD-10-CM | POA: Diagnosis not present

## 2020-10-07 DIAGNOSIS — I350 Nonrheumatic aortic (valve) stenosis: Secondary | ICD-10-CM

## 2020-10-07 HISTORY — DX: Hyperlipidemia, unspecified: E78.5

## 2020-10-07 NOTE — Progress Notes (Signed)
Cardiology Consultation:    Date:  10/07/2020   ID:  Katherine Kelly, DOB 06/20/1955, MRN [id number]  PCP:  Debbrah Alar, NP  Cardiologist:  Jenne Campus, MD   Referring MD: Debbrah Alar, NP   Chief Complaint  Patient presents with   Aortic  Valve stenosis     Ref by Lemar Livings    History of Present Illness:    Katherine Kelly is a 65 y.o. female who is being seen today for the evaluation of aortic stenosis at the request of Debbrah Alar, NP.  She is nice [place] lady with past medical history significant for diabetes, essential hypertension, hyperlipidemia.  She was noted to have heart murmur for years.  There is an echocardiogram in the chart from 2017 showing some thickening of the aortic valve but no significant stenosis.  Recently her heart murmur became much louder and eventually she ended up having echocardiogram repeated which showed mild to moderate aortic valve stenosis with calculated aortic valve area of 1.32 cm, mean gradient was 11.5.  She comes here to my office to discuss those findings.  Overall she is doing well.  She denies have any chest pain tightness squeezing pressure burning chest there is no dizziness no passing out.  She is trying to be active and she walks on the regular basis she did not notice any decrease in ability to exercise. She does not smoke, never did She does not have family history of any problems. She came to [place] about 40 years ago. She is not on any special diet  Past Medical History:  Diagnosis Date   ACE-inhibitor cough    ALLERGIC RHINITIS 01/03/2009   Qualifier: Diagnosis of  By: Wynona Luna    Aortic valve stenosis 08/30/2020   Cataract, bilateral    Diabetes mellitus, type 2 ([place]) 09/2006   no meds   Diabetes type 2, controlled ([place]) 11/27/2006   Qualifier: Diagnosis of  By: Loanne Drilling MD, Sean A    Diabetic neuropathy ([place]) 08/31/2013   Eczema 09/14/2014   Essential hypertension  11/27/2006   Qualifier: Diagnosis of  By: Loanne Drilling MD, Sean A    History of colonic polyps    Hypertension    RHINOSINUSITIS, CHRONIC 07/07/2009   Qualifier: Diagnosis of  By: Wynona Luna     Past Surgical History:  Procedure Laterality Date   BREAST BIOPSY Left    stereo tatic   CARDIOVASCULAR STRESS TEST  11/08/2006   CESAREAN SECTION  1980 & 1982   COLONOSCOPY     ESOPHAGOGASTRODUODENOSCOPY  04/2006   POLYPECTOMY      Current Medications: Current Meds  Medication Sig   betamethasone dipropionate (DIPROLENE) 0.05 % cream Apply 1 application topically daily as needed. (Patient taking differently: Apply 1 application topically daily as needed (rash).)   cholecalciferol (VITAMIN D) 1000 UNITS tablet Take 1,000 Units by mouth daily. Reported on 10/25/2015   Clobetasol Prop Emollient Base 0.05 % emollient cream Apply 1 application topically 2 (two) times daily as needed. (Patient taking differently: Apply 1 application topically 2 (two) times daily as needed (rash).)   fenofibrate 160 MG tablet Take 1 tablet (160 mg total) by mouth daily.   losartan (COZAAR) 25 MG tablet TAKE 1 TABLET(25 MG) BY MOUTH DAILY (Patient taking differently: Take 25 mg by mouth daily.)   metFORMIN (GLUCOPHAGE) 500 MG tablet Take 1 tablet (500 mg total) by mouth 2 (two) times daily with a meal.   simvastatin (ZOCOR)  10 MG tablet TAKE 1 TABLET(10 MG) BY MOUTH AT BEDTIME (Patient taking differently: Take 10 mg by mouth daily at 6 PM. TAKE 1 TABLET(10 MG) BY MOUTH AT BEDTIME)     Allergies:   Ace inhibitors   Social History   Socioeconomic History   Marital status: Married    Spouse name: Not on file   Number of children: Not on file   Years of education: Not on file   Highest education level: Not on file  Occupational History   Not on file  Tobacco Use   Smoking status: Never   Smokeless tobacco: Never  Substance and Sexual Activity   Alcohol use: No   Drug use: No   Sexual activity: Not on file   Other Topics Concern   Not on file  Social History Narrative   Lives with husband and daughter   Freight forwarder at [employer]   No pets   Did not complete high school   Enjoys travel and shopping   Social Determinants of Health   Financial Resource Strain: Not on file  Food Insecurity: Not on file  Transportation Needs: Not on file  Physical Activity: Not on file  Stress: Not on file  Social Connections: Not on file     Family History: The patient's family history is negative for Colon cancer, Rectal cancer, and Stomach cancer. ROS:   Please see the history of present illness.    All 14 point review of systems negative except as described per history of present illness.  EKGs/Labs/Other Studies Reviewed:    The following studies were reviewed today: Echocardiogram done on 08/29/2020 showed:   1. Left ventricular ejection fraction, by estimation, is 65 to 70%. The  left ventricle has normal function. The left ventricle has no regional  wall motion abnormalities. There is mild concentric left ventricular  hypertrophy. Left ventricular diastolic  parameters are consistent with Grade I diastolic dysfunction (impaired  relaxation).   2. Right ventricular systolic function is normal. The right ventricular  size is normal. There is normal pulmonary artery systolic pressure.   3. There is trivial circumferential pericardial effusion. There is no  evidence of cardiac tamponade.   4. The mitral valve is normal in structure. No evidence of mitral valve  regurgitation. No evidence of mitral stenosis.   5. The aortic valve is normal in structure. Aortic valve regurgitation is  mild. Mild to moderate aortic valve stenosis. Aortic valve area, by VTI  measures 1.32 cm. Aortic valve mean gradient measures 11.5 mmHg. Aortic  valve Vmax measures 2.24 m/s.   6. Aortic dilatation noted. There is dilatation of the ascending aorta,  measuring 38 mm.   7. The inferior vena cava is normal in  size with greater than 50%  respiratory variability, suggesting right atrial pressure of 3 mmHg.    EKG:  EKG is  ordered today.  The ekg ordered today demonstrates EKG shows sinus tachycardia rate 102, normal QS complex duration morphology nonspecific ST segment changes  Recent Labs: 07/18/2020: BUN 14; Creatinine, Ser 0.84; Potassium 4.2; Sodium 138  Recent Lipid Panel    Component Value Date/Time   CHOL 118 01/18/2020 1213   TRIG 85 01/18/2020 1213   HDL 53 01/18/2020 1213   CHOLHDL 2.2 01/18/2020 1213   VLDL 29.8 03/31/2019 1347   LDLCALC 48 01/18/2020 1213   LDLDIRECT 64.9 12/17/2006 1127    Physical Exam:    VS:  BP (!) 144/98 (BP Location: Right Arm, Patient Position: Sitting)  Pulse (!) 103   Ht 5\' 2"  (1.575 m)   Wt 143 lb (64.9 kg)   SpO2 95%   BMI 26.16 kg/m     Wt Readings from Last 3 Encounters:  10/07/20 143 lb (64.9 kg)  09/05/20 143 lb (64.9 kg)  07/18/20 138 lb (62.6 kg)     GEN:  Well nourished, well developed in no acute distress HEENT: Normal NECK: No JVD; No carotid bruits LYMPHATICS: No lymphadenopathy CARDIAC: RRR, systolic ejection murmur only 1-2 over 6 best heard right upper portion of the sternum tones are distant,, no rubs, no gallops RESPIRATORY:  Clear to auscultation without rales, wheezing or rhonchi  ABDOMEN: Soft, non-tender, non-distended MUSCULOSKELETAL:  No edema; No deformity  SKIN: Warm and dry NEUROLOGIC:  Alert and oriented x 3 PSYCHIATRIC:  Normal affect   ASSESSMENT:    1. Nonrheumatic aortic valve stenosis   2. Controlled type 2 diabetes mellitus with microalbuminuria, without long-term current use of insulin ([place])   3. Dyslipidemia    PLAN:    In order of problems listed above:  Nonrheumatic aortic valve stenosis, only mild to moderate I did spend very little time explaining to them what his aortic stenosis is not was the implication of this problem.  I told them that at this stage there is nothing that we need to do  except for overall making sure that she got her diabetes well controlled blood pressure well controlled as well as cholesterol well controlled.  I also told him there is no medication I can give them to slow down progression of the problem.  I will see her back in my office in about 6 months and then will repeat echocardiogram in about 12 months.  I told him that eventually she will required aortic valve replacement but I simply do not know at this stage when it will happen.  I told her there is absolutely no limitation in terms of doing exercises because of this aortic valve problem Type 2 diabetes she is on appropriate medications, her hemoglobin A1c I have from [facility] is 7.0 this is from April of this year we will continue present management. Dyslipidemia: I do have her LDL of 48 HDL 53 and she is taking simvastatin she is also on fenofibrate secondary to high triglycerides.  We will continue that management. Essential hypertension mildly elevated but this is first visit to my office therefore will not change any of her medications.   Medication Adjustments/Labs and Tests Ordered: Current medicines are reviewed at length with the patient today.  Concerns regarding medicines are outlined above.  No orders of the defined types were placed in this encounter.  No orders of the defined types were placed in this encounter.   Signed, Park Liter, MD, [facility]. 10/07/2020 3:14 PM    [facility]

## 2020-10-07 NOTE — Patient Instructions (Signed)

## 2020-10-18 ENCOUNTER — Other Ambulatory Visit: Payer: Self-pay

## 2020-10-18 ENCOUNTER — Telehealth: Payer: Self-pay | Admitting: Family

## 2020-10-18 ENCOUNTER — Ambulatory Visit (INDEPENDENT_AMBULATORY_CARE_PROVIDER_SITE_OTHER): Payer: 59 | Admitting: Family

## 2020-10-18 VITALS — BP 151/92 | HR 87 | Temp 98.1°F | Resp 16 | Ht 62.0 in | Wt 143.2 lb

## 2020-10-18 DIAGNOSIS — I10 Essential (primary) hypertension: Secondary | ICD-10-CM

## 2020-10-18 DIAGNOSIS — R809 Proteinuria, unspecified: Secondary | ICD-10-CM

## 2020-10-18 DIAGNOSIS — E1129 Type 2 diabetes mellitus with other diabetic kidney complication: Secondary | ICD-10-CM

## 2020-10-18 DIAGNOSIS — E785 Hyperlipidemia, unspecified: Secondary | ICD-10-CM | POA: Diagnosis not present

## 2020-10-18 DIAGNOSIS — R87610 Atypical squamous cells of undetermined significance on cytologic smear of cervix (ASC-US): Secondary | ICD-10-CM | POA: Diagnosis not present

## 2020-10-18 MED ORDER — LOSARTAN POTASSIUM 50 MG PO TABS: 50.0000 mg | ORAL_TABLET | Freq: Every day | ORAL | 0 refills | Status: DC

## 2020-10-18 NOTE — Assessment & Plan Note (Signed)
Pap updated by GYN in May (Normal).

## 2020-10-18 NOTE — Assessment & Plan Note (Signed)
Stable. Refer to ophthalmology for DM eye exam.  Plan A1C next visit.

## 2020-10-18 NOTE — Assessment & Plan Note (Signed)
Uncontrolled.  Will increase losartan from 25mg  to 50mg . Check bmet when she returns for follow up.

## 2020-10-18 NOTE — Progress Notes (Signed)
Subjective:     Patient ID: Katherine Kelly, female    DOB: 1955/07/25, 65 y.o.   MRN: [id number]  Chief Complaint  Patient presents with   Hypertension    Here for follow up   Diabetes    Here for follow up    HPI Patient is in today for follow up. She is accompanied today by her daughter who assists with translation.   HTN- maintained on losartan 50mg .   BP Readings from Last 3 Encounters:  10/18/20 (!) 151/92  10/07/20 (!) 144/98  09/05/20 (!) 142/92   DM2- continues metformin 500mg  bid.  Lab Results  Component Value Date   HGBA1C 7.0 (H) 07/18/2020   HGBA1C 6.5 (H) 01/18/2020   HGBA1C 7.7 (H) 03/31/2019   Lab Results  Component Value Date   MICROALBUR 2.3 (H) 12/14/2014   LDLCALC 48 01/18/2020   CREATININE 0.84 07/18/2020   Hyperlipidemia- maintained on simvastatin 10mg  once daily.  Lab Results  Component Value Date   CHOL 118 01/18/2020   HDL 53 01/18/2020   LDLCALC 48 01/18/2020   LDLDIRECT 64.9 12/17/2006   TRIG 85 01/18/2020   CHOLHDL 2.2 01/18/2020      Health Maintenance Due  Topic Date Due   COVID-19 Vaccine (1) Never done   Zoster Vaccines- Shingrix (1 of 2) Never done   OPHTHALMOLOGY EXAM  01/12/2015   COLONOSCOPY (Pts 45-43yrs Insurance coverage will need to be confirmed)  09/09/2017    Past Medical History:  Diagnosis Date   ACE-inhibitor cough    ALLERGIC RHINITIS 01/03/2009   Qualifier: Diagnosis of  By: Wynona Luna    Aortic valve stenosis 08/30/2020   Cataract, bilateral    Diabetes mellitus, type 2 ([place]) 09/2006   no meds   Diabetes type 2, controlled ([place]) 11/27/2006   Qualifier: Diagnosis of  By: Katherine Kelly    Diabetic neuropathy ([place]) 08/31/2013   Eczema 09/14/2014   Essential hypertension 11/27/2006   Qualifier: Diagnosis of  By: Katherine Kelly    History of colonic polyps    Hypertension    RHINOSINUSITIS, CHRONIC 07/07/2009   Qualifier: Diagnosis of  By: Wynona Luna     Past Surgical  History:  Procedure Laterality Date   BREAST BIOPSY Left    stereo tatic   CARDIOVASCULAR STRESS TEST  11/08/2006   CESAREAN SECTION  1980 & 1982   COLONOSCOPY     ESOPHAGOGASTRODUODENOSCOPY  04/2006   POLYPECTOMY      Family History  Problem Relation Age of Onset   Colon cancer Neg Hx    Rectal cancer Neg Hx    Stomach cancer Neg Hx     Social History   Socioeconomic History   Marital status: Married    Spouse name: Not on file   Number of children: Not on file   Years of education: Not on file   Highest education level: Not on file  Occupational History   Not on file  Tobacco Use   Smoking status: Never   Smokeless tobacco: Never  Substance and Sexual Activity   Alcohol use: No   Drug use: No   Sexual activity: Not on file  Other Topics Concern   Not on file  Social History Narrative   Lives with husband and daughter   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 (Patient taking differently: Apply 1 application topically daily as needed (rash).) 30 g 1   cholecalciferol (VITAMIN D) 1000 UNITS tablet Take 1,000 Units by mouth daily. Reported on 10/25/2015     Clobetasol Prop Emollient Base 0.05 % emollient cream Apply 1 application topically 2 (two) times daily as needed. (Patient taking differently: Apply 1 application topically 2 (two) times daily as needed (rash).) 30 g 1   fenofibrate 160 MG tablet Take 1 tablet (160 mg total) by mouth daily. 90 tablet 1   metFORMIN  (GLUCOPHAGE) 500 MG tablet Take 1 tablet (500 mg total) by mouth 2 (two) times daily with Kelly meal. 180 tablet 1   simvastatin (ZOCOR) 10 MG tablet TAKE 1 TABLET(10 MG) BY MOUTH AT BEDTIME (Patient taking differently: Take 10 mg by mouth daily at 6 PM. TAKE 1 TABLET(10 MG) BY MOUTH AT BEDTIME) 90 tablet 1   losartan (COZAAR) 25 MG tablet TAKE 1 TABLET(25 MG) BY MOUTH DAILY (Patient taking differently: Take 25 mg by mouth daily.) 90 tablet 1   No facility-administered medications prior to visit.    Allergies  Allergen Reactions   Ace Inhibitors     weakness    ROS See HPI    Objective:    Physical Exam Constitutional:      Appearance: She is well-developed.  Cardiovascular:     Rate and Rhythm: Normal rate and regular rhythm.     Heart sounds: Normal heart sounds. No murmur heard. Pulmonary:     Effort: Pulmonary effort is normal. No respiratory distress.     Breath sounds: Normal breath sounds. No wheezing.  Psychiatric:        Behavior: Behavior normal.        Thought Content: Thought content normal.        Judgment: Judgment normal.    BP (!) 151/92 (BP Location: Right Arm, Patient Position: Sitting, Cuff Size: Small)   Pulse 87   Temp 98.1 F (36.7 C) (Oral)   Resp 16   Ht 5\' 2"  (1.575 m)   Wt 143 lb 3.2 oz (65 kg)   SpO2 99%   BMI 26.19 kg/m  Wt Readings from Last 3 Encounters:  10/18/20 143 lb 3.2 oz (65 kg)  10/07/20 143 lb (64.9 kg)  09/05/20 143 lb (64.9 kg)       Assessment & Plan:   Problem List Items Addressed This Visit       Unprioritized   Essential hypertension    Uncontrolled.  Will increase losartan from 25mg  to 50mg . Check bmet when she returns for follow up.        Relevant Medications   losartan (COZAAR) 50 MG tablet   Dyslipidemia    Lipids at goal. Continue simvastatin.  Discussed Cologuard and she is agreeable. Will initiate.        Diabetes type 2, controlled ([place]) - Primary    Stable. Refer to ophthalmology for DM eye exam.   Plan A1C next visit.        Relevant Medications   losartan (COZAAR) 50 MG tablet   Other Relevant  Orders   Ambulatory referral to Ophthalmology   ASCUS of cervix with negative high risk HPV    Pap updated by GYN in May (Normal).        I have discontinued Katherine Kelly's losartan. I am also having her start on losartan. Additionally, I am having her maintain her cholecalciferol, Clobetasol Prop Emollient Base, betamethasone dipropionate, fenofibrate, metFORMIN, and simvastatin.  Meds ordered this encounter  Medications   losartan (COZAAR) 50 MG tablet    Sig: Take 1 tablet (50 mg total) by mouth daily.    Dispense:  90 tablet    Refill:  0    Order Specific Question:   Supervising Provider    Answer:   Penni Homans Kelly [[id number]]

## 2020-10-18 NOTE — Telephone Encounter (Signed)
Can you please initiate Cologuard?

## 2020-10-18 NOTE — Assessment & Plan Note (Signed)
Lipids at goal. Continue simvastatin.  Discussed Cologuard and she is agreeable. Will initiate.

## 2020-10-18 NOTE — Telephone Encounter (Signed)
Cologuard initiated for patient

## 2020-10-18 NOTE — Patient Instructions (Addendum)
Please increase losartan to 50mg  once daily for blood pressure.

## 2020-10-19 ENCOUNTER — Other Ambulatory Visit: Payer: Self-pay | Admitting: Family

## 2020-11-01 ENCOUNTER — Ambulatory Visit (INDEPENDENT_AMBULATORY_CARE_PROVIDER_SITE_OTHER): Payer: 59 | Admitting: Family

## 2020-11-01 ENCOUNTER — Other Ambulatory Visit: Payer: Self-pay

## 2020-11-01 ENCOUNTER — Encounter: Payer: Self-pay | Admitting: Family

## 2020-11-01 VITALS — BP 138/70 | HR 70 | Resp 18 | Ht 62.0 in | Wt 142.0 lb

## 2020-11-01 DIAGNOSIS — I10 Essential (primary) hypertension: Secondary | ICD-10-CM | POA: Diagnosis not present

## 2020-11-01 DIAGNOSIS — Z23 Encounter for immunization: Secondary | ICD-10-CM | POA: Diagnosis not present

## 2020-11-01 DIAGNOSIS — E1129 Type 2 diabetes mellitus with other diabetic kidney complication: Secondary | ICD-10-CM

## 2020-11-01 DIAGNOSIS — E785 Hyperlipidemia, unspecified: Secondary | ICD-10-CM | POA: Diagnosis not present

## 2020-11-01 DIAGNOSIS — Z28310 Unvaccinated for covid-19: Secondary | ICD-10-CM

## 2020-11-01 DIAGNOSIS — Z2821 Immunization not carried out because of patient refusal: Secondary | ICD-10-CM

## 2020-11-01 DIAGNOSIS — R809 Proteinuria, unspecified: Secondary | ICD-10-CM

## 2020-11-01 HISTORY — DX: Immunization not carried out because of patient refusal: Z28.21

## 2020-11-01 HISTORY — DX: Unvaccinated for covid-19: Z28.310

## 2020-11-01 NOTE — Assessment & Plan Note (Signed)
LDL at goal last time we checked. Continue simvastatin and fenofibrate 160mg .

## 2020-11-01 NOTE — Assessment & Plan Note (Signed)
Clinically stable on metformin 500mg  bid. Continue same, obtain follow up A1c.

## 2020-11-01 NOTE — Assessment & Plan Note (Signed)
BP Readings from Last 3 Encounters:  11/01/20 138/70  10/18/20 (!) 151/92  10/07/20 (!) 144/98   Repeat bp was improved today.  Continue losartan 50mg  once daily.

## 2020-11-01 NOTE — Progress Notes (Signed)
i  Subjective:     Patient ID: Katherine Kelly, female    DOB: [birth year], 65 y.o.   MRN: [id number]  Chief Complaint  Patient presents with   Follow-up    HPI Patient is in today for follow up.  DM2- maintained on Metformin 500mg  bid.  Lab Results  Component Value Date   HGBA1C 7.0 (H) 07/18/2020   HGBA1C 6.5 (H) 01/18/2020   HGBA1C 7.7 (H) 03/31/2019   Lab Results  Component Value Date   MICROALBUR 2.3 (H) 12/14/2014   LDLCALC 48 01/18/2020   CREATININE 0.84 07/18/2020   ASCUS Pap- following with GYN  Hyperlipidemia-  Lab Results  Component Value Date   CHOL 118 01/18/2020   HDL 53 01/18/2020   LDLCALC 48 01/18/2020   LDLDIRECT 64.9 12/17/2006   TRIG 85 01/18/2020   CHOLHDL 2.2 01/18/2020   Maintained on simvastatin 10mg .   HTN- Reports SBP 120 this AM. Continues losartan 50mg  once daily.  BP Readings from Last 3 Encounters:  11/01/20 138/70  10/18/20 (!) 151/92  10/07/20 (!) 144/98      Health Maintenance Due  Topic Date Due   OPHTHALMOLOGY EXAM  01/12/2015   COLONOSCOPY (Pts 45-28yrs Insurance coverage will need to be confirmed)  09/09/2017    Past Medical History:  Diagnosis Date   ACE-inhibitor cough    ALLERGIC RHINITIS 01/03/2009   Qualifier: Diagnosis of  By: Wynona Luna    Aortic valve stenosis 08/30/2020   Cataract, bilateral    COVID-19 vaccine series declined 11/01/2020   Diabetes mellitus, type 2 ([place]) 09/2006   no meds   Diabetes type 2, controlled ([place]) 11/27/2006   Qualifier: Diagnosis of  By: Loanne Drilling MD, Sean A    Diabetic neuropathy ([place]) 08/31/2013   Eczema 09/14/2014   Essential hypertension 11/27/2006   Qualifier: Diagnosis of  By: Loanne Drilling MD, Sean A    History of colonic polyps    Hypertension    RHINOSINUSITIS, CHRONIC 07/07/2009   Qualifier: Diagnosis of  By: Wynona Luna     Past Surgical History:  Procedure Laterality Date   BREAST BIOPSY Left    stereo tatic   CARDIOVASCULAR STRESS TEST  11/08/2006    CESAREAN SECTION  1980 & 1982   COLONOSCOPY     ESOPHAGOGASTRODUODENOSCOPY  04/2006   POLYPECTOMY      Family History  Problem Relation Age of Onset   Colon cancer Neg Hx    Rectal cancer Neg Hx    Stomach cancer Neg Hx     Social History   Socioeconomic History   Marital status: Married    Spouse name: Not on file   Number of children: Not on file   Years of education: Not on file   Highest education level: Not on file  Occupational History   Not on file  Tobacco Use   Smoking status: Never   Smokeless tobacco: Never  Substance and Sexual Activity   Alcohol use: No   Drug use: No   Sexual activity: Not on file  Other Topics Concern   Not on file  Social History Narrative   Lives with husband and daughter   Freight forwarder at [employer]   No pets   Did not complete high school   Enjoys travel and shopping   Social Determinants of Health   Financial Resource Strain: Not on file  Food Insecurity: Not on file  Transportation Needs: Not on file  Physical Activity: Not on file  Stress: Not on file  Social Connections: Not on file  Intimate Partner Violence: Not on file    Outpatient Medications Prior to Visit  Medication Sig Dispense Refill   betamethasone dipropionate (DIPROLENE) 0.05 % cream Apply 1 application topically daily as needed. (Patient taking differently: Apply 1 application topically daily as needed (rash).) 30 g 1   cholecalciferol (VITAMIN D) 1000 UNITS tablet Take 1,000 Units by mouth daily. Reported on 10/25/2015     Clobetasol Prop Emollient Base 0.05 % emollient cream Apply 1 application topically 2 (two) times daily as needed. (Patient taking differently: Apply 1 application topically 2 (two) times daily as needed (rash).) 30 g 1   fenofibrate 160 MG tablet TAKE 1 TABLET(160 MG) BY MOUTH DAILY 90 tablet 1   losartan (COZAAR) 50 MG tablet Take 1 tablet (50 mg total) by mouth daily. 90 tablet 0   metFORMIN (GLUCOPHAGE) 500 MG tablet Take 1 tablet (500  mg total) by mouth 2 (two) times daily with a meal. 180 tablet 1   simvastatin (ZOCOR) 10 MG tablet TAKE 1 TABLET(10 MG) BY MOUTH AT BEDTIME (Patient taking differently: Take 10 mg by mouth daily at 6 PM. TAKE 1 TABLET(10 MG) BY MOUTH AT BEDTIME) 90 tablet 1   No facility-administered medications prior to visit.    Allergies  Allergen Reactions   Ace Inhibitors     weakness    ROS See HPI    Objective:    Physical Exam Constitutional:      General: She is not in acute distress.    Appearance: Normal appearance. She is well-developed.  HENT:     Head: Normocephalic and atraumatic.     Right Ear: External ear normal.     Left Ear: External ear normal.  Eyes:     General: No scleral icterus. Neck:     Thyroid: No thyromegaly.  Cardiovascular:     Rate and Rhythm: Normal rate and regular rhythm.     Heart sounds: Normal heart sounds. No murmur heard. Pulmonary:     Effort: Pulmonary effort is normal. No respiratory distress.     Breath sounds: Normal breath sounds. No wheezing.  Musculoskeletal:     Cervical back: Neck supple.  Skin:    General: Skin is warm and dry.  Neurological:     Mental Status: She is alert and oriented to person, place, and time.  Psychiatric:        Mood and Affect: Mood normal.        Behavior: Behavior normal.        Thought Content: Thought content normal.        Judgment: Judgment normal.    BP 138/70   Pulse 70   Resp 18   Ht 5\' 2"  (1.575 m)   Wt 142 lb (64.4 kg)   SpO2 95%   BMI 25.97 kg/m  Wt Readings from Last 3 Encounters:  11/01/20 142 lb (64.4 kg)  10/18/20 143 lb 3.2 oz (65 kg)  10/07/20 143 lb (64.9 kg)       Assessment & Plan:   Problem List Items Addressed This Visit       Unprioritized   Essential hypertension    BP Readings from Last 3 Encounters:  11/01/20 138/70  10/18/20 (!) 151/92  10/07/20 (!) 144/98  Repeat bp was improved today.  Continue losartan 50mg  once daily.       Relevant Orders   Basic  metabolic panel   Dyslipidemia    LDL  at goal last time we checked. Continue simvastatin and fenofibrate 160mg .        Relevant Orders   Lipid panel   Diabetes type 2, controlled ([place]) - Primary    Clinically stable on metformin 500mg  bid. Continue same, obtain follow up A1c.        Relevant Orders   Hemoglobin A1c   COVID-19 vaccine series declined   Other Visit Diagnoses     Need for shingles vaccine       Relevant Orders   Varicella-zoster vaccine IM (Shingrix) (Completed)   COVID-19 vaccine dose declined           I am having Katherine Kelly maintain her cholecalciferol, Clobetasol Prop Emollient Base, betamethasone dipropionate, metFORMIN, simvastatin, losartan, and fenofibrate.  No orders of the defined types were placed in this encounter.

## 2020-11-07 ENCOUNTER — Telehealth: Payer: Self-pay | Admitting: Family

## 2020-11-07 NOTE — Telephone Encounter (Signed)
Please call pt's husband and arrange a lab appointment for patient. Looks like she forgot to go to the lab after her appointment.

## 2020-11-08 NOTE — Telephone Encounter (Signed)
Lvm for patient's husband to call back

## 2020-11-11 NOTE — Addendum Note (Signed)
Addended by: Kem Boroughs D on: 11/11/2020 10:18 AM   Modules accepted: Orders

## 2020-11-11 NOTE — Telephone Encounter (Signed)
Spoke with husband and he was driving and gave his daughter Lucky Cowboy number [phone] to call.  Spoke with Almyra Free and appointment made for 11/14/20.

## 2020-11-14 ENCOUNTER — Other Ambulatory Visit: Payer: Self-pay

## 2020-11-14 ENCOUNTER — Other Ambulatory Visit (INDEPENDENT_AMBULATORY_CARE_PROVIDER_SITE_OTHER): Payer: 59

## 2020-11-14 DIAGNOSIS — I10 Essential (primary) hypertension: Secondary | ICD-10-CM

## 2020-11-14 DIAGNOSIS — E785 Hyperlipidemia, unspecified: Secondary | ICD-10-CM

## 2020-11-14 DIAGNOSIS — E1129 Type 2 diabetes mellitus with other diabetic kidney complication: Secondary | ICD-10-CM | POA: Diagnosis not present

## 2020-11-14 DIAGNOSIS — R809 Proteinuria, unspecified: Secondary | ICD-10-CM

## 2020-11-15 LAB — LIPID PANEL
Cholesterol: 116 mg/dL (ref 0–200)
HDL: 48.3 mg/dL (ref >39.00)
LDL Cholesterol: 33 mg/dL (ref 0–99)
NonHDL: 67.97
Total CHOL/HDL Ratio: 2
Triglycerides: 174 mg/dL — ABNORMAL HIGH (ref 0.0–149.0)
VLDL: 34.8 mg/dL (ref 0.0–40.0)

## 2020-11-15 LAB — BASIC METABOLIC PANEL
BUN: 20 mg/dL (ref 6–23)
CO2: 27 mEq/L (ref 19–32)
Calcium: 10.5 mg/dL (ref 8.4–10.5)
Chloride: 101 mEq/L (ref 96–112)
Creatinine, Ser: 0.86 mg/dL (ref 0.40–1.20)
GFR: 71.3 mL/min (ref >60.00)
Glucose, Bld: 144 mg/dL — ABNORMAL HIGH (ref 70–99)
Potassium: 4.4 mEq/L (ref 3.5–5.1)
Sodium: 139 mEq/L (ref 135–145)

## 2020-11-15 LAB — HEMOGLOBIN A1C: Hgb A1c MFr Bld: 7.2 % — ABNORMAL HIGH (ref 4.6–6.5)

## 2021-01-06 ENCOUNTER — Other Ambulatory Visit: Payer: Self-pay | Admitting: Family

## 2021-01-06 MED ORDER — LOSARTAN POTASSIUM 50 MG PO TABS: 50.0000 mg | ORAL_TABLET | Freq: Every day | ORAL | 0 refills | Status: DC

## 2021-01-27 ENCOUNTER — Other Ambulatory Visit: Payer: Self-pay | Admitting: Family

## 2021-02-27 ENCOUNTER — Other Ambulatory Visit: Payer: Self-pay | Admitting: Family

## 2021-03-20 ENCOUNTER — Other Ambulatory Visit: Payer: Self-pay | Admitting: Family

## 2021-03-25 ENCOUNTER — Other Ambulatory Visit: Payer: Self-pay | Admitting: Family

## 2021-03-25 NOTE — Telephone Encounter (Signed)
Patient is due for office visit.

## 2021-03-29 NOTE — Telephone Encounter (Signed)
Called and spoke to husband, he will have their daughter call  back and schedule an appointment.

## 2021-04-04 ENCOUNTER — Ambulatory Visit (INDEPENDENT_AMBULATORY_CARE_PROVIDER_SITE_OTHER): Payer: 59 | Admitting: Cardiology

## 2021-04-04 ENCOUNTER — Other Ambulatory Visit: Payer: Self-pay

## 2021-04-04 ENCOUNTER — Encounter: Payer: Self-pay | Admitting: Cardiology

## 2021-04-04 VITALS — BP 146/88 | HR 88 | Resp 96 | Ht 62.0 in | Wt 144.0 lb

## 2021-04-04 DIAGNOSIS — R809 Proteinuria, unspecified: Secondary | ICD-10-CM

## 2021-04-04 DIAGNOSIS — I10 Essential (primary) hypertension: Secondary | ICD-10-CM | POA: Diagnosis not present

## 2021-04-04 DIAGNOSIS — E1129 Type 2 diabetes mellitus with other diabetic kidney complication: Secondary | ICD-10-CM

## 2021-04-04 DIAGNOSIS — I350 Nonrheumatic aortic (valve) stenosis: Secondary | ICD-10-CM | POA: Diagnosis not present

## 2021-04-04 NOTE — Patient Instructions (Signed)

## 2021-04-04 NOTE — Progress Notes (Signed)
Cardiology Office Note:    Date:  04/04/2021   ID:  KAMORAH NEVILS, DOB 1955/04/19, MRN [id number]  PCP:  Debbrah Alar, NP  Cardiologist:  Jenne Campus, MD    Referring MD: Debbrah Alar, NP   Chief Complaint  Patient presents with   Follow-up  I am doing fine  History of Present Illness:    Katherine Kelly is a 65 y.o. female with past medical history significant for aortic stenosis which is mild to moderate with mean gradient of 11.5 calculated aortic valve area of 1.32 cm.  She also has mildly dilated aortic root 38 mm, essential hypertension, dyslipidemia, diabetes. She comes today to my office for follow-up overall she is doing very well.  There is no chest pain tightness squeezing pressure burning chest there is no dizziness no passing out.  She is trying to exercise for 1 hour every single day and doing well.  Past Medical History:  Diagnosis Date   ACE-inhibitor cough    ALLERGIC RHINITIS 01/03/2009   Qualifier: Diagnosis of  By: Wynona Luna    Aortic valve stenosis 08/30/2020   Cataract, bilateral    COVID-19 vaccine series declined 11/01/2020   Diabetes mellitus, type 2 ([place]) 09/2006   no meds   Diabetes type 2, controlled ([place]) 11/27/2006   Qualifier: Diagnosis of  By: Loanne Drilling MD, Sean A    Diabetic neuropathy ([place]) 08/31/2013   Eczema 09/14/2014   Essential hypertension 11/27/2006   Qualifier: Diagnosis of  By: Loanne Drilling MD, Sean A    History of colonic polyps    Hypertension    RHINOSINUSITIS, CHRONIC 07/07/2009   Qualifier: Diagnosis of  By: Wynona Luna     Past Surgical History:  Procedure Laterality Date   BREAST BIOPSY Left    stereo tatic   CARDIOVASCULAR STRESS TEST  11/08/2006   CESAREAN SECTION  1980 & 1982   COLONOSCOPY     ESOPHAGOGASTRODUODENOSCOPY  04/2006   POLYPECTOMY      Current Medications: Current Meds  Medication Sig   betamethasone dipropionate (DIPROLENE) 0.05 % cream Apply 1 application topically daily as  needed.   cholecalciferol (VITAMIN D) 1000 UNITS tablet Take 1,000 Units by mouth daily. Reported on 10/25/2015   Clobetasol Prop Emollient Base 0.05 % emollient cream Apply 1 application topically 2 (two) times daily as needed.   fenofibrate 160 MG tablet Take 160 mg by mouth daily.   losartan (COZAAR) 50 MG tablet Take 50 mg by mouth daily.   metFORMIN (GLUCOPHAGE) 500 MG tablet Take 500 mg by mouth 2 (two) times daily with a meal.   simvastatin (ZOCOR) 10 MG tablet Take 10 mg by mouth daily.     Allergies:   Ace inhibitors   Social History   Socioeconomic History   Marital status: Married    Spouse name: Not on file   Number of children: Not on file   Years of education: Not on file   Highest education level: Not on file  Occupational History   Not on file  Tobacco Use   Smoking status: Never   Smokeless tobacco: Never  Substance and Sexual Activity   Alcohol use: No   Drug use: No   Sexual activity: Not on file  Other Topics Concern   Not on file  Social History Narrative   Lives with husband and daughter   Freight forwarder at [employer]   No pets   Did not complete high school   Enjoys travel  and shopping   Social Determinants of Health   Financial Resource Strain: Not on file  Food Insecurity: Not on file  Transportation Needs: Not on file  Physical Activity: Not on file  Stress: Not on file  Social Connections: Not on file     Family History: The patient's family history is negative for Colon cancer, Rectal cancer, and Stomach cancer. ROS:   Please see the history of present illness.    All 14 point review of systems negative except as described per history of present illness  EKGs/Labs/Other Studies Reviewed:      Recent Labs: 11/14/2020: BUN 20; Creatinine, Ser 0.86; Potassium 4.4; Sodium 139  Recent Lipid Panel    Component Value Date/Time   CHOL 116 11/14/2020 1409   TRIG 174.0 (H) 11/14/2020 1409   HDL 48.30 11/14/2020 1409   CHOLHDL 2 11/14/2020  1409   VLDL 34.8 11/14/2020 1409   LDLCALC 33 11/14/2020 1409   LDLCALC 48 01/18/2020 1213   LDLDIRECT 64.9 12/17/2006 1127    Physical Exam:    VS:  BP (!) 146/88 (BP Location: Right Arm, Patient Position: Sitting)    Pulse 88    Resp (!) 96    Ht 5\' 2"  (1.575 m)    Wt 144 lb (65.3 kg)    BMI 26.34 kg/m     Wt Readings from Last 3 Encounters:  04/04/21 144 lb (65.3 kg)  11/01/20 142 lb (64.4 kg)  10/18/20 143 lb 3.2 oz (65 kg)     GEN:  Well nourished, well developed in no acute distress HEENT: Normal NECK: No JVD; No carotid bruits LYMPHATICS: No lymphadenopathy CARDIAC: RRR, systolic ejection murmur grade 2/6 best heard right upper portion of the sternum, no rubs, no gallops RESPIRATORY:  Clear to auscultation without rales, wheezing or rhonchi  ABDOMEN: Soft, non-tender, non-distended MUSCULOSKELETAL:  No edema; No deformity  SKIN: Warm and dry LOWER EXTREMITIES: no swelling NEUROLOGIC:  Alert and oriented x 3 PSYCHIATRIC:  Normal affect   ASSESSMENT:    1. Nonrheumatic aortic valve stenosis   2. Essential hypertension   3. Controlled type 2 diabetes mellitus with microalbuminuria, without long-term current use of insulin (HCC)    PLAN:    In order of problems listed above:  Aortic stenosis which is mild to moderate.  We will repeat echocardiogram in the summer.  She does have any signs or symptoms of worsening develop.  We will continue present management. Essential hypertension elevated today blood pressure but she did not blame it on [place] food that she had yesterday.  Currently at home is good. Type 2 diabetes last hemoglobin A1c 7.2 this is from 11/14/2020.  She required better control her diabetes. Dyslipidemia she is on small dose of statin.  Her LDL 33 HDL 48 continue present management   Medication Adjustments/Labs and Tests Ordered: Current medicines are reviewed at length with the patient today.  Concerns regarding medicines are outlined above.  No  orders of the defined types were placed in this encounter.  Medication changes: No orders of the defined types were placed in this encounter.   Signed, Park Liter, MD, [facility] 04/04/2021 2:41 PM    [place]

## 2021-04-10 ENCOUNTER — Other Ambulatory Visit: Payer: Self-pay | Admitting: Family

## 2021-04-11 ENCOUNTER — Other Ambulatory Visit: Payer: Self-pay | Admitting: Family

## 2021-04-11 ENCOUNTER — Other Ambulatory Visit (HOSPITAL_BASED_OUTPATIENT_CLINIC_OR_DEPARTMENT_OTHER): Payer: Self-pay

## 2021-04-11 MED ORDER — METFORMIN HCL 500 MG PO TABS
500.0000 mg | ORAL_TABLET | Freq: Two times a day (BID) | ORAL | 1 refills | Status: DC
  Filled 2021-04-11 – 2021-04-27 (×3): qty 60, 30d supply, fill #0

## 2021-04-11 MED ORDER — LOSARTAN POTASSIUM 50 MG PO TABS
50.0000 mg | ORAL_TABLET | Freq: Every day | ORAL | 1 refills | Status: DC
  Filled 2021-04-11: qty 30, 30d supply, fill #0

## 2021-04-21 ENCOUNTER — Other Ambulatory Visit (HOSPITAL_BASED_OUTPATIENT_CLINIC_OR_DEPARTMENT_OTHER): Payer: Self-pay

## 2021-04-24 ENCOUNTER — Other Ambulatory Visit: Payer: Self-pay | Admitting: Family

## 2021-04-27 ENCOUNTER — Other Ambulatory Visit (HOSPITAL_BASED_OUTPATIENT_CLINIC_OR_DEPARTMENT_OTHER): Payer: Self-pay

## 2021-05-18 ENCOUNTER — Other Ambulatory Visit: Payer: Self-pay | Admitting: Family

## 2021-06-02 ENCOUNTER — Ambulatory Visit (INDEPENDENT_AMBULATORY_CARE_PROVIDER_SITE_OTHER): Payer: Medicare Other | Admitting: Family

## 2021-06-02 VITALS — BP 160/109 | HR 85 | Temp 97.7°F | Resp 16 | Ht <= 58 in | Wt 142.4 lb

## 2021-06-02 DIAGNOSIS — I10 Essential (primary) hypertension: Secondary | ICD-10-CM

## 2021-06-02 DIAGNOSIS — E785 Hyperlipidemia, unspecified: Secondary | ICD-10-CM

## 2021-06-02 DIAGNOSIS — Z1211 Encounter for screening for malignant neoplasm of colon: Secondary | ICD-10-CM

## 2021-06-02 DIAGNOSIS — E1129 Type 2 diabetes mellitus with other diabetic kidney complication: Secondary | ICD-10-CM | POA: Diagnosis not present

## 2021-06-02 DIAGNOSIS — R809 Proteinuria, unspecified: Secondary | ICD-10-CM

## 2021-06-02 MED ORDER — FENOFIBRATE 160 MG PO TABS: 160.0000 mg | ORAL_TABLET | Freq: Every day | ORAL | 1 refills | Status: DC

## 2021-06-02 MED ORDER — METFORMIN HCL 500 MG PO TABS: 500.0000 mg | ORAL_TABLET | Freq: Two times a day (BID) | ORAL | 1 refills | Status: DC

## 2021-06-02 MED ORDER — LOSARTAN POTASSIUM 100 MG PO TABS: 100.0000 mg | ORAL_TABLET | Freq: Every day | ORAL | 1 refills | Status: DC

## 2021-06-02 NOTE — Patient Instructions (Signed)
Please complete lab work prior to leaving. Increase losartan from 50mg  to 100mg  for blood pressure.

## 2021-06-02 NOTE — Progress Notes (Signed)
Subjective:     Patient ID: Katherine Kelly, female    DOB: [birth year], 66 y.o.   MRN: [id number]  Chief Complaint  Patient presents with   Medication Refill    Here for Medication Refill    HPI Patient is in today for medication follow up. She is accompanied today by her daughter who   DM2- She has cut back on rice.  Lab Results  Component Value Date   HGBA1C 6.9 (H) 06/02/2021   HGBA1C 7.2 (H) 11/14/2020   HGBA1C 7.0 (H) 07/18/2020   Lab Results  Component Value Date   MICROALBUR 2.3 (H) 12/14/2014   LDLCALC 53 06/02/2021   CREATININE 0.80 06/02/2021   Hyperlipidemia-  Lab Results  Component Value Date   CHOL 132 06/02/2021   HDL 58 06/02/2021   LDLCALC 53 06/02/2021   LDLDIRECT 64.9 12/17/2006   TRIG 130 06/02/2021   CHOLHDL 2.3 06/02/2021   HTN- She continues losartan 8m once daily.  BP Readings from Last 3 Encounters:  06/02/21 (!) 160/109  04/04/21 (!) 146/88  11/01/20 138/70   She is overdue for colonoscopy- Declines to place order.   Declines to schedule mammogram.     Health Maintenance Due  Topic Date Due   Pneumonia Vaccine 66 Years old (2 - PCV) 09/01/2014   OPHTHALMOLOGY EXAM  01/12/2015   COLONOSCOPY (Pts 45-451yrInsurance coverage will need to be confirmed)  09/09/2017   TETANUS/TDAP  12/21/2020   Zoster Vaccines- Shingrix (2 of 2) 12/27/2020   DEXA SCAN  Never done    Past Medical History:  Diagnosis Date   ACE-inhibitor cough    ALLERGIC RHINITIS 01/03/2009   Qualifier: Diagnosis of  By: YoWynona Luna  Aortic valve stenosis 08/30/2020   Cataract, bilateral    COVID-19 vaccine series declined 11/01/2020   Diabetes mellitus, type 2 ([place]06/2008   no meds   Diabetes type 2, controlled ([place]08/13/2008   Qualifier: Diagnosis of  By: ElLoanne DrillingD, Sean A    Diabetic neuropathy ([place]/18/2015   Eczema 09/14/2014   Essential hypertension 11/27/2006   Qualifier: Diagnosis of  By: ElLoanne DrillingD, Sean A    History of colonic  polyps    Hypertension    RHINOSINUSITIS, CHRONIC 07/07/2009   Qualifier: Diagnosis of  By: YoWynona Luna   Past Surgical History:  Procedure Laterality Date   BREAST BIOPSY Left    stereo tatic   CARDIOVASCULAR STRESS TEST  11/08/2006   CESAREAN SECTION  1980 & 1982   COLONOSCOPY     ESOPHAGOGASTRODUODENOSCOPY  04/2006   POLYPECTOMY      Family History  Problem Relation Age of Onset   Colon cancer Neg Hx    Rectal cancer Neg Hx    Stomach cancer Neg Hx     Social History   Socioeconomic History   Marital status: Married    Spouse name: Not on file   Number of children: Not on file   Years of education: Not on file   Highest education level: Not on file  Occupational History   Not on file  Tobacco Use   Smoking status: Never   Smokeless tobacco: Never  Substance and Sexual Activity   Alcohol use: No   Drug use: No   Sexual activity: Not on file  Other Topics Concern   Not on file  Social History Narrative   Lives with husband and daughter   [place]  No pets   Did not complete high school   Enjoys travel and shopping   Social Determinants of Health   Financial Resource Strain: Not on file  Food Insecurity: Not on file  Transportation Needs: Not on file  Physical Activity: Not on file  Stress: Not on file  Social Connections: Not on file  Intimate Partner Violence: Not on file    Outpatient Medications Prior to Visit  Medication Sig Dispense Refill   betamethasone dipropionate (DIPROLENE) 0.05 % cream Apply 1 application topically daily as needed. 30 g 1   cholecalciferol (VITAMIN D) 1000 UNITS tablet Take 1,000 Units by mouth daily. Reported on 10/25/2015     Clobetasol Prop Emollient Base 0.05 % emollient cream Apply 1 application topically 2 (two) times daily as needed. 30 g 1   simvastatin (ZOCOR) 10 MG tablet TAKE 1 TABLET(10 MG) BY MOUTH AT BEDTIME 90 tablet 2   fenofibrate 160 MG tablet Take 160 mg by mouth daily.      losartan (COZAAR) 50 MG tablet TAKE 1 TABLET(50 MG) BY MOUTH DAILY 30 tablet 1   metFORMIN (GLUCOPHAGE) 500 MG tablet Take 1 tablet (500 mg total) by mouth 2 (two) times daily with a meal. 60 tablet 1   No facility-administered medications prior to visit.    Allergies  Allergen Reactions   Ace Inhibitors     weakness    ROS See HPI    Objective:    Physical Exam Constitutional:      General: She is not in acute distress.    Appearance: Normal appearance. She is well-developed.  HENT:     Head: Normocephalic and atraumatic.     Right Ear: External ear normal.     Left Ear: External ear normal.  Eyes:     General: No scleral icterus. Neck:     Thyroid: No thyromegaly.  Cardiovascular:     Rate and Rhythm: Normal rate and regular rhythm.     Heart sounds: Murmur heard.  Pulmonary:     Effort: Pulmonary effort is normal. No respiratory distress.     Breath sounds: Normal breath sounds. No wheezing.  Musculoskeletal:        General: No swelling.     Cervical back: Neck supple.  Skin:    General: Skin is warm and dry.  Neurological:     Mental Status: She is alert and oriented to person, place, and time.  Psychiatric:        Mood and Affect: Mood normal.        Behavior: Behavior normal.        Thought Content: Thought content normal.        Judgment: Judgment normal.    BP (!) 160/109 (BP Location: Right Arm, Patient Position: Sitting, Cuff Size: Small)    Pulse 85    Temp 97.7 F (36.5 C) (Oral)    Resp 16    Ht '4\' 10"'  (1.473 m)    Wt 142 lb 6.4 oz (64.6 kg)    SpO2 100%    BMI 29.76 kg/m  Wt Readings from Last 3 Encounters:  06/02/21 142 lb 6.4 oz (64.6 kg)  04/04/21 144 lb (65.3 kg)  11/01/20 142 lb (64.4 kg)       Assessment & Plan:   Problem List Items Addressed This Visit       Unprioritized   Essential hypertension    Uncontrolled.  Increase losartan from 61m to 1071m       Relevant Medications  losartan (COZAAR) 100 MG tablet   fenofibrate  160 MG tablet   Dyslipidemia    Obtain follow up lipid panel.  Continue statin/fibrate.       Relevant Medications   fenofibrate 160 MG tablet   Other Relevant Orders   Lipid panel (Completed)   Diabetes type 2, controlled ([place]) - Primary    Clinically stable. Obtain follow up A1C.  Continue DM diet.       Relevant Medications   losartan (COZAAR) 100 MG tablet   metFORMIN (GLUCOPHAGE) 500 MG tablet   Other Relevant Orders   Comp Met (CMET) (Completed)   Hemoglobin A1c (Completed)   Other Visit Diagnoses     Colon cancer screening       Relevant Orders   Ambulatory referral to Gastroenterology       I have discontinued Lucianne Lei D. Leis's losartan. I have also changed her fenofibrate. Additionally, I am having her start on losartan. Lastly, I am having her maintain her cholecalciferol, Clobetasol Prop Emollient Base, betamethasone dipropionate, simvastatin, and metFORMIN.  Meds ordered this encounter  Medications   losartan (COZAAR) 100 MG tablet    Sig: Take 1 tablet (100 mg total) by mouth daily.    Dispense:  90 tablet    Refill:  1    Order Specific Question:   Supervising Provider    Answer:   Penni Homans A [[id number]]   fenofibrate 160 MG tablet    Sig: Take 1 tablet (160 mg total) by mouth daily.    Dispense:  90 tablet    Refill:  1    Order Specific Question:   Supervising Provider    Answer:   Penni Homans A [[id number]]   metFORMIN (GLUCOPHAGE) 500 MG tablet    Sig: Take 1 tablet (500 mg total) by mouth 2 (two) times daily with a meal.    Dispense:  180 tablet    Refill:  1    Order Specific Question:   Supervising Provider    Answer:   Penni Homans A [[id number]]

## 2021-06-03 LAB — COMPREHENSIVE METABOLIC PANEL
AG Ratio: 1.6 (calc) (ref 1.0–2.5)
ALT: 33 U/L — ABNORMAL HIGH (ref 6–29)
AST: 34 U/L (ref 10–35)
Albumin: 4.8 g/dL (ref 3.6–5.1)
Alkaline phosphatase (APISO): 57 U/L (ref 37–153)
BUN: 17 mg/dL (ref 7–25)
CO2: 26 mmol/L (ref 20–32)
Calcium: 9.9 mg/dL (ref 8.6–10.4)
Chloride: 105 mmol/L (ref 98–110)
Creat: 0.8 mg/dL (ref 0.50–1.05)
Globulin: 3 g/dL (calc) (ref 1.9–3.7)
Glucose, Bld: 115 mg/dL — ABNORMAL HIGH (ref 65–99)
Potassium: 4.1 mmol/L (ref 3.5–5.3)
Sodium: 141 mmol/L (ref 135–146)
Total Bilirubin: 0.7 mg/dL (ref 0.2–1.2)
Total Protein: 7.8 g/dL (ref 6.1–8.1)

## 2021-06-03 LAB — LIPID PANEL
Cholesterol: 132 mg/dL (ref <200)
HDL: 58 mg/dL (ref >=50)
LDL Cholesterol (Calc): 53 mg/dL (calc)
Non-HDL Cholesterol (Calc): 74 mg/dL (calc) (ref <130)
Total CHOL/HDL Ratio: 2.3 (calc) (ref <5.0)
Triglycerides: 130 mg/dL (ref <150)

## 2021-06-03 LAB — HEMOGLOBIN A1C
Hgb A1c MFr Bld: 6.9 % of total Hgb — ABNORMAL HIGH (ref <5.7)
Mean Plasma Glucose: 151 mg/dL
eAG (mmol/L): 8.4 mmol/L

## 2021-06-03 NOTE — Assessment & Plan Note (Signed)
Clinically stable. Obtain follow up A1C.  Continue DM diet.

## 2021-06-03 NOTE — Assessment & Plan Note (Signed)
Obtain follow up lipid panel.  Continue statin/fibrate.

## 2021-06-03 NOTE — Assessment & Plan Note (Signed)
Uncontrolled.  Increase losartan from 50mg  to 100mg .

## 2021-06-13 ENCOUNTER — Ambulatory Visit: Payer: Medicare Other | Admitting: Family

## 2021-06-19 ENCOUNTER — Ambulatory Visit (INDEPENDENT_AMBULATORY_CARE_PROVIDER_SITE_OTHER): Payer: 59 | Admitting: Family

## 2021-06-19 DIAGNOSIS — I10 Essential (primary) hypertension: Secondary | ICD-10-CM

## 2021-06-19 DIAGNOSIS — E785 Hyperlipidemia, unspecified: Secondary | ICD-10-CM

## 2021-06-19 DIAGNOSIS — E1129 Type 2 diabetes mellitus with other diabetic kidney complication: Secondary | ICD-10-CM

## 2021-06-19 DIAGNOSIS — R809 Proteinuria, unspecified: Secondary | ICD-10-CM

## 2021-06-19 LAB — BASIC METABOLIC PANEL
BUN: 15 mg/dL (ref 6–23)
CO2: 24 mEq/L (ref 19–32)
Calcium: 10.1 mg/dL (ref 8.4–10.5)
Chloride: 102 mEq/L (ref 96–112)
Creatinine, Ser: 0.84 mg/dL (ref 0.40–1.20)
GFR: 73.03 mL/min (ref >60.00)
Glucose, Bld: 108 mg/dL — ABNORMAL HIGH (ref 70–99)
Potassium: 4.5 mEq/L (ref 3.5–5.1)
Sodium: 138 mEq/L (ref 135–145)

## 2021-06-19 MED ORDER — LOSARTAN POTASSIUM-HCTZ 100-25 MG PO TABS: 1.0000 | ORAL_TABLET | Freq: Every day | ORAL | 0 refills | Status: DC

## 2021-06-19 NOTE — Assessment & Plan Note (Addendum)
Lab Results  ?Component Value Date  ? CHOL 132 06/02/2021  ? HDL 58 06/02/2021  ? [place] 53 06/02/2021  ? LDLDIRECT 64.9 12/17/2006  ? TRIG 130 06/02/2021  ? CHOLHDL 2.3 06/02/2021  ? ?LDL and Triglycerides at goal. Continue simvastatin/fenofibrate.  ? ?

## 2021-06-19 NOTE — Patient Instructions (Signed)
Stop Losartan, start Losartan-HCTZ once daily. ?Check your blood pressure once daily at home and bring your readings next week.  ? ? ?

## 2021-06-19 NOTE — Assessment & Plan Note (Addendum)
BP Readings from Last 3 Encounters:  ?06/19/21 (!) 163/109  ?06/02/21 (!) 160/109  ?04/04/21 (!) 146/88  ? ?Uncontrolled.  Pt is advised as follows:  ? ?Stop Losartan, start Losartan-HCTZ once daily. ?Check your blood pressure once daily at home and bring your readings next week.  ? ? ?

## 2021-06-19 NOTE — Progress Notes (Signed)
Subjective:   By signing my name below, I, Zite Okoli, attest that this documentation has been prepared under the direction and in the presence of Debbrah Alar, NP 06/19/2021       Patient ID: Katherine Kelly, female    DOB: 1955/04/26, 66 y.o.   MRN: [id number]  Chief Complaint  Patient presents with   Hypertension    Here for follow up    HPI Patient is in today for an office visit and 2 week f/u. She is accompanied by her daughter.  Hypertension- At her last visit, her blood pressure was increased from 50 mg to 100 mg. Her blood pressure is elevated at the beginning of this visit-163/109. She checks her blood pressure at home and reports it was 126/85 this morning. She adds systolic was at 741 yesterday. Her daughter reports her blood pressure has never been this high. She takes her medication in the mornings.   Recheck- 170/110  Diabetes- She reports her sugar level was at 130 the last time she checked. Last lab work showed stable A1C and cholesterol levels.   Past Medical History:  Diagnosis Date   ACE-inhibitor cough    ALLERGIC RHINITIS 01/03/2009   Qualifier: Diagnosis of  By: Wynona Luna    Aortic valve stenosis 08/30/2020   Cataract, bilateral    COVID-19 vaccine series declined 11/01/2020   Diabetes mellitus, type 2 ([place]) 09/2006   no meds   Diabetes type 2, controlled ([place]) 11/27/2006   Qualifier: Diagnosis of  By: Loanne Drilling MD, Sean A    Diabetic neuropathy ([place]) 08/31/2013   Eczema 09/14/2014   Essential hypertension 11/27/2006   Qualifier: Diagnosis of  By: Loanne Drilling MD, Sean A    History of colonic polyps    Hypertension    RHINOSINUSITIS, CHRONIC 07/07/2009   Qualifier: Diagnosis of  By: Wynona Luna     Past Surgical History:  Procedure Laterality Date   BREAST BIOPSY Left    stereo tatic   CARDIOVASCULAR STRESS TEST  11/08/2006   CESAREAN SECTION  1980 & 1982   COLONOSCOPY     ESOPHAGOGASTRODUODENOSCOPY  04/2006   POLYPECTOMY       Family History  Problem Relation Age of Onset   Colon cancer Neg Hx    Rectal cancer Neg Hx    Stomach cancer Neg Hx     Social History   Socioeconomic History   Marital status: Married    Spouse name: Not on file   Number of children: Not on file   Years of education: Not on file   Highest education level: Not on file  Occupational History   Not on file  Tobacco Use   Smoking status: Never   Smokeless tobacco: Never  Substance and Sexual Activity   Alcohol use: No   Drug use: No   Sexual activity: Not on file  Other Topics Concern   Not on file  Social History Narrative   Lives with husband and daughter   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 30 g 1   cholecalciferol (VITAMIN D) 1000 UNITS tablet Take 1,000 Units by mouth daily. Reported on 10/25/2015     Clobetasol Prop Emollient Base 0.05 % emollient cream Apply 1 application topically 2 (two) times daily as needed. 30 g 1   fenofibrate 160 MG tablet Take 1 tablet (160 mg total) by mouth daily. 90 tablet 1   metFORMIN (GLUCOPHAGE) 500 MG tablet Take 1 tablet (500 mg total) by mouth 2 (two) times daily with a meal. 180 tablet 1   simvastatin (ZOCOR) 10 MG tablet TAKE 1 TABLET(10 MG) BY MOUTH AT BEDTIME 90 tablet 2   losartan (COZAAR) 100 MG tablet Take 1 tablet (100 mg total) by mouth daily. 90 tablet 1   No facility-administered medications prior to visit.    Allergies  Allergen Reactions   Ace Inhibitors      weakness    ROS    See HPI Objective:    Physical Exam Constitutional:      General: She is not in acute distress.    Appearance: Normal appearance. She is well-developed.  HENT:     Head: Normocephalic and atraumatic.     Right Ear: External ear normal.     Left Ear: External ear normal.  Eyes:     General: No scleral icterus. Neck:     Thyroid: No thyromegaly.  Cardiovascular:     Rate and Rhythm: Normal rate and regular rhythm.     Heart sounds: Normal heart sounds. No murmur heard. Pulmonary:     Effort: Pulmonary effort is normal. No respiratory distress.     Breath sounds: Normal breath sounds. No wheezing.  Musculoskeletal:     Cervical back: Neck supple.  Skin:    General: Skin is warm and dry.  Neurological:     Mental Status: She is alert and oriented to person, place, and time.  Psychiatric:        Mood and Affect: Mood normal.        Behavior: Behavior normal.        Thought Content: Thought content normal.        Judgment: Judgment normal.    BP (!) 163/109 (BP Location: Right Arm, Patient Position: Sitting, Cuff Size: Small)    Pulse (!) 59    Temp 97.7 F (36.5 C) (Oral)    Resp 16    Wt 137 lb (62.1 kg)    SpO2 99%    BMI 28.63 kg/m  Wt Readings from Last 3 Encounters:  06/19/21 137 lb (62.1 kg)  06/02/21 142 lb 6.4 oz (64.6 kg)  04/04/21 144 lb (65.3 kg)    Diabetic Foot Exam - Simple   No data filed    Lab Results  Component Value Date   WBC 6.8 12/04/2016   HGB 13.2 12/04/2016   HCT 40.5 12/04/2016   PLT 217.0 12/04/2016   GLUCOSE 115 (H) 06/02/2021   CHOL 132 06/02/2021   TRIG 130 06/02/2021   HDL 58 06/02/2021   LDLDIRECT 64.9 12/17/2006   LDLCALC 53 06/02/2021   ALT 33 (H) 06/02/2021   AST 34 06/02/2021   NA 141 06/02/2021   K 4.1 06/02/2021   CL 105 06/02/2021   CREATININE 0.80 06/02/2021   BUN 17 06/02/2021   CO2 26 06/02/2021   TSH 1.74 12/04/2016   INR  10/03/2016     Comment:     Erroneous  entry   HGBA1C 6.9 (H)  06/02/2021   MICROALBUR 2.3 (H) 12/14/2014    Lab Results  Component Value Date   TSH 1.74 12/04/2016   Lab Results  Component Value Date   WBC 6.8 12/04/2016   HGB 13.2 12/04/2016   HCT 40.5 12/04/2016   MCV 100.2 (H) 12/04/2016   PLT 217.0 12/04/2016   Lab Results  Component Value Date   NA 141 06/02/2021   K 4.1 06/02/2021   CO2 26 06/02/2021   GLUCOSE 115 (H) 06/02/2021   BUN 17 06/02/2021   CREATININE 0.80 06/02/2021   BILITOT 0.7 06/02/2021   ALKPHOS 61 03/31/2019   AST 34 06/02/2021   ALT 33 (H) 06/02/2021   PROT 7.8 06/02/2021   ALBUMIN 4.6 03/31/2019   CALCIUM 9.9 06/02/2021   GFR 71.30 11/14/2020   Lab Results  Component Value Date   CHOL 132 06/02/2021   Lab Results  Component Value Date   HDL 58 06/02/2021   Lab Results  Component Value Date   LDLCALC 53 06/02/2021   Lab Results  Component Value Date   TRIG 130 06/02/2021   Lab Results  Component Value Date   CHOLHDL 2.3 06/02/2021   Lab Results  Component Value Date   HGBA1C 6.9 (H) 06/02/2021       Assessment & Plan:   Problem List Items Addressed This Visit       Unprioritized   Essential hypertension    BP Readings from Last 3 Encounters:  06/19/21 (!) 163/109  06/02/21 (!) 160/109  04/04/21 (!) 146/88  Uncontrolled.  Pt is advised as follows:   Stop Losartan, start Losartan-HCTZ once daily. Check your blood pressure once daily at home and bring your readings next week.         Relevant Medications   losartan-hydrochlorothiazide (HYZAAR) 100-25 MG tablet   Dyslipidemia    Lab Results  Component Value Date   CHOL 132 06/02/2021   HDL 58 06/02/2021   LDLCALC 53 06/02/2021   LDLDIRECT 64.9 12/17/2006   TRIG 130 06/02/2021   CHOLHDL 2.3 06/02/2021  LDL and Triglycerides at goal. Continue simvastatin/fenofibrate.        Diabetes type 2, controlled ([place])    Lab Results  Component Value Date   HGBA1C 6.9 (H) 06/02/2021   HGBA1C 7.2 (H) 11/14/2020   HGBA1C 7.0  (H) 07/18/2020   Lab Results  Component Value Date   MICROALBUR 2.3 (H) 12/14/2014   LDLCALC 53 06/02/2021   CREATININE 0.80 06/02/2021   Last A1C was at goal. Continue metformin.       Relevant Medications   losartan-hydrochlorothiazide (HYZAAR) 100-25 MG tablet   Other Relevant Orders   Basic metabolic panel    Meds ordered this encounter  Medications   DISCONTD: losartan-hydrochlorothiazide (HYZAAR) 100-25 MG tablet    Sig: Take 1 tablet by mouth daily.    Dispense:  90 tablet    Refill:  0    Order Specific Question:   Supervising Provider    Answer:   Penni Homans A [[id number]]   losartan-hydrochlorothiazide (HYZAAR) 100-25 MG tablet    Sig: Take 1 tablet by mouth daily.    Dispense:  90 tablet    Refill:  0    Order Specific Question:   Supervising Provider    Answer:   Penni Homans A [[id number]]    I,Zite Okoli,acting as a scribe for [name], NP.,have documented all relevant documentation on the behalf of LORIN HAUCK, NP,as  directed by  [name], NP while in the presence of [name], NP.   I, Debbrah Alar, NP, personally preformed the services described in this documentation.  All medical record entries made by the scribe were at my direction and in my presence.  I have reviewed the chart and discharge instructions (if applicable) and agree that the record reflects my personal performance and is accurate and complete. 06/19/2021

## 2021-06-19 NOTE — Assessment & Plan Note (Addendum)
Lab Results  ?Component Value Date  ? HGBA1C 6.9 (H) 06/02/2021  ? HGBA1C 7.2 (H) 11/14/2020  ? HGBA1C 7.0 (H) 07/18/2020  ? ?Lab Results  ?Component Value Date  ? MICROALBUR 2.3 (H) 12/14/2014  ? Provencal 53 06/02/2021  ? CREATININE 0.80 06/02/2021  ? ? ?Last A1C was at goal. Continue metformin.  ?

## 2021-06-23 ENCOUNTER — Other Ambulatory Visit: Payer: Self-pay | Admitting: Family

## 2021-07-03 ENCOUNTER — Ambulatory Visit (INDEPENDENT_AMBULATORY_CARE_PROVIDER_SITE_OTHER): Payer: 59 | Admitting: Family

## 2021-07-03 VITALS — BP 154/81 | HR 79 | Temp 97.6°F | Resp 16 | Wt 140.0 lb

## 2021-07-03 DIAGNOSIS — I10 Essential (primary) hypertension: Secondary | ICD-10-CM

## 2021-07-03 DIAGNOSIS — L309 Dermatitis, unspecified: Secondary | ICD-10-CM | POA: Diagnosis not present

## 2021-07-03 LAB — BASIC METABOLIC PANEL
BUN: 25 mg/dL — ABNORMAL HIGH (ref 6–23)
CO2: 28 mEq/L (ref 19–32)
Calcium: 10.1 mg/dL (ref 8.4–10.5)
Chloride: 101 mEq/L (ref 96–112)
Creatinine, Ser: 0.87 mg/dL (ref 0.40–1.20)
GFR: 70 mL/min (ref >60.00)
Glucose, Bld: 116 mg/dL — ABNORMAL HIGH (ref 70–99)
Potassium: 4.1 mEq/L (ref 3.5–5.1)
Sodium: 138 mEq/L (ref 135–145)

## 2021-07-03 MED ORDER — CLOBETASOL PROP EMOLLIENT BASE 0.05 % EX CREA: 1.0000 "application " | TOPICAL_CREAM | Freq: Two times a day (BID) | CUTANEOUS | 1 refills | Status: AC | PRN

## 2021-07-03 NOTE — Assessment & Plan Note (Signed)
BP readings from home are at goal. She brings with her today her bp monitor which is aligned with our manual readings.  She definitely appears to have overlying white coat hypertension. I have advised her to continue to monitor her readings at home. ? ?

## 2021-07-03 NOTE — Assessment & Plan Note (Signed)
Stable, refill provided for clobetasol cream. ?

## 2021-07-03 NOTE — Progress Notes (Signed)
? ?Subjective:  ? ?By signing my name below, I, Katherine Kelly, attest that this documentation has been prepared under the direction and in the presence of Katherine Alar NP. 07/03/2021 ? ? ? Patient ID: Katherine Kelly, female    DOB: 02-01-1956, 66 y.o.   MRN: [id number] ? ?Chief Complaint  ?Patient presents with  ? Hypertension  ?  Here for follow up after medication increased  ? ? ?Hypertension ? ?Patient is in today for a office visit.  ? ?Blood pressure- During her last visit her losartan was changed to 100-25 mg losartan-HCT daily PO and she reports doing well while taking it. She also reports feeling better while taking it. She recorded her blood pressure this past week and they ranged from 115-136/81-90. ?BP Readings from Last 3 Encounters:  ?07/03/21 (!) 154/81  ?06/19/21 (!) 163/109  ?06/02/21 (!) 160/109  ? ?Pulse Readings from Last 3 Encounters:  ?07/03/21 79  ?06/19/21 (!) 59  ?06/02/21 85  ? ?Blood sugar- Her blood sugars improved during her last visit. Her son reports she is participating in more exercise and thinks that may have contributed to her lower blood sugar.  ?Lab Results  ?Component Value Date  ? HGBA1C 6.9 (H) 06/02/2021  ? ?Itching- Her son is requesting a refill for Clobetasol Prop emollient base cream to manage her itching on her hand.  ? ? ?Health Maintenance Due  ?Topic Date Due  ? Pneumonia Vaccine 58+ Years old (2 - PCV) 09/01/2014  ? OPHTHALMOLOGY EXAM  01/12/2015  ? COLONOSCOPY (Pts 45-33yr Insurance coverage will need to be confirmed)  09/09/2017  ? TETANUS/TDAP  12/21/2020  ? Zoster Vaccines- Shingrix (2 of 2) 12/27/2020  ? DEXA SCAN  Never done  ? ? ?Past Medical History:  ?Diagnosis Date  ? ACE-inhibitor cough   ? ALLERGIC RHINITIS 01/03/2009  ? Qualifier: Diagnosis of  By: YWynona Luna  ? Aortic valve stenosis 08/30/2020  ? Cataract, bilateral   ? COVID-19 vaccine series declined 11/01/2020  ? Diabetes mellitus, type 2 ([facility] 09/2006  ? no meds  ? Diabetes type 2,  controlled ([facility] 11/27/2006  ? Qualifier: Diagnosis of  By: ELoanne DrillingMD, SJacelyn Pi  ? Diabetic neuropathy ([facility] 08/31/2013  ? Eczema 09/14/2014  ? Essential hypertension 11/27/2006  ? Qualifier: Diagnosis of  By: ELoanne DrillingMD, SJacelyn Pi  ? History of colonic polyps   ? Hypertension   ? RHINOSINUSITIS, CHRONIC 07/07/2009  ? Qualifier: Diagnosis of  By: YWynona Luna  ? ? ?Past Surgical History:  ?Procedure Laterality Date  ? BREAST BIOPSY Left   ? stereo tatic  ? CARDIOVASCULAR STRESS TEST  11/08/2006  ? [place] ? COLON[place]COPY    ? ESOPHAGOGASTRODUODENOSCOPY  04/2006  ? POLYPECTOMY    ? ? ?Family History  ?Problem Relation Age of Onset  ? Colon cancer Neg Hx   ? Rectal cancer Neg Hx   ? Stomach cancer Neg Hx   ? ? ?Social History  ? ?Socioeconomic History  ? Marital status: Married  ?  Spouse name: Not on file  ? Number of children: Not on file  ? Years of education: Not on file  ? Highest education level: Not on file  ?Occupational History  ? Not on file  ?Tobacco Use  ? Smoking status: Never  ? Smokeless tobacco: Never  ?Substance and Sexual Activity  ? Alcohol use: No  ? Drug use: No  ? Sexual  activity: Not on file  ?Other Topics Concern  ? Not on file  ?Social History Narrative  ? Lives with husband and daughter  ? Manager at [employer]  ? No pets  ? Did not complete high school  ? Enjoys travel and shopping  ? ?Social Determinants of Health  ? ?Financial Resource Strain: Not on file  ?Food Insecurity: Not on file  ?Transportation Needs: Not on file  ?Physical Activity: Not on file  ?Stress: Not on file  ?Social Connections: Not on file  ?Intimate Partner Violence: Not on file  ? ? ?Outpatient Medications Prior to Visit  ?Medication Sig Dispense Refill  ? betamethasone dipropionate (DIPROLENE) 0.05 % cream Apply 1 application topically daily as needed. 30 g 1  ? cholecalciferol (VITAMIN D) 1000 UNITS tablet Take 1,000 Units by mouth daily. Reported on 10/25/2015    ? fenofibrate 160 MG  tablet TAKE 1 TABLET(160 MG) BY MOUTH DAILY 90 tablet 1  ? losartan-hydrochlorothiazide (HYZAAR) 100-25 MG tablet Take 1 tablet by mouth daily. 90 tablet 0  ? metFORMIN (GLUCOPHAGE) 500 MG tablet Take 1 tablet (500 mg total) by mouth 2 (two) times daily with a meal. 180 tablet 1  ? simvastatin (ZOCOR) 10 MG tablet TAKE 1 TABLET(10 MG) BY MOUTH AT BEDTIME 90 tablet 2  ? Clobetasol Prop Emollient Base 0.05 % emollient cream Apply 1 application topically 2 (two) times daily as needed. 30 g 1  ? ?No facility-administered medications prior to visit.  ? ? ?Allergies  ?Allergen Reactions  ? Ace Inhibitors   ?  weakness  ? ? ?Review of Systems  ?Skin:  Positive for itching (bilateral hands).  ? ?   ?Objective:  ?  ?Physical Exam ?Constitutional:   ?   General: She is not in acute distress. ?   Appearance: Normal appearance. She is not ill-appearing.  ?HENT:  ?   Head: Normocephalic and atraumatic.  ?   Right Ear: External ear normal.  ?   Left Ear: External ear normal.  ?Eyes:  ?   Extraocular Movements: Extraocular movements intact.  ?   Pupils: Pupils are equal, round, and reactive to light.  ?Cardiovascular:  ?   Rate and Rhythm: Normal rate and regular rhythm.  ?   Heart sounds: Murmur heard.  ?Systolic murmur is present with a grade of 2/6.  ?  No gallop.  ?Pulmonary:  ?   Effort: Pulmonary effort is normal. No respiratory distress.  ?   Breath sounds: Normal breath sounds. No wheezing or rales.  ?Skin: ?   General: Skin is warm and dry.  ?Neurological:  ?   Mental Status: She is alert and oriented to person, place, and time.  ?Psychiatric:     ?   Judgment: Judgment normal.  ? ? ?BP (!) 154/81 (BP Location: Right Arm, Patient Position: Sitting, Cuff Size: Small)   Pulse 79   Temp 97.6 ?F (36.4 ?C) (Oral)   Resp 16   Wt 140 lb (63.5 kg)   SpO2 99%   BMI 29.26 kg/m?  ?Wt Readings from Last 3 Encounters:  ?07/03/21 140 lb (63.5 kg)  ?06/19/21 137 lb (62.1 kg)  ?06/02/21 142 lb 6.4 oz (64.6 kg)  ? ? ?   ?Assessment  & Plan:  ? ?Problem List Items Addressed This Visit   ? ?  ? Unprioritized  ? Essential hypertension  ?  BP readings from home are at goal. She brings with her today her bp monitor which is aligned  with our manual readings.  She definitely appears to have overlying white coat hypertension. I have advised her to continue to monitor her readings at home. ? ?  ?  ? ?Other Visit Diagnoses   ? ? Primary hypertension    -  Primary  ? Relevant Orders  ? Basic metabolic panel  ? ?  ? ? ?Meds ordered this encounter  ?Medications  ? Clobetasol Prop Emollient Base 0.05 % emollient cream  ?  Sig: Apply 1 application. topically 2 (two) times daily as needed.  ?  Dispense:  30 g  ?  Refill:  1  ?  Order Specific Question:   Supervising Provider  ?  Answer:   Penni Homans A [[id number]]  ? ? ?I, [name], NP, personally preformed the services described in this documentation.  All medical record entries made by the scribe were at my direction and in my presence.  I have reviewed the chart and discharge instructions (if applicable) and agree that the record reflects my personal performance and is accurate and complete. '@ENCDATE'$ @ ? ? ? ? ?[name], NP ? ?

## 2021-07-10 ENCOUNTER — Encounter: Payer: Self-pay | Admitting: Internal Medicine

## 2021-07-12 ENCOUNTER — Telehealth: Payer: Self-pay | Admitting: *Deleted

## 2021-07-12 NOTE — Telephone Encounter (Signed)
[name] is scheduled for recall colon on 4/25. Pt has diagnosis of mild to moderate Aortic Valve Stenosis. Please review chart and advise if ok to proceed at [facility]? ?Thank you, ? ?Lattie Haw PV ?

## 2021-07-25 ENCOUNTER — Ambulatory Visit (AMBULATORY_SURGERY_CENTER): Payer: Medicare Other

## 2021-07-25 VITALS — Ht 62.0 in | Wt 135.0 lb

## 2021-07-25 DIAGNOSIS — Z86010 Personal history of colonic polyps: Secondary | ICD-10-CM

## 2021-07-25 MED ORDER — SUTAB 1479-225-188 MG PO TABS: 1.0000 | ORAL_TABLET | ORAL | 0 refills | Status: DC

## 2021-07-25 NOTE — Progress Notes (Signed)
No egg or soy allergy known to patient  ?No issues known to pt with past sedation with any surgeries or procedures ?Patient denies ever being told they had issues or difficulty with intubation  ?No FH of Malignant Hyperthermia ?Pt is not on diet pills ?Pt is not on home 02  ?Pt is not on blood thinners  ?Pt denies issues with constipation;  ?No A fib or A flutter ?NO PA's for preps discussed with pt in PV today  ?Discussed with pt there will be an out-of-pocket cost for prep and that varies from $0 to 70 + dollars - pt verbalized understanding  ?Due to the COVID-19 pandemic we are asking patients to follow certain guidelines in PV and the [place]   ?Pt aware of COVID protocols and [facility] guidelines  ?PV completed over the phone. Pt verified name, DOB, address and insurance during PV today.  ?Pt mailed instruction packet with copy of consent form to read and not return, and instructions.  ?Pt encouraged to call with questions or issues.  ?If pt has My chart, procedure instructions sent via My Chart  ? ?Female family member, translated the entire PV appt for RN; ?

## 2021-07-28 ENCOUNTER — Encounter: Payer: Self-pay | Admitting: Internal Medicine

## 2021-08-08 ENCOUNTER — Ambulatory Visit (AMBULATORY_SURGERY_CENTER): Payer: Medicare Other | Admitting: Internal Medicine

## 2021-08-08 ENCOUNTER — Encounter: Payer: Self-pay | Admitting: Internal Medicine

## 2021-08-08 VITALS — BP 117/76 | HR 68 | Temp 98.0°F | Resp 16 | Ht 62.0 in | Wt 135.0 lb

## 2021-08-08 DIAGNOSIS — D122 Benign neoplasm of ascending colon: Secondary | ICD-10-CM

## 2021-08-08 DIAGNOSIS — Z86010 Personal history of colonic polyps: Secondary | ICD-10-CM | POA: Diagnosis not present

## 2021-08-08 DIAGNOSIS — D124 Benign neoplasm of descending colon: Secondary | ICD-10-CM | POA: Diagnosis not present

## 2021-08-08 MED ORDER — SODIUM CHLORIDE 0.9 % IV SOLN: 500.0000 mL | Freq: Once | INTRAVENOUS | Status: DC

## 2021-08-08 NOTE — Progress Notes (Signed)
Pt's states no medical or surgical changes since previsit or office visit. ? ?Pt's daughter Salli Real interpreted for today's visit.  ? ? ? ?

## 2021-08-08 NOTE — Patient Instructions (Signed)
YOU HAD AN ENDOSCOPIC PROCEDURE TODAY AT THE [facility]:   Refer to the procedure report that was given to you for any specific questions about what was found during the examination.  If the procedure report does not answer your questions, please call your gastroenterologist to clarify.  If you requested that your care partner not be given the details of your procedure findings, then the procedure report has been included in a sealed envelope for you to review at your convenience later.  YOU SHOULD EXPECT: Some feelings of bloating in the abdomen. Passage of more gas than usual.  Walking can help get rid of the air that was put into your GI tract during the procedure and reduce the bloating. If you had a lower endoscopy (such as a colonoscopy or flexible sigmoidoscopy) you may notice spotting of blood in your stool or on the toilet paper. If you underwent a bowel prep for your procedure, you may not have a normal bowel movement for a few days.  Please Note:  You might notice some irritation and congestion in your nose or some drainage.  This is from the oxygen used during your procedure.  There is no need for concern and it should clear up in a day or so.  SYMPTOMS TO REPORT IMMEDIATELY:   Following lower endoscopy (colonoscopy or flexible sigmoidoscopy):  Excessive amounts of blood in the stool  Significant tenderness or worsening of abdominal pains  Swelling of the abdomen that is new, acute  Fever of 100F or higher  For urgent or emergent issues, a gastroenterologist can be reached at any hour by calling [phone]. Do not use MyChart messaging for urgent concerns.    DIET:  We do recommend a small meal at first, but then you may proceed to your regular diet.  Drink plenty of fluids but you should avoid alcoholic beverages for 24 hours.  ACTIVITY:  You should plan to take it easy for the rest of today and you should NOT DRIVE or use heavy machinery until tomorrow (because  of the sedation medicines used during the test).    FOLLOW UP: Our staff will call the number listed on your records 48-72 hours following your procedure to check on you and address any questions or concerns that you may have regarding the information given to you following your procedure. If we do not reach you, we will leave a message.  We will attempt to reach you two times.  During this call, we will ask if you have developed any symptoms of COVID 19. If you develop any symptoms (ie: fever, flu-like symptoms, shortness of breath, cough etc.) before then, please call [phone].  If you test positive for Covid 19 in the 2 weeks post procedure, please call and report this information to us.    If any biopsies were taken you will be contacted by phone or by letter within the next 1-3 weeks.  Please call us at [phone] if you have not heard about the biopsies in 3 weeks.    SIGNATURES/CONFIDENTIALITY: You and/or your care partner have signed paperwork which will be entered into your electronic medical record.  These signatures attest to the fact that that the information above on your After Visit Summary has been reviewed and is understood.  Full responsibility of the confidentiality of this discharge information lies with you and/or your care-partner. 

## 2021-08-08 NOTE — Progress Notes (Signed)
Called to room to assist during endoscopic procedure.  Patient ID and intended procedure confirmed with present staff. Received instructions for my participation in the procedure from the performing physician.  

## 2021-08-08 NOTE — Progress Notes (Signed)
Report to PACU, RN, vss, BBS= Clear.  

## 2021-08-08 NOTE — Op Note (Signed)
[place] ?Patient Name: Katherine Kelly ?Procedure Date: 08/08/2021 4:16 PM ?MRN: [id number] ?Endoscopist: Docia Chuck. Henrene Pastor , MD ?Age: 66 ?Referring MD:  ?Date of Birth: 13-Feb-1956 ?Gender: Female ?Account #: [id number] ?Procedure:                Colonoscopy with cold snare polypectomy x 2 ?Indications:              High risk colon cancer surveillance: Personal  ?                          history of multiple adenomas. Previous examinations  ?                          2008, 2014 ?Medicines:                Monitored Anesthesia Care ?Procedure:                Pre-Anesthesia Assessment: ?                          - Prior to the procedure, a History and Physical  ?                          was performed, and patient medications and  ?                          allergies were reviewed. The patient's tolerance of  ?                          previous anesthesia was also reviewed. The risks  ?                          and benefits of the procedure and the sedation  ?                          options and risks were discussed with the patient.  ?                          All questions were answered, and informed consent  ?                          was obtained. Prior Anticoagulants: The patient has  ?                          taken no previous anticoagulant or antiplatelet  ?                          agents. ASA Grade Assessment: II - A patient with  ?                          mild systemic disease. After reviewing the risks  ?                          and benefits, the patient was deemed in  ?  satisfactory condition to undergo the procedure. ?                          After obtaining informed consent, the colonoscope  ?                          was passed under direct vision. Throughout the  ?                          procedure, the patient's blood pressure, pulse, and  ?                          oxygen saturations were monitored continuously. The  ?                          Olympus CF-[id number]  (#[id number]) Colonoscope was  ?                          introduced through the anus and advanced to the the  ?                          cecum, identified by appendiceal orifice and  ?                          ileocecal valve. The ileocecal valve, appendiceal  ?                          orifice, and rectum were photographed. The quality  ?                          of the bowel preparation was excellent. The  ?                          colonoscopy was performed without difficulty. The  ?                          patient tolerated the procedure well. The bowel  ?                          preparation used was SUPREP via split dose  ?                          instruction. ?Scope In: 4:29:09 PM ?Scope Out: 4:44:51 PM ?Scope Withdrawal Time: 0 hours 13 minutes 10 seconds  ?Total Procedure Duration: 0 hours 15 minutes 42 seconds  ?Findings:                 Two polyps were found in the descending colon and  ?                          ascending colon. The polyps were 3 to 4 mm in size.  ?                          These polyps were removed with a cold snare.  ?  Resection and retrieval were complete. ?                          Diverticula were found in the sigmoid colon and  ?                          ascending colon. ?                          The exam was otherwise without abnormality on  ?                          direct and retroflexion views. ?Complications:            No immediate complications. Estimated blood loss:  ?                          None. ?Estimated Blood Loss:     Estimated blood loss: none. ?Impression:               - Two 3 to 4 mm polyps in the descending colon and  ?                          in the ascending colon, removed with a cold snare.  ?                          Resected and retrieved. ?                          - There were diverticula in the sigmoid colon and  ?                          ascending colon. The examination was otherwise  ?                          normal on  direct and retroflexion views. ?Recommendation:           - Repeat colonoscopy in 5 years for surveillance  ?                          (history of multiple adenomatous polyps). ?                          - Patient has a contact number available for  ?                          emergencies. The signs and symptoms of potential  ?                          delayed complications were discussed with the  ?                          patient. Return to normal activities tomorrow.  ?                          Written discharge instructions were  provided to the  ?                          patient. ?                          - Resume previous diet. ?                          - Continue present medications. ?                          - Await pathology results. ?Docia Chuck. Henrene Pastor, MD ?08/08/2021 4:50:10 PM ?This report has been signed electronically. ?

## 2021-08-10 ENCOUNTER — Telehealth: Payer: Self-pay

## 2021-08-10 NOTE — Telephone Encounter (Signed)
?  Follow up Call- ? ? ?  08/08/2021  ?  3:18 PM  ?Call back number  ?Post procedure Call Back phone  # [phone] daughter-Katherine Kelly  ?Permission to leave phone message Yes  ?  ? ?Patient questions: ? ?Do you have a fever, pain , or abdominal swelling? No. ?Pain Score  0 * ? ?Have you tolerated food without any problems? Yes.   ? ?Have you been able to return to your normal activities? Yes.   ? ?Do you have any questions about your discharge instructions: ?Diet   No. ?Medications  No. ?Follow up visit  No. ? ?Do you have questions or concerns about your Care? No. ? ?Spoke with patient's daughter as patient wasn't available to speak with me. ? ?Actions: ?* If pain score is 4 or above: ?No action needed, pain <4. ? ? ?

## 2021-08-10 NOTE — Telephone Encounter (Signed)
Attempted to reach patient for post-procedure f/u call. No answer Left Message. ?

## 2021-08-11 ENCOUNTER — Encounter: Payer: Self-pay | Admitting: Internal Medicine

## 2021-09-18 ENCOUNTER — Telehealth: Payer: Self-pay | Admitting: Family

## 2021-09-18 ENCOUNTER — Other Ambulatory Visit: Payer: Self-pay

## 2021-09-18 DIAGNOSIS — E1129 Type 2 diabetes mellitus with other diabetic kidney complication: Secondary | ICD-10-CM

## 2021-09-18 MED ORDER — LOSARTAN POTASSIUM-HCTZ 100-25 MG PO TABS: 1.0000 | ORAL_TABLET | Freq: Every day | ORAL | 0 refills | Status: DC

## 2021-09-18 NOTE — Telephone Encounter (Signed)
Pharmacy advised to contact patient as soon as rx ready

## 2021-09-18 NOTE — Telephone Encounter (Signed)
Rx sent 

## 2021-09-18 NOTE — Telephone Encounter (Signed)
Pt is hoping to get enough medication to last until her appt with [facility] 6/20. Please advise.   losartan-hydrochlorothiazide (HYZAAR) 100-25 MG tablet   [place], [address], [place] [address]  Phone:  [phone]  Fax:  [phone]

## 2021-10-01 IMAGING — DX DG RIBS W/ CHEST 3+V*R*
3 series · 3 of 3 positions shown · non-contrast
Comparison: Chest radiograph October 11, 2009

CLINICAL DATA: Pain following injury several weeks prior

EXAM:
RIGHT RIBS AND CHEST - 3+ VIEW

[chest pa]
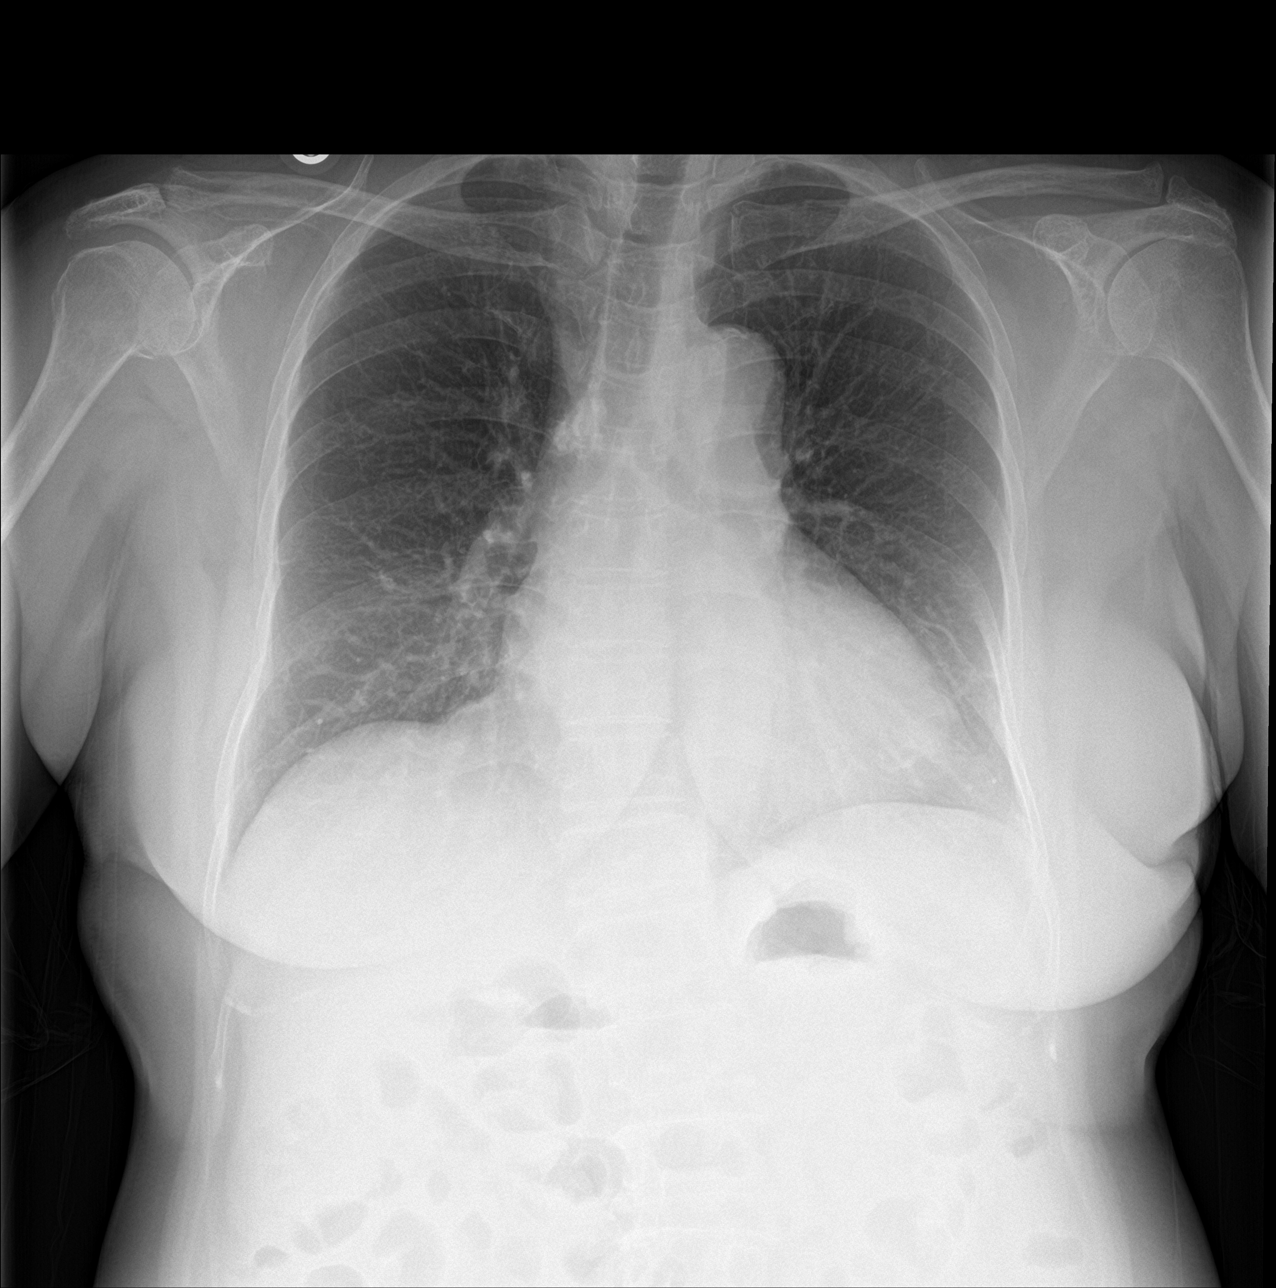

[rib pa]
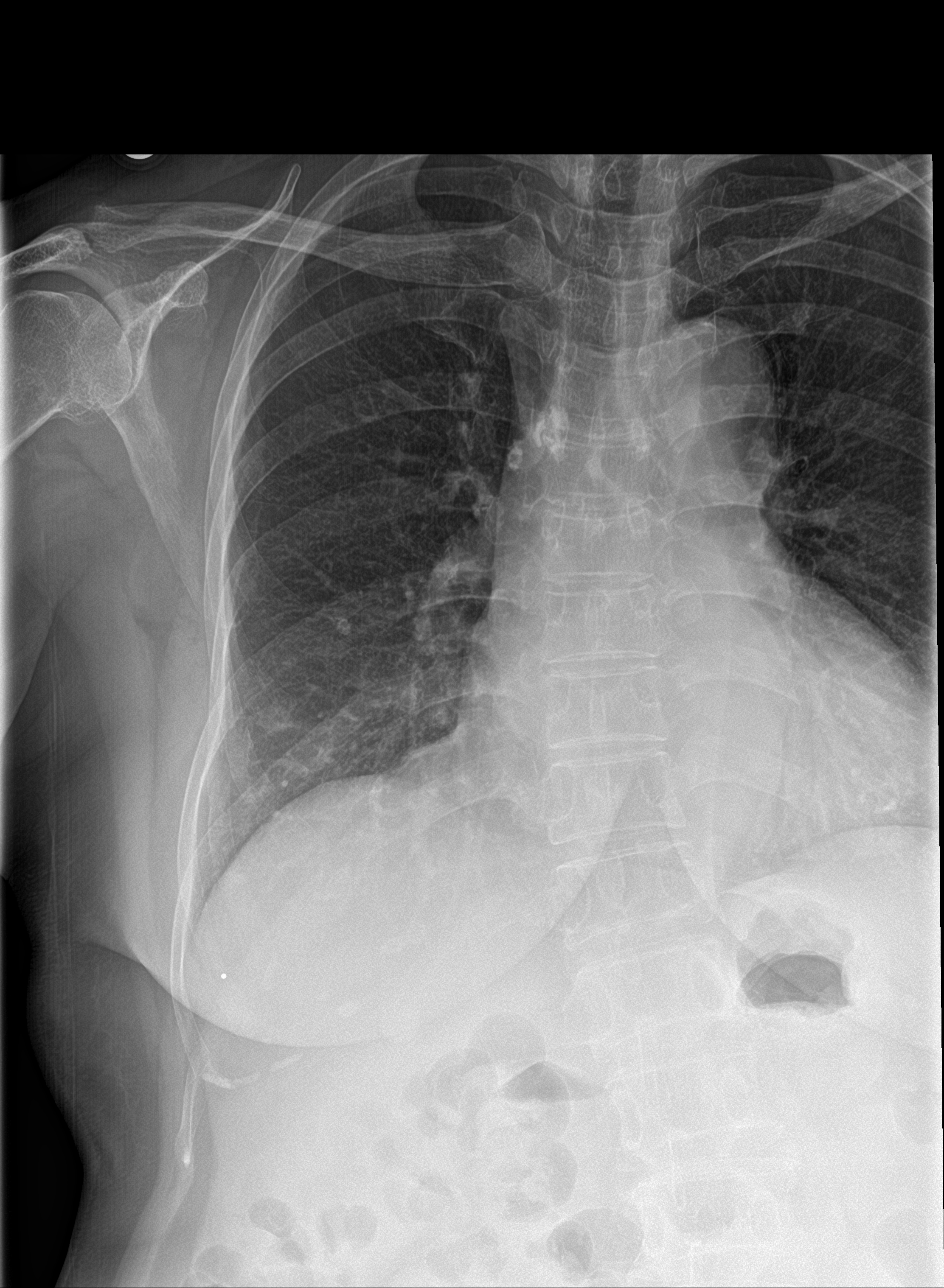

[rib pa obl]
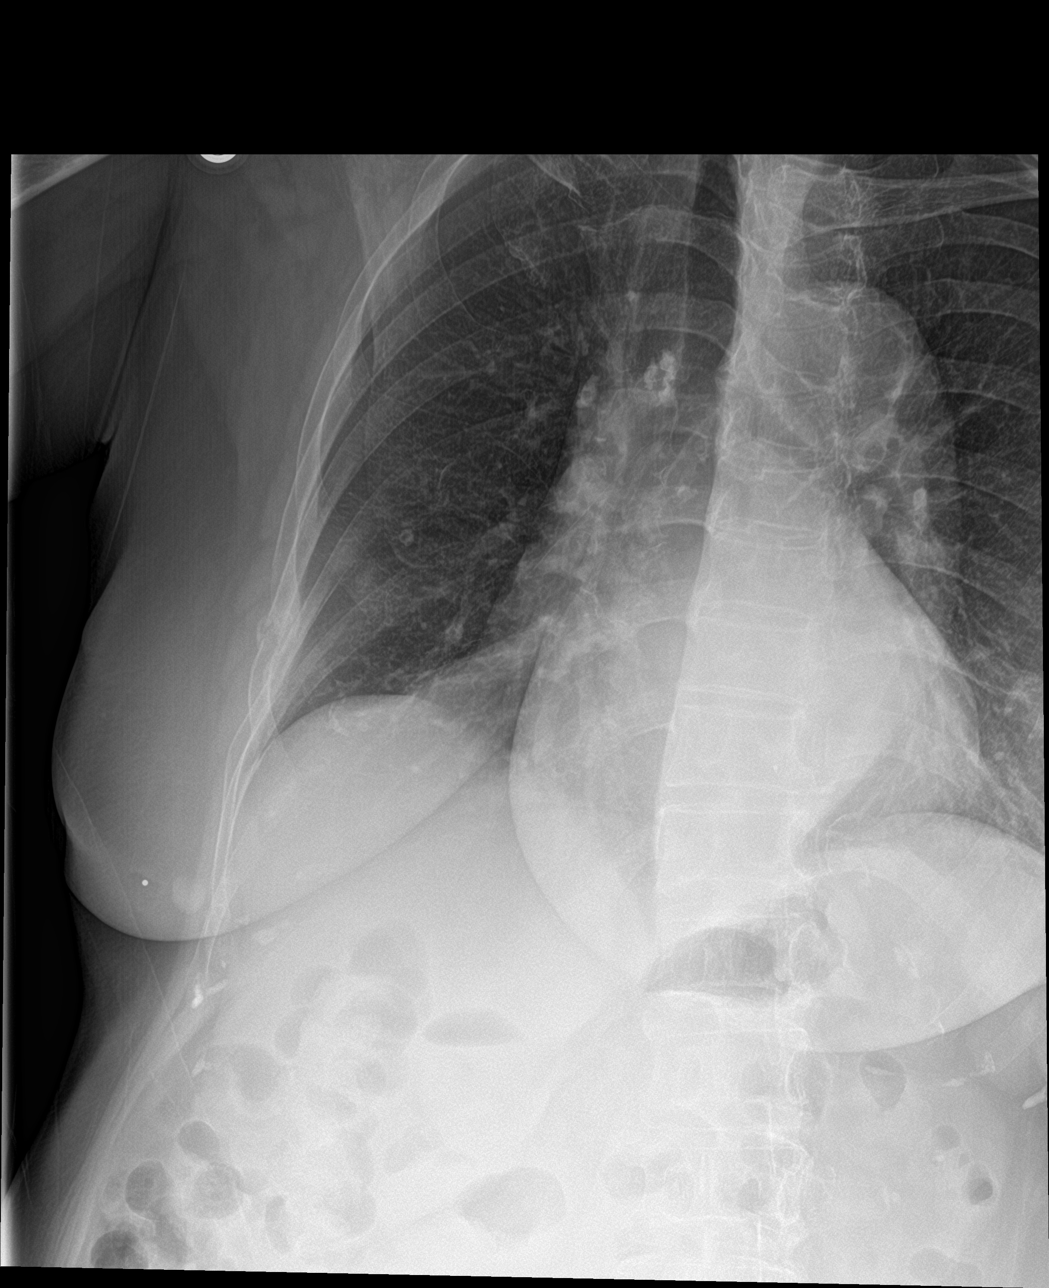

[3 of 3 positions shown; findings below may reference images not displayed]

FINDINGS: Frontal chest as well as oblique and cone-down rib images were
obtained. No edema or consolidation. Heart is borderline enlarged
with pulmonary vascularity normal. There are calcified lymph nodes
in the right axillary region, stable. No adenopathy evident.

There is a fracture of the anterior right sixth rib which shows
healing response suggesting subacute fracture. There is a
nondisplaced fracture of the anterolateral right seventh rib. No
pneumothorax or pleural effusion evident.
IMPRESSION: Subacute appearing fracture of the anterior right sixth rib.
Nondisplaced fracture anterolateral right seventh rib. No
pneumothorax. No edema or consolidation. Mild cardiac enlargement,
stable.

These results will be called to the ordering clinician or
representative by the Radiologist Assistant, and communication
documented in the PACS or zVision Dashboard.

## 2021-10-03 ENCOUNTER — Ambulatory Visit (INDEPENDENT_AMBULATORY_CARE_PROVIDER_SITE_OTHER): Payer: Medicare Other | Admitting: Family

## 2021-10-03 ENCOUNTER — Other Ambulatory Visit (HOSPITAL_BASED_OUTPATIENT_CLINIC_OR_DEPARTMENT_OTHER): Payer: Self-pay

## 2021-10-03 VITALS — BP 132/80 | HR 76 | Temp 98.3°F | Resp 16 | Wt 139.6 lb

## 2021-10-03 DIAGNOSIS — Z23 Encounter for immunization: Secondary | ICD-10-CM | POA: Diagnosis not present

## 2021-10-03 DIAGNOSIS — E1129 Type 2 diabetes mellitus with other diabetic kidney complication: Secondary | ICD-10-CM | POA: Diagnosis not present

## 2021-10-03 DIAGNOSIS — I10 Essential (primary) hypertension: Secondary | ICD-10-CM | POA: Diagnosis not present

## 2021-10-03 DIAGNOSIS — E785 Hyperlipidemia, unspecified: Secondary | ICD-10-CM | POA: Diagnosis not present

## 2021-10-03 DIAGNOSIS — R809 Proteinuria, unspecified: Secondary | ICD-10-CM

## 2021-10-03 LAB — BASIC METABOLIC PANEL
BUN: 22 mg/dL (ref 6–23)
CO2: 26 mEq/L (ref 19–32)
Calcium: 10.7 mg/dL — ABNORMAL HIGH (ref 8.4–10.5)
Chloride: 100 mEq/L (ref 96–112)
Creatinine, Ser: 0.99 mg/dL (ref 0.40–1.20)
GFR: 59.84 mL/min — ABNORMAL LOW (ref >60.00)
Glucose, Bld: 123 mg/dL — ABNORMAL HIGH (ref 70–99)
Potassium: 4 mEq/L (ref 3.5–5.1)
Sodium: 137 mEq/L (ref 135–145)

## 2021-10-03 MED ORDER — METFORMIN HCL 500 MG PO TABS
500.0000 mg | ORAL_TABLET | Freq: Two times a day (BID) | ORAL | 1 refills | Status: DC
  Filled 2021-10-03: qty 180, 90d supply, fill #0

## 2021-10-03 MED ORDER — LOSARTAN POTASSIUM-HCTZ 100-25 MG PO TABS
1.0000 | ORAL_TABLET | Freq: Every day | ORAL | 1 refills | Status: DC
  Filled 2021-10-03: qty 90, 90d supply, fill #0

## 2021-10-03 MED ORDER — FENOFIBRATE 160 MG PO TABS
ORAL_TABLET | ORAL | 1 refills | Status: DC
  Filled 2021-10-03: qty 90, 90d supply, fill #0

## 2021-10-03 NOTE — Assessment & Plan Note (Signed)
BP Readings from Last 3 Encounters:  10/03/21 132/80  08/08/21 117/76  07/03/21 (!) 154/81   At goal on hyzaar. Continue same.

## 2021-10-03 NOTE — Assessment & Plan Note (Signed)
Lab Results  Component Value Date   HGBA1C 6.9 (H) 06/02/2021   HGBA1C 7.2 (H) 11/14/2020   HGBA1C 7.0 (H) 07/18/2020   Lab Results  Component Value Date   MICROALBUR 2.3 (H) 12/14/2014   LDLCALC 53 06/02/2021   CREATININE 0.87 07/03/2021   a1c at goal last visit. Continue metformin. Repeat A1C.

## 2021-10-03 NOTE — Patient Instructions (Addendum)
Please get your second shingles shot (shingrix) at the pharmacy as well as tetanus shot.  Please consider getting starting the covid shot series at the pharmacy. Complete lab work prior to leaving.

## 2021-10-03 NOTE — Progress Notes (Addendum)
Subjective:   By signing my name below, I, [name], attest that this documentation has been prepared under the direction and in the presence of [name], 10/03/2021     Patient ID: Katherine Kelly, female    DOB: [birth year], 66 y.o.   MRN: [id number]  Chief Complaint  Patient presents with   Hypertension    Here for follow up   Diabetes    Here for follow up    HPI Patient is in today for an office visit. She is accompanied by her daughter.   Refill: She is requesting a refill of 160 Mg of Fenofibrate,100-25 Mg of Losartan-Hydrochlorothiazide, and 500 Mg of Metformin.   Blood Sugar: Her A1C levels are improving. She is currently taking 500 Mg of Metformin.  Lab Results  Component Value Date   HGBA1C 6.9 (H) 06/02/2021   Cholesterol: Her cholesterol levels are normal. She is currently taking 10Mg of Simvastatin and 160 Mg of Fenofibrate.  Lab Results  Component Value Date   CHOL 132 06/02/2021   HDL 58 06/02/2021   LDLCALC 53 06/02/2021   LDLDIRECT 64.9 12/17/2006   TRIG 130 06/02/2021   CHOLHDL 2.3 06/02/2021   Blood Pressure: Her blood pressure levels are normal. She is currently taking 100-25 Mg of Losartan-Hydrochlorothiazide. BP Readings from Last 3 Encounters:  10/03/21 132/80  08/08/21 117/76  07/03/21 (!) 154/81   Pulse Readings from Last 3 Encounters:  10/03/21 76  08/08/21 68  07/03/21 79   Immunizations: She has received one Shingles vaccine. She is due for a tetanus vaccine. She is interested in receiving the Pneumonia vaccine. She has not received any of the Covid-19 vaccines.   Health Maintenance Due  Topic Date Due   OPHTHALMOLOGY EXAM  01/12/2015   TETANUS/TDAP  12/21/2020   Zoster Vaccines- Shingrix (2 of 2) 12/27/2020   DEXA SCAN  Never done    Past Medical History:  Diagnosis Date   ACE-inhibitor cough    ALLERGIC RHINITIS 01/03/2009   Qualifier: Diagnosis of  By: Wynona Luna    Aortic valve stenosis 08/30/2020    Arthritis    hands   Cataract, bilateral    COVID-19 vaccine series declined 11/01/2020   Diabetes mellitus, type 2 ([place]) 09/2006   on meds   Diabetes type 2, controlled ([place]) 11/27/2006   Qualifier: Diagnosis of  By: Loanne Drilling MD, Sean A    Diabetic neuropathy ([place]) 08/31/2013   on meds   Eczema 09/14/2014   Essential hypertension 11/27/2006   Qualifier: Diagnosis of  By: Loanne Drilling MD, Hilliard Clark A    History of colonic polyps    Hypertension    on meds   RHINOSINUSITIS, CHRONIC 07/07/2009   Qualifier: Diagnosis of  By: Wynona Luna     Past Surgical History:  Procedure Laterality Date   BELPHAROPTOSIS REPAIR Bilateral    BREAST BIOPSY Left    stereo tatic   CARDIOVASCULAR STRESS TEST  11/08/2006   CESAREAN SECTION  1980 & 1982   COLONOSCOPY  2014   JP-MAC-mov(good)-TA   ESOPHAGOGASTRODUODENOSCOPY  04/2006   POLYPECTOMY  2014   TA    Family History  Problem Relation Age of Onset   Colon polyps Sister 68   Colon polyps Brother 32   Colon cancer Neg Hx    Rectal cancer Neg Hx    Stomach cancer Neg Hx    Esophageal cancer Neg Hx     Social History   Socioeconomic History  Marital status: Married    Spouse name: Not on file   Number of children: Not on file   Years of education: Not on file   Highest education level: Not on file  Occupational History   Not on file  Tobacco Use   Smoking status: Never   Smokeless tobacco: Never  Vaping Use   Vaping Use: Never used  Substance and Sexual Activity   Alcohol use: No   Drug use: No   Sexual activity: Not on file  Other Topics Concern   Not on file  Social History Narrative   Lives with husband and daughter   Freight forwarder at [employer]   No pets   Did not complete high school   Enjoys travel and shopping   Social Determinants of Health   Financial Resource Strain: Not on file  Food Insecurity: Not on file  Transportation Needs: Not on file  Physical Activity: Not on file  Stress: Not on file  Social  Connections: Not on file  Intimate Partner Violence: Not on file    Outpatient Medications Prior to Visit  Medication Sig Dispense Refill   betamethasone dipropionate (DIPROLENE) 0.05 % cream Apply 1 application topically daily as needed. 30 g 1   cholecalciferol (VITAMIN D) 1000 UNITS tablet Take 1,000 Units by mouth daily. Reported on 10/25/2015     Clobetasol Prop Emollient Base 0.05 % emollient cream Apply 1 application. topically 2 (two) times daily as needed. 30 g 1   simvastatin (ZOCOR) 10 MG tablet TAKE 1 TABLET(10 MG) BY MOUTH AT BEDTIME 90 tablet 2   amoxicillin (AMOXIL) 500 MG tablet Take 500 mg by mouth 3 (three) times daily. For dental work     fenofibrate 160 MG tablet TAKE 1 TABLET(160 MG) BY MOUTH DAILY 90 tablet 1   losartan-hydrochlorothiazide (HYZAAR) 100-25 MG tablet Take 1 tablet by mouth daily. 90 tablet 0   metFORMIN (GLUCOPHAGE) 500 MG tablet Take 1 tablet (500 mg total) by mouth 2 (two) times daily with a meal. 180 tablet 1   No facility-administered medications prior to visit.    Allergies  Allergen Reactions   Aspirin Other (See Comments)    unknown   Ace Inhibitors     weakness    ROS See HPI    Objective:    Physical Exam Constitutional:      General: She is not in acute distress.    Appearance: Normal appearance. She is not ill-appearing.  HENT:     Head: Normocephalic and atraumatic.     Right Ear: External ear normal.     Left Ear: External ear normal.  Eyes:     Extraocular Movements: Extraocular movements intact.     Pupils: Pupils are equal, round, and reactive to light.  Cardiovascular:     Rate and Rhythm: Normal rate and regular rhythm.     Heart sounds: Normal heart sounds. No murmur heard.    No gallop.  Pulmonary:     Effort: Pulmonary effort is normal. No respiratory distress.     Breath sounds: Normal breath sounds. No wheezing or rales.  Skin:    General: Skin is warm and dry.  Neurological:     Mental Status: She is alert  and oriented to person, place, and time.  Psychiatric:        Mood and Affect: Mood normal.        Behavior: Behavior normal.        Judgment: Judgment normal.  BP 132/80 (BP Location: Right Arm, Patient Position: Sitting, Cuff Size: Small)   Pulse 76   Temp 98.3 F (36.8 C) (Oral)   Resp 16   Wt 139 lb 9.6 oz (63.3 kg)   SpO2 98%   BMI 25.53 kg/m  Wt Readings from Last 3 Encounters:  10/03/21 139 lb 9.6 oz (63.3 kg)  08/08/21 135 lb (61.2 kg)  07/25/21 135 lb (61.2 kg)       Assessment & Plan:   Problem List Items Addressed This Visit       Unprioritized   Essential hypertension    BP Readings from Last 3 Encounters:  10/03/21 132/80  08/08/21 117/76  07/03/21 (!) 154/81  At goal on hyzaar. Continue same.       Relevant Medications   losartan-hydrochlorothiazide (HYZAAR) 100-25 MG tablet   fenofibrate 160 MG tablet   Other Relevant Orders   Basic metabolic panel   Dyslipidemia    Lab Results  Component Value Date   CHOL 132 06/02/2021   HDL 58 06/02/2021   LDLCALC 53 06/02/2021   LDLDIRECT 64.9 12/17/2006   TRIG 130 06/02/2021   CHOLHDL 2.3 06/02/2021  At goal, continues simvastatin 5m and fenofibrate 1639m       Relevant Medications   fenofibrate 160 MG tablet   Diabetes type 2, controlled ([facility]   Lab Results  Component Value Date   HGBA1C 6.9 (H) 06/02/2021   HGBA1C 7.2 (H) 11/14/2020   HGBA1C 7.0 (H) 07/18/2020   Lab Results  Component Value Date   MICROALBUR 2.3 (H) 12/14/2014   LDLCALC 53 06/02/2021   CREATININE 0.87 07/03/2021  a1c at goal last visit. Continue metformin. Repeat A1C.       Relevant Medications   losartan-hydrochlorothiazide (HYZAAR) 100-25 MG tablet   metFORMIN (GLUCOPHAGE) 500 MG tablet   Other Visit Diagnoses     Need for pneumococcal 20-valent conjugate vaccination    -  Primary   Relevant Orders   Pneumococcal conjugate vaccine 20-valent (Prevnar 20) (Completed)      Prevnar 20 today.   Meds  ordered this encounter  Medications   losartan-hydrochlorothiazide (HYZAAR) 100-25 MG tablet    Sig: Take 1 tablet by mouth daily.    Dispense:  90 tablet    Refill:  1    Please call patient as soon as ready for pick up. thanks    Order Specific Question:   Supervising Provider    Answer:   BLPenni Homans [[id number]]   fenofibrate 160 MG tablet    Sig: TAKE 1 TABLET (160 MG) BY MOUTH DAILY    Dispense:  90 tablet    Refill:  1    Order Specific Question:   Supervising Provider    Answer:   BLPenni Homans [[id number]]   metFORMIN (GLUCOPHAGE) 500 MG tablet    Sig: Take 1 tablet (500 mg total) by mouth 2 (two) times daily with a meal.    Dispense:  180 tablet    Refill:  1    Order Specific Question:   Supervising Provider    Answer:   BLPenni Homans [[id number]]   Prevnar 20 today.   I, MeNance PearNP, personally preformed the services described in this documentation.  All medical record entries made by the scribe were at my direction and in my presence.  I have reviewed the chart and discharge instructions (if applicable) and agree that the record reflects my personal performance and is accurate and complete.  10/03/2021   I,Amber Collins,acting as a scribe for [name], NP.,have documented all relevant documentation on the behalf of HURLEY BLEVINS, NP,as directed by  [name], NP while in the presence of [name], NP.  [name], NP

## 2021-10-03 NOTE — Assessment & Plan Note (Addendum)
Lab Results  Component Value Date   CHOL 132 06/02/2021   HDL 58 06/02/2021   LDLCALC 53 06/02/2021   LDLDIRECT 64.9 12/17/2006   TRIG 130 06/02/2021   CHOLHDL 2.3 06/02/2021   At goal, continues simvastatin '10mg'$  and fenofibrate '160mg'$ .

## 2021-10-31 ENCOUNTER — Other Ambulatory Visit: Payer: Self-pay | Admitting: Family

## 2021-11-15 ENCOUNTER — Telehealth: Payer: Self-pay | Admitting: Family

## 2021-11-15 ENCOUNTER — Other Ambulatory Visit: Payer: Self-pay

## 2021-11-15 DIAGNOSIS — E1129 Type 2 diabetes mellitus with other diabetic kidney complication: Secondary | ICD-10-CM

## 2021-11-15 MED ORDER — LOSARTAN POTASSIUM-HCTZ 100-25 MG PO TABS: 1.0000 | ORAL_TABLET | Freq: Every day | ORAL | 1 refills | Status: DC

## 2021-11-15 NOTE — Telephone Encounter (Signed)
Rx sent to [facility] at patient's request

## 2021-11-15 NOTE — Telephone Encounter (Signed)
Medication: [id number]  losartan-hydrochlorothiazide (HYZAAR) 100-25 MG tablet  Has the patient contacted their pharmacy? Yes.    Preferred Pharmacy (with phone number or street name): [facility], [address], [place] [address]  Phone:  [phone]  Fax:  [phone]   Agent: Please be advised that RX refills may take up to 3 business days. We ask that you follow-up with your pharmacy.

## 2021-11-27 ENCOUNTER — Other Ambulatory Visit: Payer: Self-pay | Admitting: Family

## 2021-12-26 ENCOUNTER — Other Ambulatory Visit: Payer: Self-pay | Admitting: Family

## 2022-01-18 ENCOUNTER — Other Ambulatory Visit: Payer: Self-pay

## 2022-01-18 MED ORDER — SIMVASTATIN 10 MG PO TABS: 10.0000 mg | ORAL_TABLET | Freq: Every day | ORAL | 0 refills | Status: DC

## 2022-02-02 ENCOUNTER — Telehealth: Payer: Self-pay | Admitting: Family

## 2022-02-02 ENCOUNTER — Ambulatory Visit (INDEPENDENT_AMBULATORY_CARE_PROVIDER_SITE_OTHER): Payer: Medicare Other | Admitting: Family

## 2022-02-02 VITALS — BP 148/83 | HR 96 | Temp 98.2°F | Resp 16 | Wt 141.0 lb

## 2022-02-02 DIAGNOSIS — Z13820 Encounter for screening for osteoporosis: Secondary | ICD-10-CM | POA: Diagnosis not present

## 2022-02-02 DIAGNOSIS — E785 Hyperlipidemia, unspecified: Secondary | ICD-10-CM | POA: Diagnosis not present

## 2022-02-02 DIAGNOSIS — R809 Proteinuria, unspecified: Secondary | ICD-10-CM

## 2022-02-02 DIAGNOSIS — I10 Essential (primary) hypertension: Secondary | ICD-10-CM | POA: Diagnosis not present

## 2022-02-02 DIAGNOSIS — E348 Other specified endocrine disorders: Secondary | ICD-10-CM

## 2022-02-02 DIAGNOSIS — E1129 Type 2 diabetes mellitus with other diabetic kidney complication: Secondary | ICD-10-CM

## 2022-02-02 DIAGNOSIS — Z87898 Personal history of other specified conditions: Secondary | ICD-10-CM

## 2022-02-02 HISTORY — DX: Personal history of other specified conditions: Z87.898

## 2022-02-02 LAB — COMPREHENSIVE METABOLIC PANEL
ALT: 21 U/L (ref 0–35)
AST: 25 U/L (ref 0–37)
Albumin: 4.8 g/dL (ref 3.5–5.2)
Alkaline Phosphatase: 38 U/L — ABNORMAL LOW (ref 39–117)
BUN: 24 mg/dL — ABNORMAL HIGH (ref 6–23)
CO2: 26 mEq/L (ref 19–32)
Calcium: 10.3 mg/dL (ref 8.4–10.5)
Chloride: 101 mEq/L (ref 96–112)
Creatinine, Ser: 1.02 mg/dL (ref 0.40–1.20)
GFR: 57.6 mL/min — ABNORMAL LOW (ref >60.00)
Glucose, Bld: 95 mg/dL (ref 70–99)
Potassium: 4.2 mEq/L (ref 3.5–5.1)
Sodium: 138 mEq/L (ref 135–145)
Total Bilirubin: 0.7 mg/dL (ref 0.2–1.2)
Total Protein: 7.7 g/dL (ref 6.0–8.3)

## 2022-02-02 LAB — HEMOGLOBIN A1C: Hgb A1c MFr Bld: 7.8 % — ABNORMAL HIGH (ref 4.6–6.5)

## 2022-02-02 LAB — LIPID PANEL
Cholesterol: 125 mg/dL (ref 0–200)
HDL: 53.3 mg/dL (ref >39.00)
LDL Cholesterol: 45 mg/dL (ref 0–99)
NonHDL: 72.16
Total CHOL/HDL Ratio: 2
Triglycerides: 135 mg/dL (ref 0.0–149.0)
VLDL: 27 mg/dL (ref 0.0–40.0)

## 2022-02-02 LAB — MICROALBUMIN / CREATININE URINE RATIO
Creatinine,U: 67 mg/dL
Microalb Creat Ratio: 1.2 mg/g (ref 0.0–30.0)
Microalb, Ur: 0.8 mg/dL (ref 0.0–1.9)

## 2022-02-02 MED ORDER — METFORMIN HCL 500 MG PO TABS: 500.0000 mg | ORAL_TABLET | Freq: Two times a day (BID) | ORAL | 1 refills | Status: DC

## 2022-02-02 MED ORDER — SCOPOLAMINE 1 MG/3DAYS TD PT72: 1.0000 | MEDICATED_PATCH | TRANSDERMAL | 0 refills | Status: DC

## 2022-02-02 NOTE — Telephone Encounter (Signed)
Please request DM eye reportreport from [facility].

## 2022-02-02 NOTE — Assessment & Plan Note (Signed)
Maintained on simvastatin. Continue same.

## 2022-02-02 NOTE — Telephone Encounter (Signed)
Request faxed

## 2022-02-02 NOTE — Assessment & Plan Note (Signed)
rx sent for scopolamine patch for her upcoming cruise.

## 2022-02-02 NOTE — Assessment & Plan Note (Addendum)
BP Readings from Last 3 Encounters:  02/02/22 (!) 148/83  10/03/21 132/80  08/08/21 117/76   Reports good compliance with losartan hctz. Has not taken med yet today. I asked daughter to repeat her bp this afternoon at home and send me her reading via mychart.

## 2022-02-02 NOTE — Progress Notes (Signed)
Subjective:   By signing my name below, I, [name], attest that this documentation has been prepared under the direction and in the presence of Karie Chimera, NP 02/02/2022    Patient ID: Katherine Kelly, female    DOB: [birth year], 66 y.o.   MRN: [id number]  Chief Complaint  Patient presents with   Diabetes    Here for follow up   Hypertension    Here for follow up    HPI Patient is in today for an office visit. She is accompanied by her daughter   Refill: She is requesting a refill of her 500 Mg of Metformin medication  Blood Pressure: As of today's visit, her blood pressure is elevating. Her blood pressure reading is 128/95. She is currently taking 100-25 Mg of Hyzaar. She has not taken her medication before today's visit and usually takes it around lunch time.  BP Readings from Last 3 Encounters:  02/02/22 (!) 148/83  10/03/21 132/80  08/08/21 117/76   Pulse Readings from Last 3 Encounters:  02/02/22 96  10/03/21 76  08/08/21 68   Blood Sugar: She regularly checks her blood sugars at home. She states a range of 130-150 mg/dL. She states that she is keeping up with her diet.  Lab Results  Component Value Date   HGBA1C 6.9 (H) 06/02/2021   Cholesterol: She is continuing to take 10 Mg of Simvastatin Lab Results  Component Value Date   CHOL 132 06/02/2021   HDL 58 06/02/2021   LDLCALC 53 06/02/2021   LDLDIRECT 64.9 12/17/2006   TRIG 130 06/02/2021   CHOLHDL 2.3 06/02/2021   Dexa: She is due for a Dexa scan. She is okay with being referred for one.   Immunizations: She is not interested in receiving an influenza vaccine at this moment  Vision: She states that her last vision exam was last year  Sea-Sickness: She is going on a cruise and is requesting a medication to combat sea-sickness  Health Maintenance Due  Topic Date Due   OPHTHALMOLOGY EXAM  01/12/2015   Diabetic kidney evaluation - Urine ACR  12/14/2015   TETANUS/TDAP  12/21/2020   Zoster  Vaccines- Shingrix (2 of 2) 12/27/2020   DEXA SCAN  Never done   HEMOGLOBIN A1C  11/30/2021    Past Medical History:  Diagnosis Date   ACE-inhibitor cough    ALLERGIC RHINITIS 01/03/2009   Qualifier: Diagnosis of  By: Wynona Luna    Aortic valve stenosis 08/30/2020   Arthritis    hands   Cataract, bilateral    COVID-19 vaccine series declined 11/01/2020   Diabetes mellitus, type 2 ([place]) 09/2006   on meds   Diabetes type 2, controlled ([place]) 11/27/2006   Qualifier: Diagnosis of  By: Loanne Drilling MD, Sean A    Diabetic neuropathy ([place]) 08/31/2013   on meds   Eczema 09/14/2014   Essential hypertension 11/27/2006   Qualifier: Diagnosis of  By: Loanne Drilling MD, Hilliard Clark A    History of colonic polyps    Hypertension    on meds   RHINOSINUSITIS, CHRONIC 07/07/2009   Qualifier: Diagnosis of  By: Wynona Luna     Past Surgical History:  Procedure Laterality Date   BELPHAROPTOSIS REPAIR Bilateral    BREAST BIOPSY Left    stereo tatic   CARDIOVASCULAR STRESS TEST  11/08/2006   CESAREAN [place]   COLONOSCOPY  2014   JP-MAC-mov(good)-TA   ESOPHAGOGASTRODUODENOSCOPY  04/2006   POLYPECTOMY  2014   TA    Family History  Problem Relation Age of Onset   Colon polyps Sister 62   Colon polyps Brother 66   Colon cancer Neg Hx    Rectal cancer Neg Hx    Stomach cancer Neg Hx    Esophageal cancer Neg Hx     Social History   Socioeconomic History   Marital status: Married    Spouse name: Not on file   Number of children: Not on file   Years of education: Not on file   Highest education level: Not on file  Occupational History   Not on file  Tobacco Use   Smoking status: Never   Smokeless tobacco: Never  Vaping Use   Vaping Use: Never used  Substance and Sexual Activity   Alcohol use: No   Drug use: No   Sexual activity: Not on file  Other Topics Concern   Not on file  Social History Narrative   Lives with husband and daughter   Freight forwarder at [employer]   No pets   Did not complete high school   Enjoys travel and shopping   Social Determinants of Health   Financial Resource Strain: Not on file  Food Insecurity: Not on file  Transportation Needs: Not on file  Physical Activity: Not on file  Stress: Not on file  Social Connections: Not on file  Intimate Partner Violence: Not on file    Outpatient Medications Prior to Visit  Medication Sig Dispense Refill   betamethasone dipropionate (DIPROLENE) 0.05 % cream Apply 1 application topically daily as needed. 30 g 1   cholecalciferol (VITAMIN D) 1000 UNITS tablet Take 1,000 Units by mouth daily. Reported on 10/25/2015     Clobetasol Prop Emollient Base 0.05 % emollient cream Apply 1 application. topically 2 (two) times daily as needed. 30 g 1   fenofibrate 160 MG tablet Take 1 tablet by mouth once daily 90 tablet 0   losartan-hydrochlorothiazide (HYZAAR) 100-25 MG tablet Take 1 tablet by mouth daily. 90 tablet 1   simvastatin (ZOCOR) 10 MG tablet Take 1 tablet (10 mg total) by mouth at bedtime. 90 tablet 0   metFORMIN (GLUCOPHAGE) 500 MG tablet TAKE 1 TABLET BY MOUTH TWICE DAILY WITH A MEAL 180 tablet 0   No facility-administered medications prior to visit.    Allergies  Allergen Reactions   Aspirin Other (See Comments)    unknown   Ace Inhibitors     weakness    ROS See HPI     Objective:    Physical Exam  BP (!) 148/83 (BP Location: Left Arm, Patient Position: Sitting, Cuff Size: Small)   Pulse 96   Temp 98.2 F (36.8 C) (Oral)   Resp 16   Wt 141 lb (64 kg)   SpO2 98%   BMI 25.79 kg/m  Wt Readings from Last 3 Encounters:  02/02/22 141 lb (64 kg)  10/03/21 139 lb 9.6 oz (63.3 kg)  08/08/21 135 lb (61.2 kg)       Assessment & Plan:   Problem List Items Addressed This Visit       Unprioritized   History of motion sickness    rx sent for scopolamine patch for her upcoming cruise.       Essential hypertension    BP Readings from Last 3 Encounters:   02/02/22 (!) 148/83  10/03/21 132/80  08/08/21 117/76  Reports good compliance with losartan hctz. Has not taken med yet today. I asked  daughter to repeat her bp this afternoon at home and send me her reading via mychart.       Dyslipidemia    Maintained on simvastatin. Continue same.       Relevant Orders   Lipid panel   Diabetes type 2, controlled ([place]) - Primary    Stable on metformin 554m bid. Check A1C.       Relevant Medications   metFORMIN (GLUCOPHAGE) 500 MG tablet   Other Relevant Orders   Hemoglobin A1c   Comp Met (CMET)   Urine Microalbumin w/creat. ratio   Other Visit Diagnoses     Screening for osteoporosis       Relevant Orders   DG Bone Density   Estradiol deficiency       Relevant Orders   DG Bone Density      Meds ordered this encounter  Medications   metFORMIN (GLUCOPHAGE) 500 MG tablet    Sig: Take 1 tablet (500 mg total) by mouth 2 (two) times daily with a meal.    Dispense:  180 tablet    Refill:  1    Order Specific Question:   Supervising Provider    Answer:   BPenni HomansA [[id number]]   scopolamine (TRANSDERM-SCOP) 1 MG/3DAYS    Sig: Place 1 patch (1.5 mg total) onto the skin every 3 (three) days.    Dispense:  3 patch    Refill:  0    Order Specific Question:   Supervising Provider    Answer:   BPenni HomansA [[id number]]    I, MNance Pear NP, personally preformed the services described in this documentation.  All medical record entries made by the scribe were at my direction and in my presence.  I have reviewed the chart and discharge instructions (if applicable) and agree that the record reflects my personal performance and is accurate and complete. 02/02/2022  I,Amber Collins,acting as a scribe for MNance Pear NP.,have documented all relevant documentation on the behalf of MTAMMIE YANDA NP,as directed by  MNance Pear NP while in the presence of MNance Pear NP.  MNance Pear NP

## 2022-02-02 NOTE — Assessment & Plan Note (Signed)
Stable on metformin '500mg'$  bid. Check A1C.

## 2022-02-05 ENCOUNTER — Telehealth: Payer: Self-pay | Admitting: Family

## 2022-02-05 ENCOUNTER — Other Ambulatory Visit (HOSPITAL_BASED_OUTPATIENT_CLINIC_OR_DEPARTMENT_OTHER): Payer: Self-pay

## 2022-02-05 MED ORDER — METFORMIN HCL 1000 MG PO TABS
1000.0000 mg | ORAL_TABLET | Freq: Two times a day (BID) | ORAL | 1 refills | Status: DC
  Filled 2022-02-05: qty 180, 90d supply, fill #0

## 2022-02-05 NOTE — Telephone Encounter (Signed)
Please advise daughter that pt's A1C has gone up. I would recommend that she increase her metformin to '1000mg'$  twice daily and continue to work on diabetic diet.  Rx sent to [facility].

## 2022-02-05 NOTE — Telephone Encounter (Signed)
Daughter notified of lab results.

## 2022-02-14 ENCOUNTER — Other Ambulatory Visit (HOSPITAL_BASED_OUTPATIENT_CLINIC_OR_DEPARTMENT_OTHER): Payer: Self-pay

## 2022-03-12 ENCOUNTER — Encounter (HOSPITAL_BASED_OUTPATIENT_CLINIC_OR_DEPARTMENT_OTHER): Payer: Self-pay

## 2022-03-12 ENCOUNTER — Telehealth: Payer: Self-pay | Admitting: Family

## 2022-03-12 ENCOUNTER — Other Ambulatory Visit: Payer: Self-pay

## 2022-03-12 ENCOUNTER — Ambulatory Visit (HOSPITAL_BASED_OUTPATIENT_CLINIC_OR_DEPARTMENT_OTHER)
Admission: RE | Admit: 2022-03-12 | Discharge: 2022-03-12 | Disposition: A | Discharge status: Home / Self Care | Payer: Medicare Other | Source: Ambulatory Visit | Attending: Family | Admitting: Family

## 2022-03-12 ENCOUNTER — Other Ambulatory Visit (HOSPITAL_BASED_OUTPATIENT_CLINIC_OR_DEPARTMENT_OTHER): Payer: Self-pay

## 2022-03-12 DIAGNOSIS — Z13820 Encounter for screening for osteoporosis: Secondary | ICD-10-CM | POA: Insufficient documentation

## 2022-03-12 DIAGNOSIS — M810 Age-related osteoporosis without current pathological fracture: Secondary | ICD-10-CM

## 2022-03-12 DIAGNOSIS — E348 Other specified endocrine disorders: Secondary | ICD-10-CM | POA: Diagnosis not present

## 2022-03-12 HISTORY — DX: Age-related osteoporosis without current pathological fracture: M81.0

## 2022-03-12 MED ORDER — TYPHOID VACCINE PO CPDR
DELAYED_RELEASE_CAPSULE | ORAL | 0 refills | Status: DC
  Filled 2022-03-12: qty 4, 30d supply, fill #0

## 2022-03-12 MED ORDER — ALENDRONATE SODIUM 70 MG PO TABS
70.0000 mg | ORAL_TABLET | ORAL | 4 refills | Status: DC
  Filled 2022-03-12: qty 12, 84d supply, fill #0

## 2022-03-12 MED ORDER — CALTRATE 600+D PLUS MINERALS 600-800 MG-UNIT PO CHEW: 1.0000 | CHEWABLE_TABLET | Freq: Two times a day (BID) | ORAL | Status: AC

## 2022-03-12 MED ORDER — ALENDRONATE SODIUM 70 MG PO TABS: 70.0000 mg | ORAL_TABLET | ORAL | 4 refills | Status: DC

## 2022-03-12 NOTE — Addendum Note (Signed)
Addended by: Debbrah Alar on: 03/12/2022 05:14 PM   Modules accepted: Orders

## 2022-03-12 NOTE — Telephone Encounter (Signed)
Bone density shows osteoporosis.  I would like for her to start fosamax '70mg'$  PO weekly in the AM. sit upright for 90 minutes after taking.   Add caltrate '600mg'$  + D bid.  Be sure to get regular exercise such as walking for bone health.    Last time we checked calcium it was high. I would like to recheck her calcium in the lab please.

## 2022-03-12 NOTE — Telephone Encounter (Signed)
All information given to patient's daughter. She was scheduled to come in for labs 03/19/2022.   Patient's daughter asking for a new prescription for Typhoid (this was given to her dad, Katherine Kelly  DOB 08/20/57, but not to patient) they are traveling later in December.

## 2022-03-13 ENCOUNTER — Other Ambulatory Visit (HOSPITAL_BASED_OUTPATIENT_CLINIC_OR_DEPARTMENT_OTHER): Payer: Self-pay

## 2022-03-13 ENCOUNTER — Telehealth: Payer: Self-pay | Admitting: *Deleted

## 2022-03-13 NOTE — Telephone Encounter (Signed)
Who Is Calling Patient / Member / Family / Caregiver Call Type Triage / Clinical Caller Name Bresha Hosack Relationship To Patient Daughter Return Phone Number [phone] (Primary) Chief Complaint Prescription Refill or Medication Request (non symptomatic) Reason for Call Medication Question / Request Initial Comment Caller wants to know if the doctor called in her mother's med was called in to the correct pharmacy. She added another med, and switched pharmacies. No Sx. Translation No Nurse Assessment Nurse: Delphina Cahill, RN, Santiago Glad Date/Time Eilene Ghazi Time): 03/12/2022 5:47:40 PM Confirm and document reason for call. If symptomatic, describe symptoms. ---Caller wants to know if the doctor called in her mother's med was it called in to the correct pharmacy? She added another med, and switched pharmacies. No Sx. Usually uses [facility]. It keeps going to med center [facility]. They called and said it's ready at [facility] One for osteoporosis and one for Typhoid prevention.  Please document clinical information provided and list any resource used. ---Instructed caller to call [facility] and have them transfer the rxs to their pharmacy from the other one. She verbalized understanding. Nurse: Delphina Cahill, RN, Santiago Glad Date/Time Eilene Ghazi Time): 03/12/2022 5:52:52 PM Please select the assessment type ---RX called in but not at [facility] the name of the medication. ---Osteoporosis and Typhoid prevention meds Pharmacy name and phone number. ---[place]  Final Disposition 03/12/2022 5:54:54 PM Clinical Call Yes Delphina Cahill, RN, Santiago Glad

## 2022-03-13 NOTE — Telephone Encounter (Signed)
Opened in error

## 2022-03-19 ENCOUNTER — Other Ambulatory Visit (INDEPENDENT_AMBULATORY_CARE_PROVIDER_SITE_OTHER): Payer: Medicare Other

## 2022-03-19 DIAGNOSIS — M810 Age-related osteoporosis without current pathological fracture: Secondary | ICD-10-CM | POA: Diagnosis not present

## 2022-03-20 LAB — PTH, INTACT AND CALCIUM
Calcium: 10.1 mg/dL (ref 8.6–10.4)
PTH: 18 pg/mL (ref 16–77)

## 2022-03-20 LAB — VITAMIN D 25 HYDROXY (VIT D DEFICIENCY, FRACTURES): VITD: 32.04 ng/mL (ref 30.00–100.00)

## 2022-03-22 ENCOUNTER — Other Ambulatory Visit: Payer: Self-pay | Admitting: Family

## 2022-03-27 DIAGNOSIS — H5203 Hypermetropia, bilateral: Secondary | ICD-10-CM | POA: Diagnosis not present

## 2022-03-27 DIAGNOSIS — H2513 Age-related nuclear cataract, bilateral: Secondary | ICD-10-CM | POA: Diagnosis not present

## 2022-03-27 DIAGNOSIS — E119 Type 2 diabetes mellitus without complications: Secondary | ICD-10-CM | POA: Diagnosis not present

## 2022-03-27 LAB — HM DIABETES EYE EXAM

## 2022-04-10 ENCOUNTER — Other Ambulatory Visit: Payer: Self-pay

## 2022-04-10 ENCOUNTER — Telehealth: Payer: Self-pay | Admitting: Family

## 2022-04-10 MED ORDER — SIMVASTATIN 10 MG PO TABS: 10.0000 mg | ORAL_TABLET | Freq: Every day | ORAL | 0 refills | Status: DC

## 2022-04-10 NOTE — Telephone Encounter (Signed)
Phuong Vanallen (Daughter) called stating [place] is going out of the country and needs an early refill on the following medication:  Prescription Request  04/10/2022  Is this a "Controlled Substance" medicine? No  LOV: 02/02/2022  What is the name of the medication or equipment?   simvastatin (ZOCOR) 10 MG tablet [[id number]]   Have you contacted your pharmacy to request a refill? No   Which pharmacy would you like this sent to?  [place], [address] [place] [address] Phone: [phone] Fax: [phone]    Patient notified that their request is being sent to the clinical staff for review and that they should receive a response within 2 business days.   Please advise at Mobile [phone] (mobile)

## 2022-04-10 NOTE — Telephone Encounter (Signed)
Rx sent 

## 2022-04-20 ENCOUNTER — Other Ambulatory Visit: Payer: Self-pay | Admitting: Family

## 2022-05-31 ENCOUNTER — Telehealth: Payer: Self-pay | Admitting: Family

## 2022-05-31 MED ORDER — SIMVASTATIN 10 MG PO TABS: 10.0000 mg | ORAL_TABLET | Freq: Every day | ORAL | 0 refills | Status: DC

## 2022-05-31 NOTE — Telephone Encounter (Signed)
Rx sent 

## 2022-05-31 NOTE — Telephone Encounter (Signed)
Medication: simvastatin (ZOCOR) 10 MG tablet  Has the patient contacted their pharmacy? Yes.     Preferred Pharmacy:   [facility], [address] [place], [place] [address] Phone: [phone]  Fax: [phone]

## 2022-06-04 NOTE — Progress Notes (Signed)
Subjective:   By signing my name below, I, Katherine Kelly, attest that this documentation has been prepared under the direction and in the presence of Katherine Pear, NP 06/05/22   Patient ID: Katherine Kelly, female    DOB: 1955-06-18, 67 y.o.   MRN: [id number]  Chief Complaint  Patient presents with   Hypertension    Here for follow up   Diabetes    Here for follow up    HPI Patient is in today for a 3 month follow up. She is accompanied by her daughter.   Hypertension: She has been monitoring her blood pressure at home. She has not had any issues with her medications.  BP Readings from Last 3 Encounters:  06/05/22 114/78  02/02/22 (!) 148/83  10/03/21 132/80   Diabetes: She has been monitoring her glucose levels at home. She reports average readings of 130-135 mg/dL. She recently took a trip to [place] and was eating well while there. Lab Results  Component Value Date   HGBA1C 7.8 (H) 02/02/2022   Refill: She requests a refill of her Losartan medication.   Vision: Her last eye exam was in December, before she took a trip to [place]. She visits [facility] for vision care.   Mammogram: She is due for a mammogram.   Past Medical History:  Diagnosis Date   ACE-inhibitor cough    ALLERGIC RHINITIS 01/03/2009   Qualifier: Diagnosis of  By: Wynona Luna    Aortic valve stenosis 08/30/2020   Arthritis    hands   Cataract, bilateral    COVID-19 vaccine series declined 11/01/2020   Diabetes mellitus, type 2 ([place]) 09/2006   on meds   Diabetes type 2, controlled ([place]) 11/27/2006   Qualifier: Diagnosis of  By: Loanne Drilling MD, Sean A    Diabetic neuropathy ([place]) 08/31/2013   on meds   Eczema 09/14/2014   Essential hypertension 11/27/2006   Qualifier: Diagnosis of  By: Loanne Drilling MD, Hilliard Clark A    History of colonic polyps    Hypertension    on meds   Osteoporosis    RHINOSINUSITIS, CHRONIC 07/07/2009   Qualifier: Diagnosis of  By: Wynona Luna      Past Surgical History:  Procedure Laterality Date   BELPHAROPTOSIS REPAIR Bilateral    BREAST BIOPSY Left    stereo tatic   CARDIOVASCULAR STRESS TEST  11/08/2006   CESAREAN SECTION  1980 & 1982   COLONOSCOPY  2014   JP-MAC-mov(good)-TA   ESOPHAGOGASTRODUODENOSCOPY  04/2006   POLYPECTOMY  2014   TA    Family History  Problem Relation Age of Onset   Colon polyps Sister 100   Colon polyps Brother 61   Colon cancer Neg Hx    Rectal cancer Neg Hx    Stomach cancer Neg Hx    Esophageal cancer Neg Hx     Social History   Socioeconomic History   Marital status: Married    Spouse name: Not on file   Number of children: Not on file   Years of education: Not on file   Highest education level: Not on file  Occupational History   Not on file  Tobacco Use   Smoking status: Never   Smokeless tobacco: Never  Vaping Use   Vaping Use: Never used  Substance and Sexual Activity   Alcohol use: No   Drug use: No   Sexual activity: Not on file  Other Topics Concern   Not on file  Social History Narrative   Lives with husband and daughter   Freight forwarder at [employer]   No pets   Did not complete high school   Enjoys travel and shopping   Social Determinants of Health   Financial Resource Strain: Not on file  Food Insecurity: Not on file  Transportation Needs: Not on file  Physical Activity: Not on file  Stress: Not on file  Social Connections: Not on file  Intimate Partner Violence: Not on file    Outpatient Medications Prior to Visit  Medication Sig Dispense Refill   alendronate (FOSAMAX) 70 MG tablet Take 1 tablet (70 mg total) by mouth once a week. Take with a full glass of water on an empty stomach in AM, sit upright for 90 minutes after taking 12 tablet 4   betamethasone dipropionate (DIPROLENE) 0.05 % cream Apply 1 application topically daily as needed. 30 g 1   Calcium Carbonate-Vit D-Min (CALTRATE 600+D PLUS MINERALS) 600-800 MG-UNIT CHEW Chew 1 tablet by  mouth 2 (two) times daily. 60 tablet    cholecalciferol (VITAMIN D) 1000 UNITS tablet Take 1,000 Units by mouth daily. Reported on 10/25/2015     Clobetasol Prop Emollient Base 0.05 % emollient cream Apply 1 application. topically 2 (two) times daily as needed. 30 g 1   fenofibrate 160 MG tablet Take 1 tablet (160 mg total) by mouth daily. 90 tablet 0   metFORMIN (GLUCOPHAGE) 1000 MG tablet Take 1 tablet (1,000 mg total) by mouth 2 (two) times daily with a meal. 180 tablet 1   simvastatin (ZOCOR) 10 MG tablet Take 1 tablet (10 mg total) by mouth at bedtime. 90 tablet 0   losartan-hydrochlorothiazide (HYZAAR) 100-25 MG tablet Take 1 tablet by mouth daily. 90 tablet 1   scopolamine (TRANSDERM-SCOP) 1 MG/3DAYS Place 1 patch (1.5 mg total) onto the skin every 3 (three) days. 3 patch 0   typhoid (VIVOTIF) DR capsule One capsule on alternate days (day 1, 3, 5, and 7) for a total of 4 doses; all doses should be complete at least 1 week prior to potential exposure 4 capsule 0   No facility-administered medications prior to visit.    Allergies  Allergen Reactions   Aspirin Other (See Comments)    unknown   Ace Inhibitors     weakness    ROS    See HPI Objective:    Physical Exam Constitutional:      General: She is awake. She is not in acute distress.    Appearance: Normal appearance. She is well-developed.  HENT:     Head: Normocephalic and atraumatic.     Right Ear: External ear normal.     Left Ear: External ear normal.  Eyes:     General: No scleral icterus. Neck:     Thyroid: No thyromegaly.  Cardiovascular:     Rate and Rhythm: Normal rate and regular rhythm.     Heart sounds: Normal heart sounds. No murmur heard. Pulmonary:     Effort: Pulmonary effort is normal. No respiratory distress.     Breath sounds: Normal breath sounds. No wheezing.  Musculoskeletal:     Cervical back: Neck supple.  Skin:    General: Skin is warm and dry.  Neurological:     Mental Status: She is  alert and oriented to person, place, and time.     Cranial Nerves: No facial asymmetry.  Psychiatric:        Mood and Affect: Mood normal.  Speech: Speech normal.        Behavior: Behavior normal.        Thought Content: Thought content normal.        Judgment: Judgment normal.     BP 114/78 (BP Location: Left Arm, Patient Position: Sitting)   Pulse 74   Temp 98.1 F (36.7 C) (Oral)   Resp 16   Wt 145 lb (65.8 kg)   SpO2 100%   BMI 26.52 kg/m  Wt Readings from Last 3 Encounters:  06/05/22 145 lb (65.8 kg)  02/02/22 141 lb (64 kg)  10/03/21 139 lb 9.6 oz (63.3 kg)       Assessment & Plan:  Controlled type 2 diabetes mellitus with microalbuminuria, without long-term current use of insulin (HCC) Assessment & Plan: Lab Results  Component Value Date   HGBA1C 7.8 (H) 02/02/2022   HGBA1C 6.9 (H) 06/02/2021   HGBA1C 7.2 (H) 11/14/2020   Lab Results  Component Value Date   MICROALBUR 0.8 02/02/2022   LDLCALC 45 02/02/2022   CREATININE 1.02 02/02/2022   Maintained on metformin. Was above goal last visit. Repeat A1C. If above goal still, would consider addition of Januvia.   Orders: -     Hemoglobin A1c -     Comprehensive metabolic panel -     Losartan Potassium-HCTZ; Take 1 tablet by mouth daily.  Dispense: 90 tablet; Refill: 1  Essential hypertension Assessment & Plan: BP Readings from Last 3 Encounters:  06/05/22 114/78  02/02/22 (!) 148/83  10/03/21 132/80   Initial BP reading was high but repeat looked great. Continue losartan/hctz.   Orders: -     Comprehensive metabolic panel  Breast cancer screening by mammogram -     3D Screening Mammogram, Left and Right; Future  Dyslipidemia Assessment & Plan: Lab Results  Component Value Date   CHOL 125 02/02/2022   HDL 53.30 02/02/2022   LDLCALC 45 02/02/2022   LDLDIRECT 64.9 12/17/2006   TRIG 135.0 02/02/2022   CHOLHDL 2 02/02/2022   Last LDL at goal.  Continue simvastatin and fenofibrate.     Osteoporosis, unspecified osteoporosis type, unspecified pathological fracture presence Assessment & Plan: Dexa up to date.  Continue fosamax/caltrate.       I,Rachel Rivera,acting as a Education administrator for Katherine Pear, NP.,have documented all relevant documentation on the behalf of KATALYN CONSIGLIO, NP,as directed by  Katherine Pear, NP while in the presence of Katherine Pear, NP.   I, Katherine Pear, NP, personally preformed the services described in this documentation.  All medical record entries made by the scribe were at my direction and in my presence.  I have reviewed the chart and discharge instructions (if applicable) and agree that the record reflects my personal performance and is accurate and complete. 06/05/22   Katherine Pear, NP

## 2022-06-05 ENCOUNTER — Telehealth: Payer: Self-pay | Admitting: Family

## 2022-06-05 ENCOUNTER — Ambulatory Visit (INDEPENDENT_AMBULATORY_CARE_PROVIDER_SITE_OTHER): Payer: Medicare Other | Admitting: Family

## 2022-06-05 VITALS — BP 114/78 | HR 74 | Temp 98.1°F | Resp 16 | Wt 145.0 lb

## 2022-06-05 DIAGNOSIS — E1129 Type 2 diabetes mellitus with other diabetic kidney complication: Secondary | ICD-10-CM

## 2022-06-05 DIAGNOSIS — R809 Proteinuria, unspecified: Secondary | ICD-10-CM | POA: Diagnosis not present

## 2022-06-05 DIAGNOSIS — Z1231 Encounter for screening mammogram for malignant neoplasm of breast: Secondary | ICD-10-CM | POA: Diagnosis not present

## 2022-06-05 DIAGNOSIS — E785 Hyperlipidemia, unspecified: Secondary | ICD-10-CM

## 2022-06-05 DIAGNOSIS — I10 Essential (primary) hypertension: Secondary | ICD-10-CM

## 2022-06-05 DIAGNOSIS — M810 Age-related osteoporosis without current pathological fracture: Secondary | ICD-10-CM | POA: Diagnosis not present

## 2022-06-05 LAB — COMPREHENSIVE METABOLIC PANEL
ALT: 20 U/L (ref 0–35)
AST: 24 U/L (ref 0–37)
Albumin: 4.5 g/dL (ref 3.5–5.2)
Alkaline Phosphatase: 44 U/L (ref 39–117)
BUN: 25 mg/dL — ABNORMAL HIGH (ref 6–23)
CO2: 29 mEq/L (ref 19–32)
Calcium: 10 mg/dL (ref 8.4–10.5)
Chloride: 100 mEq/L (ref 96–112)
Creatinine, Ser: 0.99 mg/dL (ref 0.40–1.20)
GFR: 59.56 mL/min — ABNORMAL LOW (ref >60.00)
Glucose, Bld: 161 mg/dL — ABNORMAL HIGH (ref 70–99)
Potassium: 4.2 mEq/L (ref 3.5–5.1)
Sodium: 138 mEq/L (ref 135–145)
Total Bilirubin: 0.6 mg/dL (ref 0.2–1.2)
Total Protein: 7.6 g/dL (ref 6.0–8.3)

## 2022-06-05 LAB — HEMOGLOBIN A1C: Hgb A1c MFr Bld: 8.7 % — ABNORMAL HIGH (ref 4.6–6.5)

## 2022-06-05 MED ORDER — LOSARTAN POTASSIUM-HCTZ 100-25 MG PO TABS: 1.0000 | ORAL_TABLET | Freq: Every day | ORAL | 1 refills | Status: DC

## 2022-06-05 MED ORDER — SITAGLIPTIN PHOSPHATE 50 MG PO TABS: 50.0000 mg | ORAL_TABLET | Freq: Every day | ORAL | 5 refills | Status: DC

## 2022-06-05 NOTE — Telephone Encounter (Signed)
Patient's daughter notified of provider's comments,  results and new prescription.

## 2022-06-05 NOTE — Telephone Encounter (Signed)
Records requested

## 2022-06-05 NOTE — Assessment & Plan Note (Addendum)
BP Readings from Last 3 Encounters:  06/05/22 114/78  02/02/22 (!) 148/83  10/03/21 132/80   Initial BP reading was high but repeat looked great. Continue losartan/hctz.

## 2022-06-05 NOTE — Telephone Encounter (Addendum)
Please call to request copy of last DM eye exam,  [facility].

## 2022-06-05 NOTE — Assessment & Plan Note (Addendum)
Lab Results  Component Value Date   HGBA1C 7.8 (H) 02/02/2022   HGBA1C 6.9 (H) 06/02/2021   HGBA1C 7.2 (H) 11/14/2020   Lab Results  Component Value Date   MICROALBUR 0.8 02/02/2022   LDLCALC 45 02/02/2022   CREATININE 1.02 02/02/2022   Maintained on metformin. Was above goal last visit. Repeat A1C. If above goal still, would consider addition of Januvia.

## 2022-06-05 NOTE — Assessment & Plan Note (Signed)
Dexa up to date.  Continue fosamax/caltrate.

## 2022-06-05 NOTE — Telephone Encounter (Signed)
Please contact daughter and let her know that mom's A1C has risen above goal. I wouldlike to add januvia 97m once daily. She should continue metformin and continue to work on diabetic diet.

## 2022-06-05 NOTE — Assessment & Plan Note (Signed)
Lab Results  Component Value Date   CHOL 125 02/02/2022   HDL 53.30 02/02/2022   LDLCALC 45 02/02/2022   LDLDIRECT 64.9 12/17/2006   TRIG 135.0 02/02/2022   CHOLHDL 2 02/02/2022   Last LDL at goal.  Continue simvastatin and fenofibrate.

## 2022-06-13 ENCOUNTER — Ambulatory Visit (INDEPENDENT_AMBULATORY_CARE_PROVIDER_SITE_OTHER): Payer: Medicare Other | Admitting: *Deleted

## 2022-06-13 ENCOUNTER — Telehealth: Payer: Self-pay | Admitting: Family

## 2022-06-13 DIAGNOSIS — Z0000 Encounter for general adult medical examination without abnormal findings: Secondary | ICD-10-CM | POA: Diagnosis not present

## 2022-06-13 NOTE — Telephone Encounter (Signed)
Rx sent 06/05/2022

## 2022-06-13 NOTE — Telephone Encounter (Signed)
Rx setn 2/20/2

## 2022-06-13 NOTE — Progress Notes (Signed)
Subjective:   Katherine Kelly is a 67 y.o. female who presents for an Initial Medicare Annual Wellness Visit.  I connected with  Katherine Kelly on 06/13/22 by a audio enabled telemedicine application and verified that I am speaking with the correct person using two identifiers.  Patient Location: Home  Provider Location: Office/Clinic  I discussed the limitations of evaluation and management by telemedicine. The patient expressed understanding and agreed to proceed.   Review of Systems     Cardiac Risk Factors include: advanced age (>14mn, >>73women);diabetes mellitus;dyslipidemia;hypertension  Objective:    There were no vitals filed for this visit. There is no height or weight on file to calculate BMI.     06/13/2022   11:03 AM  Advanced Directives  Does Patient Have a Medical Advance Directive? No  Would patient like information on creating a medical advance directive? No - Patient declined    Current Medications (verified) Outpatient Encounter Medications as of 06/13/2022  Medication Sig   alendronate (FOSAMAX) 70 MG tablet Take 1 tablet (70 mg total) by mouth once a week. Take with a full glass of water on an empty stomach in AM, sit upright for 90 minutes after taking   betamethasone dipropionate (DIPROLENE) 0.05 % cream Apply 1 application topically daily as needed.   Calcium Carbonate-Vit D-Min (CALTRATE 600+D PLUS MINERALS) 600-800 MG-UNIT CHEW Chew 1 tablet by mouth 2 (two) times daily.   cholecalciferol (VITAMIN D) 1000 UNITS tablet Take 1,000 Units by mouth daily. Reported on 10/25/2015   Clobetasol Prop Emollient Base 0.05 % emollient cream Apply 1 application. topically 2 (two) times daily as needed.   fenofibrate 160 MG tablet Take 1 tablet (160 mg total) by mouth daily.   losartan-hydrochlorothiazide (HYZAAR) 100-25 MG tablet Take 1 tablet by mouth daily.   metFORMIN (GLUCOPHAGE) 1000 MG tablet Take 1 tablet (1,000 mg total) by mouth 2 (two) times daily with a meal.    simvastatin (ZOCOR) 10 MG tablet Take 1 tablet (10 mg total) by mouth at bedtime.   sitaGLIPtin (JANUVIA) 50 MG tablet Take 1 tablet (50 mg total) by mouth daily.   No facility-administered encounter medications on file as of 06/13/2022.    Allergies (verified) Aspirin and Ace inhibitors   History: Past Medical History:  Diagnosis Date   ACE-inhibitor cough    ALLERGIC RHINITIS 01/03/2009   Qualifier: Diagnosis of  By: YWynona Luna   Aortic valve stenosis 08/30/2020   Arthritis    hands   Cataract, bilateral    COVID-19 vaccine series declined 11/01/2020   Diabetes mellitus, type 2 ([place] 09/2006   on meds   Diabetes type 2, controlled ([place] 11/27/2006   Qualifier: Diagnosis of  By: ELoanne DrillingMD, Sean A    Diabetic neuropathy ([place] 08/31/2013   on meds   Eczema 09/14/2014   Essential hypertension 11/27/2006   Qualifier: Diagnosis of  By: ELoanne DrillingMD, SHilliard ClarkA    History of colonic polyps    Hypertension    on meds   Osteoporosis    RHINOSINUSITIS, CHRONIC 07/07/2009   Qualifier: Diagnosis of  By: YWynona Luna   Past Surgical History:  Procedure Laterality Date   BELPHAROPTOSIS REPAIR Bilateral    BREAST BIOPSY Left    stereo tatic   CARDIOVASCULAR STRESS TEST  11/08/2006   CESAREAN [place]  COLONOSCOPY  2014   JP-MAC-mov(good)-TA   ESOPHAGOGASTRODUODENOSCOPY  04/2006   POLYPECTOMY  2014  TA   Family History  Problem Relation Age of Onset   Colon polyps Sister 44   Colon polyps Brother 101   Colon cancer Neg Hx    Rectal cancer Neg Hx    Stomach cancer Neg Hx    Esophageal cancer Neg Hx    Social History   Socioeconomic History   Marital status: Married    Spouse name: Not on file   Number of children: Not on file   Years of education: Not on file   Highest education level: Not on file  Occupational History   Not on file  Tobacco Use   Smoking status: Never   Smokeless tobacco: Never  Vaping Use   Vaping Use: Never used   Substance and Sexual Activity   Alcohol use: No   Drug use: No   Sexual activity: Not on file  Other Topics Concern   Not on file  Social History Narrative   Lives with husband and daughter   Freight forwarder at [employer]   No pets   Did not complete high school   Enjoys travel and shopping   Social Determinants of Health   Financial Resource Strain: Low Risk  (06/13/2022)   Overall Financial Resource Strain (CARDIA)    Difficulty of Paying Living Expenses: Not hard at all  Food Insecurity: No Food Insecurity (06/13/2022)   Hunger Vital Sign    Worried About Running Out of Food in the Last Year: Never true    [place] in the Last Year: Never true  Transportation Needs: No Transportation Needs (06/13/2022)   PRAPARE - Hydrologist (Medical): No    Lack of Transportation (Non-Medical): No  Physical Activity: Not on file  Stress: No Stress Concern Present (06/13/2022)   [place]    Feeling of Stress : Not at all  Social Connections: Moderately Integrated (06/13/2022)   Social Connection and Isolation Panel [NHANES]    Frequency of Communication with Friends and Family: More than three times a week    Frequency of Social Gatherings with Friends and Family: Once a week    Attends Religious Services: More than 4 times per year    Active Member of [employer] or Organizations: No    Attends Music therapist: Never    Marital Status: Married    Tobacco Counseling Counseling given: Not Answered   Clinical Intake:  Pre-visit preparation completed: Yes  Pain : No/denies pain  Nutritional Risks: None Diabetes: Yes CBG done?: No Did pt. bring in CBG monitor from home?: No  How often do you need to have someone help you when you read instructions, pamphlets, or other written materials from your doctor or pharmacy?: 1 - Never   Activities of Daily Living    06/13/2022    11:07 AM  In your present state of health, do you have any difficulty performing the following activities:  Hearing? 0  Vision? 0  Difficulty concentrating or making decisions? 0  Walking or climbing stairs? 0  Dressing or bathing? 0  Doing errands, shopping? 0  Preparing Food and eating ? N  Using the Toilet? N  In the past six months, have you accidently leaked urine? N  Do you have problems with loss of bowel control? N  Managing your Medications? N  Managing your Finances? N  Housekeeping or managing your Housekeeping? N    Patient Care Team: Debbrah Alar, NP as  PCP - General (Internal Medicine)  Indicate any recent Medical Services you may have received from other than [facility] providers in the past year (date may be approximate).     Assessment:   This is a routine wellness examination for [employer].  Hearing/Vision screen No results found.  Dietary issues and exercise activities discussed: Current Exercise Habits: The patient does not participate in regular exercise at present, Exercise limited by: None identified   Goals Addressed   None    Depression Screen    06/13/2022   11:06 AM 02/02/2022   10:56 AM 10/18/2020   10:49 AM 01/18/2020   11:43 AM 02/14/2018   10:08 AM 06/22/2016    2:31 PM  PHQ 2/9 Scores  PHQ - 2 Score 0 0 0 0 0 0  PHQ- 9 Score     0 0    Fall Risk    06/13/2022   11:03 AM 02/02/2022   10:56 AM 10/18/2020   10:49 AM  Fall Risk   Falls in the past year? 0 0 0  Number falls in past yr: 0 0 0  Injury with Fall? 0 0 0  Risk for fall due to : No Fall Risks    Follow up Falls evaluation completed      [place]:  Any stairs in or around the home? Yes  If so, are there any without handrails? No  Home free of loose throw rugs in walkways, pet beds, electrical cords, etc? Yes  Adequate lighting in your home to reduce risk of falls? Yes   ASSISTIVE DEVICES UTILIZED TO PREVENT FALLS:  Life alert? No  Use of  a cane, walker or w/c? No  Grab bars in the bathroom? No  Shower chair or bench in shower? Yes  Elevated toilet seat or a handicapped toilet? No   TIMED UP AND GO:  Was the test performed?  No, audio visit .   Cognitive Function:    06/13/2022   11:09 AM  MMSE - Mini Mental State Exam  Not completed: Unable to complete        Immunizations Immunization History  Administered Date(s) Administered   H1N1 04/29/2008   Influenza Whole 03/06/2004, 02/18/2007   Influenza,inj,Quad PF,6+ Mos 01/30/2013, 12/30/2013, 12/13/2014, 01/17/2018, 04/01/2019   PNEUMOCOCCAL CONJUGATE-20 10/03/2021   Pneumococcal Polysaccharide-23 08/31/2013   Tdap 12/22/2010   Zoster Recombinat (Shingrix) 11/01/2020    TDAP status: Due, Education has been provided regarding the importance of this vaccine. Advised may receive this vaccine at local pharmacy or Health Dept. Aware to provide a copy of the vaccination record if obtained from local pharmacy or Health Dept. Verbalized acceptance and understanding.  Flu Vaccine status: Declined, Education has been provided regarding the importance of this vaccine but patient still declined. Advised may receive this vaccine at local pharmacy or Health Dept. Aware to provide a copy of the vaccination record if obtained from local pharmacy or Health Dept. Verbalized acceptance and understanding.  Pneumococcal vaccine status: Up to date  Covid-19 vaccine status: Information provided on how to obtain vaccines.   Qualifies for Shingles Vaccine? Yes   Zostavax completed No   Shingrix Completed?: No.    Education has been provided regarding the importance of this vaccine. Patient has been advised to call insurance company to determine out of pocket expense if they have not yet received this vaccine. Advised may also receive vaccine at local pharmacy or Health Dept. Verbalized acceptance and understanding.  Screening Tests  Health Maintenance  Topic Date Due   Medicare  Annual Wellness (AWV)  Never done   DTaP/Tdap/Td (2 - Td or Tdap) 12/21/2020   Zoster Vaccines- Shingrix (2 of 2) 12/27/2020   MAMMOGRAM  05/24/2022   FOOT EXAM  06/02/2022   INFLUENZA VACCINE  07/15/2022 (Originally 11/14/2021)   HEMOGLOBIN A1C  12/04/2022   Diabetic kidney evaluation - Urine ACR  02/03/2023   OPHTHALMOLOGY EXAM  03/28/2023   Diabetic kidney evaluation - eGFR measurement  06/06/2023   COLONOSCOPY (Pts 45-26yr Insurance coverage will need to be confirmed)  08/09/2026   Pneumonia Vaccine 67 Years old  Completed   DEXA SCAN  Completed   Hepatitis C Screening  Completed   HPV VACCINES  Aged Out   COVID-19 Vaccine  Discontinued    Health Maintenance  Health Maintenance Due  Topic Date Due   Medicare Annual Wellness (AWV)  Never done   DTaP/Tdap/Td (2 - Td or Tdap) 12/21/2020   Zoster Vaccines- Shingrix (2 of 2) 12/27/2020   MAMMOGRAM  05/24/2022   FOOT EXAM  06/02/2022    Colorectal cancer screening: Type of screening: Colonoscopy. Completed 08/08/21. Repeat every 5 years  Mammogram status: Ordered 06/05/22. Pt provided with contact info and advised to call to schedule appt.   Bone Density status: Completed 03/12/22. Results reflect: Bone density results: OSTEOPOROSIS. Repeat every 2 years.  Lung Cancer Screening: (Low Dose CT Chest recommended if Age 67-80years, 30 pack-year currently smoking OR have quit w/in 15years.) does not qualify.   Additional Screening:  Hepatitis C Screening: does qualify; Completed 03/31/19  Vision Screening: Recommended annual ophthalmology exams for early detection of glaucoma and other disorders of the eye. Is the patient up to date with their annual eye exam?  Yes  Who is the provider or what is the name of the office in which the patient attends annual [facility] exams[facility] If pt is not established with a provider, would they like to be referred to a provider to establish care? No .   Dental Screening:  Recommended annual dental exams for proper oral hygiene  Community Resource Referral / Chronic Care Management: CRR required this visit?  No   CCM required this visit?  No      Plan:     I have personally reviewed and noted the following in the patient's chart:   Medical and social history Use of alcohol, tobacco or illicit drugs  Current medications and supplements including opioid prescriptions. Patient is not currently taking opioid prescriptions. Functional ability and status Nutritional status Physical activity Advanced directives List of other physicians Hospitalizations, surgeries, and ER visits in previous 12 months Vitals Screenings to include cognitive, depression, and falls Referrals and appointments  In addition, I have reviewed and discussed with patient certain preventive protocols, quality metrics, and best practice recommendations. A written personalized care plan for preventive services as well as general preventive health recommendations were provided to patient.   Due to this being a telephonic visit, the after visit summary with patients personalized plan was offered to patient via mail or my-chart. Patient would like to access on my-chart.  BBeatris Ship [place]  06/13/2022   Nurse Notes: None

## 2022-06-13 NOTE — Patient Instructions (Signed)
Katherine Kelly , Thank you for taking time to come for your Medicare Wellness Visit. I appreciate your ongoing commitment to your health goals. Please review the following plan we discussed and let me know if I can assist you in the future.   These are the goals we discussed:  Goals   None     This is a list of the screening recommended for you and due dates:  Health Maintenance  Topic Date Due   DTaP/Tdap/Td vaccine (2 - Td or Tdap) 12/21/2020   Zoster (Shingles) Vaccine (2 of 2) 12/27/2020   Mammogram  05/24/2022   Complete foot exam   06/02/2022   Flu Shot  07/15/2022*   Hemoglobin A1C  12/04/2022   Yearly kidney health urinalysis for diabetes  02/03/2023   Eye exam for diabetics  03/28/2023   Yearly kidney function blood test for diabetes  06/06/2023   Medicare Annual Wellness Visit  06/14/2023   Colon Cancer Screening  08/09/2026   Pneumonia Vaccine  Completed   DEXA scan (bone density measurement)  Completed   Hepatitis C Screening: USPSTF Recommendation to screen - Ages 84-79 yo.  Completed   HPV Vaccine  Aged Out   COVID-19 Vaccine  Discontinued  *Topic was postponed. The date shown is not the original due date.     Next appointment: Follow up in one year for your annual wellness visit.   Preventive Care 11 Years and Older, Female Preventive care refers to lifestyle choices and visits with your health care provider that can promote health and wellness. What does preventive care include? A yearly physical exam. This is also called an annual well check. Dental exams once or twice a year. Routine eye exams. Ask your health care provider how often you should have your eyes checked. Personal lifestyle choices, including: Daily care of your teeth and gums. Regular physical activity. Eating a healthy diet. Avoiding tobacco and drug use. Limiting alcohol use. Practicing safe sex. Taking low-dose aspirin every day. Taking vitamin and mineral supplements as recommended by  your health care provider. What happens during an annual well check? The services and screenings done by your health care provider during your annual well check will depend on your age, overall health, lifestyle risk factors, and family history of disease. Counseling  Your health care provider may ask you questions about your: Alcohol use. Tobacco use. Drug use. Emotional well-being. Home and relationship well-being. Sexual activity. Eating habits. History of falls. Memory and ability to understand (cognition). Work and work Statistician. Reproductive health. Screening  You may have the following tests or measurements: Height, weight, and BMI. Blood pressure. Lipid and cholesterol levels. These may be checked every 5 years, or more frequently if you are over 12 years old. Skin check. Lung cancer screening. You may have this screening every year starting at age 108 if you have a 30-pack-year history of smoking and currently smoke or have quit within the past 15 years. Fecal occult blood test (FOBT) of the stool. You may have this test every year starting at age 92. Flexible sigmoidoscopy or colonoscopy. You may have a sigmoidoscopy every 5 years or a colonoscopy every 10 years starting at age 80. Hepatitis C blood test. Hepatitis B blood test. Sexually transmitted disease (STD) testing. Diabetes screening. This is done by checking your blood sugar (glucose) after you have not eaten for a while (fasting). You may have this done every 1-3 years. Bone density scan. This is done to screen for osteoporosis. You  may have this done starting at age 67. Mammogram. This may be done every 1-2 years. Talk to your health care provider about how often you should have regular mammograms. Talk with your health care provider about your test results, treatment options, and if necessary, the need for more tests. Vaccines  Your health care provider may recommend certain vaccines, such as: Influenza  vaccine. This is recommended every year. Tetanus, diphtheria, and acellular pertussis (Tdap, Td) vaccine. You may need a Td booster every 10 years. Zoster vaccine. You may need this after age 28. Pneumococcal 13-valent conjugate (PCV13) vaccine. One dose is recommended after age 86. Pneumococcal polysaccharide (PPSV23) vaccine. One dose is recommended after age 60. Talk to your health care provider about which screenings and vaccines you need and how often you need them. This information is not intended to replace advice given to you by your health care provider. Make sure you discuss any questions you have with your health care provider. Document Released: 04/29/2015 Document Revised: 12/21/2015 Document Reviewed: 02/01/2015 Elsevier Interactive Patient Education  2017 [place] Prevention in the Home Falls can cause injuries. They can happen to people of all ages. There are many things you can do to make your home safe and to help prevent falls. What can I do on the outside of my home? Regularly fix the edges of walkways and driveways and fix any cracks. Remove anything that might make you trip as you walk through a door, such as a raised step or threshold. Trim any bushes or trees on the path to your home. Use bright outdoor lighting. Clear any walking paths of anything that might make someone trip, such as rocks or tools. Regularly check to see if handrails are loose or broken. Make sure that both sides of any steps have handrails. Any raised decks and porches should have guardrails on the edges. Have any leaves, snow, or ice cleared regularly. Use sand or salt on walking paths during winter. Clean up any spills in your garage right away. This includes oil or grease spills. What can I do in the bathroom? Use night lights. Install grab bars by the toilet and in the tub and shower. Do not use towel bars as grab bars. Use non-skid mats or decals in the tub or shower. If you  need to sit down in the shower, use a plastic, non-slip stool. Keep the floor dry. Clean up any water that spills on the floor as soon as it happens. Remove soap buildup in the tub or shower regularly. Attach bath mats securely with double-sided non-slip rug tape. Do not have throw rugs and other things on the floor that can make you trip. What can I do in the bedroom? Use night lights. Make sure that you have a light by your bed that is easy to reach. Do not use any sheets or blankets that are too big for your bed. They should not hang down onto the floor. Have a firm chair that has side arms. You can use this for support while you get dressed. Do not have throw rugs and other things on the floor that can make you trip. What can I do in the kitchen? Clean up any spills right away. Avoid walking on wet floors. Keep items that you use a lot in easy-to-reach places. If you need to reach something above you, use a strong step stool that has a grab bar. Keep electrical cords out of the way. Do not use floor  polish or wax that makes floors slippery. If you must use wax, use non-skid floor wax. Do not have throw rugs and other things on the floor that can make you trip. What can I do with my stairs? Do not leave any items on the stairs. Make sure that there are handrails on both sides of the stairs and use them. Fix handrails that are broken or loose. Make sure that handrails are as long as the stairways. Check any carpeting to make sure that it is firmly attached to the stairs. Fix any carpet that is loose or worn. Avoid having throw rugs at the top or bottom of the stairs. If you do have throw rugs, attach them to the floor with carpet tape. Make sure that you have a light switch at the top of the stairs and the bottom of the stairs. If you do not have them, ask someone to add them for you. What else can I do to help prevent falls? Wear shoes that: Do not have high heels. Have rubber  bottoms. Are comfortable and fit you well. Are closed at the toe. Do not wear sandals. If you use a stepladder: Make sure that it is fully opened. Do not climb a closed stepladder. Make sure that both sides of the stepladder are locked into place. Ask someone to hold it for you, if possible. Clearly mark and make sure that you can see: Any grab bars or handrails. First and last steps. Where the edge of each step is. Use tools that help you move around (mobility aids) if they are needed. These include: Canes. Walkers. Scooters. Crutches. Turn on the lights when you go into a dark area. Replace any light bulbs as soon as they burn out. Set up your furniture so you have a clear path. Avoid moving your furniture around. If any of your floors are uneven, fix them. If there are any pets around you, be aware of where they are. Review your medicines with your doctor. Some medicines can make you feel dizzy. This can increase your chance of falling. Ask your doctor what other things that you can do to help prevent falls. This information is not intended to replace advice given to you by your health care provider. Make sure you discuss any questions you have with your health care provider. Document Released: 01/27/2009 Document Revised: 09/08/2015 Document Reviewed: 05/07/2014 Elsevier Interactive Patient Education  2017 [employer].

## 2022-06-13 NOTE — Telephone Encounter (Signed)
Prescription Request  06/13/2022  Is this a "Controlled Substance" medicine? No  LOV: 06/05/2022  What is the name of the medication or equipment?   losartan-hydrochlorothiazide (HYZAAR) 100-25 MG tablet [id number]   Have you contacted your pharmacy to request a refill? No   Which pharmacy would you like this sent to?  [facility], [place] [address] Phone: [phone] Fax: [phone]    Patient notified that their request is being sent to the clinical staff for review and that they should receive a response within 2 business days.   Please advise at Mobile [phone] (mobile)

## 2022-06-17 ENCOUNTER — Other Ambulatory Visit: Payer: Self-pay | Admitting: Family

## 2022-07-02 ENCOUNTER — Encounter: Payer: Self-pay | Admitting: Family

## 2022-07-03 MED ORDER — BLOOD GLUCOSE TEST VI STRP: ORAL_STRIP | 12 refills | Status: DC

## 2022-08-21 ENCOUNTER — Ambulatory Visit (HOSPITAL_BASED_OUTPATIENT_CLINIC_OR_DEPARTMENT_OTHER): Payer: Medicare Other

## 2022-08-23 ENCOUNTER — Other Ambulatory Visit: Payer: Self-pay | Admitting: Family

## 2022-09-03 ENCOUNTER — Encounter: Payer: Self-pay | Admitting: Family

## 2022-09-03 ENCOUNTER — Ambulatory Visit (INDEPENDENT_AMBULATORY_CARE_PROVIDER_SITE_OTHER): Payer: Medicare Other | Admitting: Family

## 2022-09-03 VITALS — BP 137/78 | HR 70 | Ht 62.0 in | Wt 143.0 lb

## 2022-09-03 DIAGNOSIS — M810 Age-related osteoporosis without current pathological fracture: Secondary | ICD-10-CM | POA: Diagnosis not present

## 2022-09-03 DIAGNOSIS — E785 Hyperlipidemia, unspecified: Secondary | ICD-10-CM

## 2022-09-03 DIAGNOSIS — Z7984 Long term (current) use of oral hypoglycemic drugs: Secondary | ICD-10-CM | POA: Diagnosis not present

## 2022-09-03 DIAGNOSIS — R809 Proteinuria, unspecified: Secondary | ICD-10-CM

## 2022-09-03 DIAGNOSIS — I10 Essential (primary) hypertension: Secondary | ICD-10-CM

## 2022-09-03 DIAGNOSIS — E1129 Type 2 diabetes mellitus with other diabetic kidney complication: Secondary | ICD-10-CM

## 2022-09-03 LAB — BASIC METABOLIC PANEL
BUN: 26 mg/dL — ABNORMAL HIGH (ref 6–23)
CO2: 29 mEq/L (ref 19–32)
Calcium: 10.6 mg/dL — ABNORMAL HIGH (ref 8.4–10.5)
Chloride: 101 mEq/L (ref 96–112)
Creatinine, Ser: 1.02 mg/dL (ref 0.40–1.20)
GFR: 57.37 mL/min — ABNORMAL LOW (ref >60.00)
Glucose, Bld: 98 mg/dL (ref 70–99)
Potassium: 4.4 mEq/L (ref 3.5–5.1)
Sodium: 140 mEq/L (ref 135–145)

## 2022-09-03 LAB — HEMOGLOBIN A1C: Hgb A1c MFr Bld: 6.9 % — ABNORMAL HIGH (ref 4.6–6.5)

## 2022-09-03 MED ORDER — SIMVASTATIN 10 MG PO TABS: 10.0000 mg | ORAL_TABLET | Freq: Every day | ORAL | 1 refills | Status: DC

## 2022-09-03 MED ORDER — SITAGLIPTIN PHOSPHATE 50 MG PO TABS: 50.0000 mg | ORAL_TABLET | Freq: Every day | ORAL | 5 refills | Status: DC

## 2022-09-03 MED ORDER — METFORMIN HCL 1000 MG PO TABS: 1000.0000 mg | ORAL_TABLET | Freq: Two times a day (BID) | ORAL | 1 refills | Status: DC

## 2022-09-03 MED ORDER — LOSARTAN POTASSIUM-HCTZ 100-25 MG PO TABS: 1.0000 | ORAL_TABLET | Freq: Every day | ORAL | 1 refills | Status: DC

## 2022-09-03 NOTE — Assessment & Plan Note (Signed)
At goal on losartan hctz.

## 2022-09-03 NOTE — Assessment & Plan Note (Addendum)
Lab Results  Component Value Date   CHOL 125 02/02/2022   HDL 53.30 02/02/2022   LDLCALC 45 02/02/2022   LDLDIRECT 64.9 12/17/2006   TRIG 135.0 02/02/2022   CHOLHDL 2 02/02/2022   LDL at goal, continue simvastatin.

## 2022-09-03 NOTE — Progress Notes (Addendum)
Subjective:   By signing my name below, I, [name], attest that this documentation has been prepared under the direction and in the presence of Sandford Craze, NP. 09/03/2022   Patient ID: Katherine Kelly, female    DOB: [birth year], 67 y.o.   MRN: [id number]  Chief Complaint  Patient presents with   Follow-up    No concerns     HPI Patient is in today for a follow up visit.   Blood sugar:She continues measuring her blood sugars at home and reports they measure around 30-35. She continues taking 1000 mg 2x daily PO and 50 mg Januvia daily PO.  Lab Results  Component Value Date   HGBA1C 8.7 (H) 06/05/2022   Blood pressure: Her blood pressure is doing well during this visit. She continues taking 100-25 mg losartan-hydrochlorothiazide daily PO and reports no new issues while taking it.  BP Readings from Last 3 Encounters:  09/03/22 137/78  06/05/22 114/78  02/02/22 (!) 148/83   Pulse Readings from Last 3 Encounters:  09/03/22 70  06/05/22 74  02/02/22 96   Fosamax: She continues taking 70 mg Fosamax once every week. Her last bone density scan was 03/12/2022 and results showed she is osteoporotic. Repeat in 2 years.   Cholesterol: Her last cholesterol levels were stable. She continues taking 160 mg fenofibrate daily PO and 10 mg simvastatin and reports no new issues while taking them.  Lab Results  Component Value Date   CHOL 125 02/02/2022   HDL 53.30 02/02/2022   LDLCALC 45 02/02/2022   LDLDIRECT 64.9 12/17/2006   TRIG 135.0 02/02/2022   CHOLHDL 2 02/02/2022    Past Medical History:  Diagnosis Date   ACE-inhibitor cough    ALLERGIC RHINITIS 01/03/2009   Qualifier: Diagnosis of  By: Nena Jordan    Aortic valve stenosis 08/30/2020   Arthritis    hands   Cataract, bilateral    COVID-19 vaccine series declined 11/01/2020   Diabetes mellitus, type 2 (HCC) 09/2006   on meds   Diabetes type 2, controlled (HCC) 11/27/2006   Qualifier: Diagnosis of  By:  Everardo All MD, Sean A    Diabetic neuropathy (HCC) 08/31/2013   on meds   Eczema 09/14/2014   Essential hypertension 11/27/2006   Qualifier: Diagnosis of  By: Everardo All MD, Gregary Signs A    History of colonic polyps    Hypertension    on meds   Osteoporosis    RHINOSINUSITIS, CHRONIC 07/07/2009   Qualifier: Diagnosis of  By: Nena Jordan     Past Surgical History:  Procedure Laterality Date   BELPHAROPTOSIS REPAIR Bilateral    BREAST BIOPSY Left    stereo tatic   CARDIOVASCULAR STRESS TEST  11/08/2006   CESAREAN SECTION  1980 & 1982   COLONOSCOPY  2014   JP-MAC-mov(good)-TA   ESOPHAGOGASTRODUODENOSCOPY  04/2006   POLYPECTOMY  2014   TA    Family History  Problem Relation Age of Onset   Colon polyps Sister 42   Colon polyps Brother 7   Colon cancer Neg Hx    Rectal cancer Neg Hx    Stomach cancer Neg Hx    Esophageal cancer Neg Hx     Social History   Socioeconomic History   Marital status: Married    Spouse name: Not on file   Number of children: Not on file   Years of education: Not on file   Highest education level: Not on file  Occupational History  Not on file  Tobacco Use   Smoking status: Never   Smokeless tobacco: Never  Vaping Use   Vaping Use: Never used  Substance and Sexual Activity   Alcohol use: No   Drug use: No   Sexual activity: Not on file  Other Topics Concern   Not on file  Social History Narrative   Lives with husband and daughter   Production designer, 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[employer]   No pets   Did not complete high school   Enjoys travel and shopping   Social Determinants of Health   Financial Resource Strain: Low Risk  (06/13/2022)   Overall Financial Resource Strain (CARDIA)    Difficulty of Paying Living Expenses: Not hard at all  Food Insecurity: No Food Insecurity (06/13/2022)   Hunger Vital Sign    Worried About Running Out of Food in the Last Year: Never true    Ran Out of Food in the Last Year: Never true  Transportation Needs: No  Transportation Needs (06/13/2022)   PRAPARE - Administrator, Civil Service (Medical): No    Lack of Transportation (Non-Medical): No  Physical Activity: Not on file  Stress: No Stress Concern Present (06/13/2022)   [employer] of Occupational Health - Occupational Stress Questionnaire    Feeling of Stress : Not at all  Social Connections: Moderately Integrated (06/13/2022)   Social Connection and Isolation Panel [NHANES]    Frequency of Communication with Friends and Family: More than three times a week    Frequency of Social Gatherings with Friends and Family: Once a week    Attends Religious Services: More than 4 times per year    Active Member of [employer] or Organizations: No    Attends Banker Meetings: Never    Marital Status: Married  Catering manager Violence: Not At Risk (06/13/2022)   Humiliation, Afraid, Rape, and Kick questionnaire    Fear of Current or Ex-Partner: No    Emotionally Abused: No    Physically Abused: No    Sexually Abused: No    Outpatient Medications Prior to Visit  Medication Sig Dispense Refill   alendronate (FOSAMAX) 70 MG tablet Take 1 tablet (70 mg total) by mouth once a week. Take with a full glass of water on an empty stomach in AM, sit upright for 90 minutes after taking 12 tablet 4   betamethasone dipropionate (DIPROLENE) 0.05 % cream Apply 1 application topically daily as needed. 30 g 1   Calcium Carbonate-Vit D-Min (CALTRATE 600+D PLUS MINERALS) 600-800 MG-UNIT CHEW Chew 1 tablet by mouth 2 (two) times daily. 60 tablet    cholecalciferol (VITAMIN D) 1000 UNITS tablet Take 1,000 Units by mouth daily. Reported on 10/25/2015     Clobetasol Prop Emollient Base 0.05 % emollient cream Apply 1 application. topically 2 (two) times daily as needed. 30 g 1   fenofibrate 160 MG tablet Take 1 tablet by mouth once daily 90 tablet 1   Glucose Blood (BLOOD GLUCOSE TEST STRIPS) STRP Check blood sugars no more than twice daily 100 strip 12    losartan-hydrochlorothiazide (HYZAAR) 100-25 MG tablet Take 1 tablet by mouth daily. 90 tablet 1   metFORMIN (GLUCOPHAGE) 1000 MG tablet Take 1 tablet (1,000 mg total) by mouth 2 (two) times daily with a meal. 180 tablet 1   simvastatin (ZOCOR) 10 MG tablet TAKE 1 TABLET BY MOUTH AT BEDTIME 90 tablet 0   sitaGLIPtin (JANUVIA) 50 MG tablet Take 1 tablet (50 mg total) by mouth daily.  30 tablet 5   No facility-administered medications prior to visit.    Allergies  Allergen Reactions   Aspirin Other (See Comments)    unknown   Ace Inhibitors     weakness    ROS See HPI    Objective:    Physical Exam Constitutional:      General: She is not in acute distress.    Appearance: Normal appearance. She is not ill-appearing.  HENT:     Head: Normocephalic and atraumatic.     Right Ear: External ear normal.     Left Ear: External ear normal.  Eyes:     Extraocular Movements: Extraocular movements intact.     Pupils: Pupils are equal, round, and reactive to light.  Cardiovascular:     Rate and Rhythm: Normal rate and regular rhythm.     Pulses:          Dorsalis pedis pulses are 2+ on the right side and 2+ on the left side.       Posterior tibial pulses are 2+ on the right side and 2+ on the left side.     Heart sounds: Normal heart sounds. No murmur heard.    No gallop.  Pulmonary:     Effort: Pulmonary effort is normal. No respiratory distress.     Breath sounds: Normal breath sounds. No wheezing or rales.  Skin:    General: Skin is warm and dry.  Neurological:     Mental Status: She is alert and oriented to person, place, and time.  Psychiatric:        Judgment: Judgment normal.    Diabetic Foot Exam - Simple   Simple Foot Form Diabetic Foot exam was performed with the following findings: Yes 09/03/2022 11:30 AM  Visual Inspection No deformities, no ulcerations, no other skin breakdown bilaterally: Yes Sensation Testing Intact to touch and monofilament testing  bilaterally: Yes Pulse Check Posterior Tibialis and Dorsalis pulse intact bilaterally: Yes Comments     BP 137/78 (BP Location: Left Arm, Patient Position: Sitting, Cuff Size: Normal)   Pulse 70   Ht 5\' 2"  (1.575 m)   Wt 143 lb (64.9 kg)   SpO2 99%   BMI 26.16 kg/m  Wt Readings from Last 3 Encounters:  09/03/22 143 lb (64.9 kg)  06/05/22 145 lb (65.8 kg)  02/02/22 141 lb (64 kg)       Assessment & Plan:  Controlled type 2 diabetes mellitus with microalbuminuria, without long-term current use of insulin (HCC) Assessment & Plan: Lab Results  Component Value Date   HGBA1C 8.7 (H) 06/05/2022   HGBA1C 7.8 (H) 02/02/2022   HGBA1C 6.9 (H) 06/02/2021   Lab Results  Component Value Date   MICROALBUR 0.8 02/02/2022   LDLCALC 45 02/02/2022   CREATININE 0.99 06/05/2022   Last A1C was elevated. Her home glucose readings have been stable. Continues januvia and metformin. Obtain follow up A1C.    Orders: -     Hemoglobin A1c -     Losartan Potassium-HCTZ; Take 1 tablet by mouth daily.  Dispense: 90 tablet; Refill: 1 -     SITagliptin Phosphate; Take 1 tablet (50 mg total) by mouth daily.  Dispense: 30 tablet; Refill: 5 -     metFORMIN HCl; Take 1 tablet (1,000 mg total) by mouth 2 (two) times daily with a meal.  Dispense: 180 tablet; Refill: 1 -     Basic metabolic panel  Essential hypertension Assessment & Plan: At goal on losartan hctz.  Dyslipidemia Assessment & Plan: Lab Results  Component Value Date   CHOL 125 02/02/2022   HDL 53.30 02/02/2022   LDLCALC 45 02/02/2022   LDLDIRECT 64.9 12/17/2006   TRIG 135.0 02/02/2022   CHOLHDL 2 02/02/2022   LDL at goal, continue simvastatin.   Orders: -     Simvastatin; Take 1 tablet (10 mg total) by mouth at bedtime.  Dispense: 90 tablet; Refill: 1  Osteoporosis, unspecified osteoporosis type, unspecified pathological fracture presence Assessment & Plan: Continues fosamax.       I, Lemont Fillers, NP,  personally preformed the services described in this documentation.  All medical record entries made by the scribe were at my direction and in my presence.  I have reviewed the chart and discharge instructions (if applicable) and agree that the record reflects my personal performance and is accurate and complete. 09/03/2022   I,[name],acting as a scribe for Lemont Fillers, NP.,have documented all relevant documentation on the behalf of KETURA SALATA, NP,as directed by  Lemont Fillers, NP while in the presence of Lemont Fillers, NP.   Lemont Fillers, NP

## 2022-09-03 NOTE — Assessment & Plan Note (Addendum)
Lab Results  Component Value Date   HGBA1C 8.7 (H) 06/05/2022   HGBA1C 7.8 (H) 02/02/2022   HGBA1C 6.9 (H) 06/02/2021   Lab Results  Component Value Date   MICROALBUR 0.8 02/02/2022   LDLCALC 45 02/02/2022   CREATININE 0.99 06/05/2022   Last A1C was elevated. Her home glucose readings have been stable. Continues januvia and metformin. Obtain follow up A1C.

## 2022-09-03 NOTE — Assessment & Plan Note (Signed)
Continues fosamax.  

## 2022-09-10 ENCOUNTER — Ambulatory Visit (HOSPITAL_BASED_OUTPATIENT_CLINIC_OR_DEPARTMENT_OTHER): Payer: Medicare Other

## 2022-09-13 ENCOUNTER — Inpatient Hospital Stay (HOSPITAL_BASED_OUTPATIENT_CLINIC_OR_DEPARTMENT_OTHER): Admission: RE | Admit: 2022-09-13 | Payer: Medicare Other | Source: Ambulatory Visit

## 2022-09-18 ENCOUNTER — Ambulatory Visit (INDEPENDENT_AMBULATORY_CARE_PROVIDER_SITE_OTHER): Payer: Medicare Other | Admitting: Family

## 2022-09-18 VITALS — BP 122/75 | HR 81 | Temp 98.4°F | Resp 16 | Wt 142.0 lb

## 2022-09-18 DIAGNOSIS — E1129 Type 2 diabetes mellitus with other diabetic kidney complication: Secondary | ICD-10-CM

## 2022-09-18 DIAGNOSIS — R809 Proteinuria, unspecified: Secondary | ICD-10-CM | POA: Diagnosis not present

## 2022-09-18 DIAGNOSIS — Z1231 Encounter for screening mammogram for malignant neoplasm of breast: Secondary | ICD-10-CM

## 2022-09-18 DIAGNOSIS — R5383 Other fatigue: Secondary | ICD-10-CM

## 2022-09-18 DIAGNOSIS — Z7984 Long term (current) use of oral hypoglycemic drugs: Secondary | ICD-10-CM

## 2022-09-18 MED ORDER — SITAGLIPTIN PHOSPHATE 100 MG PO TABS: 100.0000 mg | ORAL_TABLET | Freq: Every day | ORAL | 5 refills | Status: DC

## 2022-09-18 MED ORDER — METFORMIN HCL 500 MG PO TABS: 500.0000 mg | ORAL_TABLET | Freq: Two times a day (BID) | ORAL | 1 refills | Status: DC

## 2022-09-18 NOTE — Progress Notes (Signed)
Subjective:     Patient ID: Katherine Kelly, female    DOB: January 25, 1956, 67 y.o.   MRN: [id number]  Chief Complaint  Patient presents with   Fatigue    Patient complains of feeling weak since started metformin 1000 mg   Dizziness    Complains of some dizziness in the last few days    HPI  Discussed the use of AI scribe software for clinical note transcription with the patient, who gave verbal consent to proceed.  History of Present Illness   Patient presents today with her husband who assists with translation. The patient, with a history of high blood pressure, presents with concerns about medication side effects. She reports feeling weak and dizzy after increasing the dose of an unspecified metformin from 500mg  to 1000mg . The increased dose also seems to stimulate her appetite, leading to increased food intake and a subsequent rise in blood sugar levels. The patient has tried to mitigate these side effects by reducing the metformin dose 500mg , which she reports as being more tolerable. However, attempts to return to the higher dose have resulted in the recurrence of the aforementioned side effects. The patient denies experiencing hypoglycemia, with the lowest recorded blood sugar level being around 120.          Health Maintenance Due  Topic Date Due   DTaP/Tdap/Td (2 - Td or Tdap) 12/21/2020   Zoster Vaccines- Shingrix (2 of 2) 12/27/2020   MAMMOGRAM  05/24/2022    Past Medical History:  Diagnosis Date   ACE-inhibitor cough    ALLERGIC RHINITIS 01/03/2009   Qualifier: Diagnosis of  By: Nena Jordan    Aortic valve stenosis 08/30/2020   Arthritis    hands   Cataract, bilateral    COVID-19 vaccine series declined 11/01/2020   Diabetes mellitus, type 2 (HCC) 09/2006   on meds   Diabetes type 2, controlled (HCC) 11/27/2006   Qualifier: Diagnosis of  By: Everardo All MD, Sean A    Diabetic neuropathy (HCC) 08/31/2013   on meds   Eczema 09/14/2014   Essential hypertension  11/27/2006   Qualifier: Diagnosis of  By: Everardo All MD, Gregary Signs A    History of colonic polyps    Hypertension    on meds   Osteoporosis    RHINOSINUSITIS, CHRONIC 07/07/2009   Qualifier: Diagnosis of  By: Nena Jordan     Past Surgical History:  Procedure Laterality Date   BELPHAROPTOSIS REPAIR Bilateral    BREAST BIOPSY Left    stereo tatic   CARDIOVASCULAR STRESS TEST  11/08/2006   CESAREAN SECTION  1980 & 1982   COLONOSCOPY  2014   JP-MAC-mov(good)-TA   ESOPHAGOGASTRODUODENOSCOPY  04/2006   POLYPECTOMY  2014   TA    Family History  Problem Relation Age of Onset   Colon polyps Sister 58   Colon polyps Brother 39   Colon cancer Neg Hx    Rectal cancer Neg Hx    Stomach cancer Neg Hx    Esophageal cancer Neg Hx     Social History   Socioeconomic History   Marital status: Married    Spouse name: Not on file   Number of children: Not on file   Years of education: Not on file   Highest education level: Not on file  Occupational History   Not on file  Tobacco Use   Smoking status: Never   Smokeless tobacco: Never  Vaping Use   Vaping Use: Never used  Substance  and Sexual Activity   Alcohol use: No   Drug use: No   Sexual activity: Not on file  Other Topics Concern   Not on file  Social History Narrative   Lives with husband and daughter   Production designer, 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[employer]   No pets   Did not complete high school   Enjoys travel and shopping   Social Determinants of Health   Financial Resource Strain: Low Risk  (06/13/2022)   Overall Financial Resource Strain (CARDIA)    Difficulty of Paying Living Expenses: Not hard at all  Food Insecurity: No Food Insecurity (06/13/2022)   Hunger Vital Sign    Worried About Running Out of Food in the Last Year: Never true    Ran Out of Food in the Last Year: Never true  Transportation Needs: No Transportation Needs (06/13/2022)   PRAPARE - Administrator, Civil Service (Medical): No    Lack of Transportation  (Non-Medical): No  Physical Activity: Not on file  Stress: No Stress Concern Present (06/13/2022)   [employer] of Occupational Health - Occupational Stress Questionnaire    Feeling of Stress : Not at all  Social Connections: Moderately Integrated (06/13/2022)   Social Connection and Isolation Panel [NHANES]    Frequency of Communication with Friends and Family: More than three times a week    Frequency of Social Gatherings with Friends and Family: Once a week    Attends Religious Services: More than 4 times per year    Active Member of [employer] or Organizations: No    Attends Banker Meetings: Never    Marital Status: Married  Catering manager Violence: Not At Risk (06/13/2022)   Humiliation, Afraid, Rape, and Kick questionnaire    Fear of Current or Ex-Partner: No    Emotionally Abused: No    Physically Abused: No    Sexually Abused: No    Outpatient Medications Prior to Visit  Medication Sig Dispense Refill   alendronate (FOSAMAX) 70 MG tablet Take 1 tablet (70 mg total) by mouth once a week. Take with a full glass of water on an empty stomach in AM, sit upright for 90 minutes after taking 12 tablet 4   betamethasone dipropionate (DIPROLENE) 0.05 % cream Apply 1 application topically daily as needed. 30 g 1   Calcium Carbonate-Vit D-Min (CALTRATE 600+D PLUS MINERALS) 600-800 MG-UNIT CHEW Chew 1 tablet by mouth 2 (two) times daily. 60 tablet    cholecalciferol (VITAMIN D) 1000 UNITS tablet Take 1,000 Units by mouth daily. Reported on 10/25/2015     Clobetasol Prop Emollient Base 0.05 % emollient cream Apply 1 application. topically 2 (two) times daily as needed. 30 g 1   fenofibrate 160 MG tablet Take 1 tablet by mouth once daily 90 tablet 1   Glucose Blood (BLOOD GLUCOSE TEST STRIPS) STRP Check blood sugars no more than twice daily 100 strip 12   losartan-hydrochlorothiazide (HYZAAR) 100-25 MG tablet Take 1 tablet by mouth daily. 90 tablet 1   simvastatin (ZOCOR) 10 MG  tablet Take 1 tablet (10 mg total) by mouth at bedtime. 90 tablet 1   metFORMIN (GLUCOPHAGE) 1000 MG tablet Take 1 tablet (1,000 mg total) by mouth 2 (two) times daily with a meal. 180 tablet 1   sitaGLIPtin (JANUVIA) 50 MG tablet Take 1 tablet (50 mg total) by mouth daily. 30 tablet 5   No facility-administered medications prior to visit.    Allergies  Allergen Reactions   Aspirin Other (See Comments)  unknown   Ace Inhibitors     weakness    ROS    See HPI  Objective:    Physical Exam Constitutional:      General: She is not in acute distress.    Appearance: Normal appearance. She is well-developed.  HENT:     Head: Normocephalic and atraumatic.     Right Ear: External ear normal.     Left Ear: External ear normal.  Eyes:     General: No scleral icterus. Neck:     Thyroid: No thyromegaly.  Cardiovascular:     Rate and Rhythm: Normal rate and regular rhythm.     Heart sounds: Normal heart sounds. No murmur heard. Pulmonary:     Effort: Pulmonary effort is normal. No respiratory distress.     Breath sounds: Normal breath sounds. No wheezing.  Musculoskeletal:     Cervical back: Neck supple.  Skin:    General: Skin is warm and dry.  Neurological:     Mental Status: She is alert and oriented to person, place, and time.  Psychiatric:        Mood and Affect: Mood normal.        Behavior: Behavior normal.        Thought Content: Thought content normal.        Judgment: Judgment normal.      BP 122/75 (BP Location: Right Arm, Patient Position: Sitting, Cuff Size: Small)   Pulse 81   Temp 98.4 F (36.9 C) (Oral)   Resp 16   Wt 142 lb (64.4 kg)   SpO2 99%   BMI 25.97 kg/m  Wt Readings from Last 3 Encounters:  09/18/22 142 lb (64.4 kg)  09/03/22 143 lb (64.9 kg)  06/05/22 145 lb (65.8 kg)       Assessment & Plan:   Problem List Items Addressed This Visit       Unprioritized   Diabetes type 2, controlled (HCC)     Patient reports feeling weak and  dizzy with increased hunger when taking 1000mg  of medication. No reported hypoglycemia. Blood glucose levels reported to be elevated. -Reduce medication dose to 500mg  daily. -Continue to monitor blood glucose levels at home. -Will increase januvia from 50mg  to 100mg  once daily.      Relevant Medications   sitaGLIPtin (JANUVIA) 100 MG tablet   metFORMIN (GLUCOPHAGE) 500 MG tablet   Other Visit Diagnoses     Other fatigue    -  Primary   Relevant Orders   CBC   Comp Met (CMET)   TSH   Breast cancer screening by mammogram       Relevant Orders   MM 3D SCREENING MAMMOGRAM BILATERAL BREAST       I have discontinued Zenaida Niece D. Goslin's sitaGLIPtin and metFORMIN. I am also having her start on sitaGLIPtin and metFORMIN. Additionally, I am having her maintain her cholecalciferol, betamethasone dipropionate, Clobetasol Prop Emollient Base, Caltrate 600+D Plus Minerals, alendronate, fenofibrate, BLOOD GLUCOSE TEST STRIPS, losartan-hydrochlorothiazide, and simvastatin.  Meds ordered this encounter  Medications   sitaGLIPtin (JANUVIA) 100 MG tablet    Sig: Take 1 tablet (100 mg total) by mouth daily.    Dispense:  30 tablet    Refill:  5    Order Specific Question:   Supervising Provider    Answer:   Danise Edge A [[id number]]   metFORMIN (GLUCOPHAGE) 500 MG tablet    Sig: Take 1 tablet (500 mg total) by mouth 2 (two) times daily with a meal.  Dispense:  180 tablet    Refill:  1    Order Specific Question:   Supervising Provider    Answer:   Danise Edge A [[id number]]

## 2022-09-18 NOTE — Assessment & Plan Note (Signed)
Patient reports feeling weak and dizzy with increased hunger when taking 1000mg  of medication. No reported hypoglycemia. Blood glucose levels reported to be elevated. -Reduce medication dose to 500mg  daily. -Continue to monitor blood glucose levels at home. -Will increase januvia from 50mg  to 100mg  once daily.

## 2022-09-19 LAB — COMPREHENSIVE METABOLIC PANEL
ALT: 20 U/L (ref 0–35)
AST: 24 U/L (ref 0–37)
Albumin: 4.6 g/dL (ref 3.5–5.2)
Alkaline Phosphatase: 38 U/L — ABNORMAL LOW (ref 39–117)
BUN: 26 mg/dL — ABNORMAL HIGH (ref 6–23)
CO2: 24 mEq/L (ref 19–32)
Calcium: 10.1 mg/dL (ref 8.4–10.5)
Chloride: 99 mEq/L (ref 96–112)
Creatinine, Ser: 1.02 mg/dL (ref 0.40–1.20)
GFR: 57.35 mL/min — ABNORMAL LOW (ref >60.00)
Glucose, Bld: 201 mg/dL — ABNORMAL HIGH (ref 70–99)
Potassium: 3.7 mEq/L (ref 3.5–5.1)
Sodium: 137 mEq/L (ref 135–145)
Total Bilirubin: 0.5 mg/dL (ref 0.2–1.2)
Total Protein: 7.8 g/dL (ref 6.0–8.3)

## 2022-09-19 LAB — CBC
HCT: 39.7 % (ref 36.0–46.0)
Hemoglobin: 13.1 g/dL (ref 12.0–15.0)
MCHC: 33 g/dL (ref 30.0–36.0)
MCV: 97.7 fl (ref 78.0–100.0)
Platelets: 316 10*3/uL (ref 150.0–400.0)
RBC: 4.06 Mil/uL (ref 3.87–5.11)
RDW: 13.1 % (ref 11.5–15.5)
WBC: 9.2 10*3/uL (ref 4.0–10.5)

## 2022-09-19 LAB — TSH: TSH: 1.28 u[IU]/mL (ref 0.35–5.50)

## 2022-09-20 ENCOUNTER — Encounter (HOSPITAL_BASED_OUTPATIENT_CLINIC_OR_DEPARTMENT_OTHER): Payer: Self-pay

## 2022-09-20 ENCOUNTER — Ambulatory Visit (HOSPITAL_BASED_OUTPATIENT_CLINIC_OR_DEPARTMENT_OTHER)
Admission: RE | Admit: 2022-09-20 | Discharge: 2022-09-20 | Disposition: A | Discharge status: Home / Self Care | Payer: Medicare Other | Source: Ambulatory Visit | Attending: Family | Admitting: Family

## 2022-09-20 DIAGNOSIS — Z1231 Encounter for screening mammogram for malignant neoplasm of breast: Secondary | ICD-10-CM | POA: Diagnosis not present

## 2022-10-05 ENCOUNTER — Other Ambulatory Visit: Payer: Self-pay | Admitting: Family

## 2022-12-27 ENCOUNTER — Telehealth: Payer: Self-pay | Admitting: Family

## 2022-12-27 NOTE — Telephone Encounter (Signed)
Patient's daughter reports pharmacy is given her mother a rx for metformin 1000 bid. The last rx we sent was fir Metformin 500 bid.  Pharmacy notified to deactivate the old 1000 mg bid rx and provide 09/18/22 rx for metformin 500 bid.

## 2022-12-27 NOTE — Telephone Encounter (Signed)
Pt's daughter called & requested that the pt's medication, metFORMIN (GLUCOPHAGE) 500 MG tablet, be reduced to 500 mg. Please call & advise.

## 2023-03-04 ENCOUNTER — Encounter: Payer: Self-pay | Admitting: Family

## 2023-03-04 MED ORDER — METFORMIN HCL 1000 MG PO TABS: 1000.0000 mg | ORAL_TABLET | Freq: Two times a day (BID) | ORAL | 1 refills | Status: DC

## 2023-03-05 ENCOUNTER — Other Ambulatory Visit: Payer: Self-pay | Admitting: Family

## 2023-05-07 ENCOUNTER — Telehealth: Payer: Self-pay | Admitting: Family

## 2023-05-07 ENCOUNTER — Other Ambulatory Visit: Payer: Self-pay

## 2023-05-07 MED ORDER — FENOFIBRATE 160 MG PO TABS: 160.0000 mg | ORAL_TABLET | Freq: Every day | ORAL | 1 refills | Status: DC

## 2023-05-07 NOTE — Telephone Encounter (Signed)
Copied from CRM [id number]. Topic: Clinical - Medication Refill >> May 07, 2023  9:49 AM Fredrich Romans wrote: Most Recent Primary Care Visit:  Provider: O'SULLIVAN, MELISSA  Department: [facility]  Visit Type: OFFICE VISIT  Date: 09/18/2022  Medication:  fenofibrate 160 MG tablet   Has the patient contacted their pharmacy? No (Agent: If no, request that the patient contact the pharmacy for the refill. If patient does not wish to contact the pharmacy document the reason why and proceed with request.) (Agent: If yes, when and what did the pharmacy advise?)  Is this the correct pharmacy for this prescription? Yes If no, delete pharmacy and type the correct one.  This is the patient's preferred pharmacy:  [facility] [address], [place] - [address] [place] [address] Phone: [phone] Fax: [phone]   Has the prescription been filled recently? Yes  Is the patient out of the medication? Yes  Has the patient been seen for an appointment in the last year OR does the patient have an upcoming appointment? No  Can we respond through MyChart? Yes  Agent: Please be advised that Rx refills may take up to 3 business days. We ask that you follow-up with your pharmacy.

## 2023-05-07 NOTE — Telephone Encounter (Signed)
Rx sent and patient scheduled to come in Monday for follow up

## 2023-05-13 ENCOUNTER — Telehealth: Payer: Self-pay | Admitting: Family

## 2023-05-13 ENCOUNTER — Ambulatory Visit (INDEPENDENT_AMBULATORY_CARE_PROVIDER_SITE_OTHER): Payer: Medicare Other | Admitting: Family

## 2023-05-13 VITALS — BP 138/82 | HR 73 | Temp 98.7°F | Resp 16 | Ht 62.0 in | Wt 143.0 lb

## 2023-05-13 DIAGNOSIS — M810 Age-related osteoporosis without current pathological fracture: Secondary | ICD-10-CM

## 2023-05-13 DIAGNOSIS — E781 Pure hyperglyceridemia: Secondary | ICD-10-CM

## 2023-05-13 DIAGNOSIS — E1129 Type 2 diabetes mellitus with other diabetic kidney complication: Secondary | ICD-10-CM

## 2023-05-13 DIAGNOSIS — E0842 Diabetes mellitus due to underlying condition with diabetic polyneuropathy: Secondary | ICD-10-CM

## 2023-05-13 DIAGNOSIS — I10 Essential (primary) hypertension: Secondary | ICD-10-CM | POA: Diagnosis not present

## 2023-05-13 DIAGNOSIS — I350 Nonrheumatic aortic (valve) stenosis: Secondary | ICD-10-CM

## 2023-05-13 DIAGNOSIS — Z7984 Long term (current) use of oral hypoglycemic drugs: Secondary | ICD-10-CM

## 2023-05-13 DIAGNOSIS — E785 Hyperlipidemia, unspecified: Secondary | ICD-10-CM

## 2023-05-13 DIAGNOSIS — R809 Proteinuria, unspecified: Secondary | ICD-10-CM

## 2023-05-13 LAB — COMPREHENSIVE METABOLIC PANEL
ALT: 18 U/L (ref 0–35)
AST: 23 U/L (ref 0–37)
Albumin: 4.9 g/dL (ref 3.5–5.2)
Alkaline Phosphatase: 39 U/L (ref 39–117)
BUN: 25 mg/dL — ABNORMAL HIGH (ref 6–23)
CO2: 26 meq/L (ref 19–32)
Calcium: 9.9 mg/dL (ref 8.4–10.5)
Chloride: 101 meq/L (ref 96–112)
Creatinine, Ser: 0.96 mg/dL (ref 0.40–1.20)
GFR: 61.4 mL/min (ref >60.00)
Glucose, Bld: 136 mg/dL — ABNORMAL HIGH (ref 70–99)
Potassium: 3.8 meq/L (ref 3.5–5.1)
Sodium: 137 meq/L (ref 135–145)
Total Bilirubin: 0.7 mg/dL (ref 0.2–1.2)
Total Protein: 7.5 g/dL (ref 6.0–8.3)

## 2023-05-13 LAB — LIPID PANEL
Cholesterol: 141 mg/dL (ref 0–200)
HDL: 54.9 mg/dL (ref >39.00)
LDL Cholesterol: 64 mg/dL (ref 0–99)
NonHDL: 86.08
Total CHOL/HDL Ratio: 3
Triglycerides: 110 mg/dL (ref 0.0–149.0)
VLDL: 22 mg/dL (ref 0.0–40.0)

## 2023-05-13 LAB — MICROALBUMIN / CREATININE URINE RATIO
Creatinine,U: 72.2 mg/dL
Microalb Creat Ratio: 1 mg/g (ref 0.0–30.0)
Microalb, Ur: <0.7 mg/dL (ref 0.0–1.9)

## 2023-05-13 LAB — HEMOGLOBIN A1C: Hgb A1c MFr Bld: 7.8 % — ABNORMAL HIGH (ref 4.6–6.5)

## 2023-05-13 MED ORDER — SIMVASTATIN 10 MG PO TABS: 10.0000 mg | ORAL_TABLET | Freq: Every day | ORAL | 1 refills | Status: AC

## 2023-05-13 MED ORDER — METFORMIN HCL 1000 MG PO TABS: 1000.0000 mg | ORAL_TABLET | Freq: Two times a day (BID) | ORAL | 1 refills | Status: DC

## 2023-05-13 MED ORDER — ALENDRONATE SODIUM 70 MG PO TABS: 70.0000 mg | ORAL_TABLET | ORAL | 4 refills | Status: AC

## 2023-05-13 MED ORDER — LOSARTAN POTASSIUM-HCTZ 100-25 MG PO TABS: 1.0000 | ORAL_TABLET | Freq: Every day | ORAL | 1 refills | Status: DC

## 2023-05-13 NOTE — Assessment & Plan Note (Signed)
Last echo 2022, clinically compensated. Update Echo.

## 2023-05-13 NOTE — Assessment & Plan Note (Addendum)
BP Readings from Last 3 Encounters:  05/13/23 (!) 144/70  09/18/22 122/75  09/03/22 137/78   Initial bp slightly elevated. Repeat bp at goal. Continue hyzaar.

## 2023-05-13 NOTE — Patient Instructions (Signed)
VISIT SUMMARY:  You came in today for a routine follow-up visit. You reported feeling well overall, with no new health concerns. We reviewed your current medications and noted that your blood pressure was slightly elevated. We also discussed your cholesterol management, diabetes control, osteoporosis treatment, and the need for follow-up on your aortic valve condition. Additionally, we talked about your vaccination status and eye health.  YOUR PLAN:  -HYPERTENSION: Hypertension means high blood pressure. Your blood pressure was slightly elevated today despite taking your medication. We will recheck your blood pressure today to monitor it closely.  -HYPERLIPIDEMIA: Hyperlipidemia means high cholesterol levels. You are currently taking Fenofibrate and Simvastatin for this. We will order a lipid panel today to check your cholesterol levels, as it has been over a year since your last test.  -OSTEOPOROSIS: Osteoporosis means thinning bones. You are taking Fosamax once a week and occasionally supplementing with calcium. We encourage you to continue with your current treatment and daily calcium intake. We plan to repeat your bone density scan in a year.  -DIABETES MELLITUS: Diabetes Mellitus is a condition where your blood sugar levels are too high. Your last A1c test in May showed an improvement. We will order another A1c test today and follow up in 3 months to monitor your glucose control.  -AORTIC STENOSIS: Aortic Stenosis means a narrowing of the aortic valve in your heart. Your last echocardiogram in 2022 showed a tight aortic valve. We will order another echocardiogram to reassess your valve condition.  -SHINGLES VACCINATION: You received the first dose of the shingles vaccine in 2022 and are due for the second dose. We advise you to get the second dose at your pharmacy.  -EYE HEALTH: You had your last eye exam in December 2023 with no reported issues. We will request a copy of your last report from  [facility].  INSTRUCTIONS:  Please follow up in 3 months. Today, we will recheck your blood pressure, order a lipid panel, and an A1c test. We will also order an echocardiogram to reassess your aortic valve. Don't forget to get your second dose of the shingles vaccine at your pharmacy.

## 2023-05-13 NOTE — Telephone Encounter (Signed)
Please request copy of DM Eye exam from [facility].

## 2023-05-13 NOTE — Progress Notes (Signed)
Subjective:     Patient ID: Katherine Kelly, female    DOB: December 30, 1955, 68 y.o.   MRN: [id number]  Chief Complaint  Patient presents with   Diabetes    Here for follow up   Hypertension    Here for follow up    HPI  Discussed the use of AI scribe software for clinical note transcription with the patient, who gave verbal consent to proceed.  History of Present Illness   The patient presents today with her daughter for follow up.   She reports no new health concerns and overall well-being.  The patient's blood pressure was noted to be slightly elevated, despite adherence to daily losartan hydrochlorothiazide. She also continues to take fenofibrate and simvastatin for cholesterol management, and Fosamax once a week for osteoporosis. The patient occasionally supplements with calcium.  The patient denies any neuropathic pain in the feet, but reports occasional heel pain, likely musculoskeletal in nature. Her last HbA1c, checked in May, showed an improvement from 8.7 to 6.9.  In 2022, an echocardiogram revealed mild/moderate AS. The patient denies any associated symptoms such as shortness of breath. She received the first dose of the shingles vaccine in 2022 and is due for the second dose.  The patient's last ophthalmology visit was in December 2023, with no reported issues. All prescriptions are filled at a [facility].         Health Maintenance Due  Topic Date Due   DTaP/Tdap/Td (2 - Td or Tdap) 12/21/2020   Zoster Vaccines- Shingrix (2 of 2) 12/27/2020   Diabetic kidney evaluation - Urine ACR  02/03/2023   HEMOGLOBIN A1C  03/06/2023   OPHTHALMOLOGY EXAM  03/28/2023   Medicare Annual Wellness (AWV)  06/14/2023    Past Medical History:  Diagnosis Date   ACE-inhibitor cough    ALLERGIC RHINITIS 01/03/2009   Qualifier: Diagnosis of  By: Nena Jordan    Aortic valve stenosis 08/30/2020   Arthritis    hands   Cataract, bilateral    COVID-19 vaccine series  declined 11/01/2020   Diabetes mellitus, type 2 (HCC) 09/2006   on meds   Diabetes type 2, controlled (HCC) 11/27/2006   Qualifier: Diagnosis of  By: Everardo All MD, Sean A    Diabetic neuropathy (HCC) 08/31/2013   on meds   Eczema 09/14/2014   Essential hypertension 11/27/2006   Qualifier: Diagnosis of  By: Everardo All MD, Gregary Signs A    History of colonic polyps    Hypertension    on meds   Osteoporosis    RHINOSINUSITIS, CHRONIC 07/07/2009   Qualifier: Diagnosis of  By: Nena Jordan     Past Surgical History:  Procedure Laterality Date   BELPHAROPTOSIS REPAIR Bilateral    BREAST BIOPSY Left    stereo tatic   CARDIOVASCULAR STRESS TEST  11/08/2006   CESAREAN SECTION  1980 & 1982   COLONOSCOPY  2014   JP-MAC-mov(good)-TA   ESOPHAGOGASTRODUODENOSCOPY  04/2006   POLYPECTOMY  2014   TA    Family History  Problem Relation Age of Onset   Colon polyps Sister 19   Colon polyps Brother 40   Colon cancer Neg Hx    Rectal cancer Neg Hx    Stomach cancer Neg Hx    Esophageal cancer Neg Hx     Social History   Socioeconomic History   Marital status: Married    Spouse name: Not on file   Number of children: Not on file  Years of education: Not on file   Highest education level: Not on file  Occupational History   Not on file  Tobacco Use   Smoking status: Never   Smokeless tobacco: Never  Vaping Use   Vaping status: Never Used  Substance and Sexual Activity   Alcohol use: No   Drug use: No   Sexual activity: Not on file  Other Topics Concern   Not on file  Social History Narrative   Lives with husband and daughter   Production designer, 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[employer]   No pets   Did not complete high school   Enjoys travel and shopping   Social Drivers of Health   Financial Resource Strain: Low Risk  (06/13/2022)   Overall Financial Resource Strain (CARDIA)    Difficulty of Paying Living Expenses: Not hard at all  Food Insecurity: No Food Insecurity (06/13/2022)   Hunger Vital Sign     Worried About Running Out of Food in the Last Year: Never true    Ran Out of Food in the Last Year: Never true  Transportation Needs: No Transportation Needs (06/13/2022)   PRAPARE - Administrator, Civil Service (Medical): No    Lack of Transportation (Non-Medical): No  Physical Activity: Not on file  Stress: No Stress Concern Present (06/13/2022)   [employer] of Occupational Health - Occupational Stress Questionnaire    Feeling of Stress : Not at all  Social Connections: Moderately Integrated (06/13/2022)   Social Connection and Isolation Panel [NHANES]    Frequency of Communication with Friends and Family: More than three times a week    Frequency of Social Gatherings with Friends and Family: Once a week    Attends Religious Services: More than 4 times per year    Active Member of [employer] or Organizations: No    Attends Banker Meetings: Never    Marital Status: Married  Catering manager Violence: Not At Risk (06/13/2022)   Humiliation, Afraid, Rape, and Kick questionnaire    Fear of Current or Ex-Partner: No    Emotionally Abused: No    Physically Abused: No    Sexually Abused: No    Outpatient Medications Prior to Visit  Medication Sig Dispense Refill   betamethasone dipropionate (DIPROLENE) 0.05 % cream Apply 1 application topically daily as needed. 30 g 1   Calcium Carbonate-Vit D-Min (CALTRATE 600+D PLUS MINERALS) 600-800 MG-UNIT CHEW Chew 1 tablet by mouth 2 (two) times daily. 60 tablet    cholecalciferol (VITAMIN D) 1000 UNITS tablet Take 1,000 Units by mouth daily. Reported on 10/25/2015     Clobetasol Prop Emollient Base 0.05 % emollient cream Apply 1 application. topically 2 (two) times daily as needed. 30 g 1   fenofibrate 160 MG tablet Take 1 tablet (160 mg total) by mouth daily. 90 tablet 1   Glucose Blood (BLOOD GLUCOSE TEST STRIPS) STRP Check blood sugars no more than twice daily 100 strip 12   alendronate (FOSAMAX) 70 MG tablet Take 1  tablet (70 mg total) by mouth once a week. Take with a full glass of water on an empty stomach in AM, sit upright for 90 minutes after taking 12 tablet 4   losartan-hydrochlorothiazide (HYZAAR) 100-25 MG tablet Take 1 tablet by mouth daily. 90 tablet 1   metFORMIN (GLUCOPHAGE) 1000 MG tablet Take 1 tablet (1,000 mg total) by mouth 2 (two) times daily with a meal. 180 tablet 1   simvastatin (ZOCOR) 10 MG tablet Take 1 tablet (10 mg  total) by mouth at bedtime. 90 tablet 1   No facility-administered medications prior to visit.    Allergies  Allergen Reactions   Aspirin Other (See Comments)    unknown   Ace Inhibitors     weakness    ROS See HPI    Objective:    Physical Exam Constitutional:      General: She is not in acute distress.    Appearance: Normal appearance. She is well-developed.  HENT:     Head: Normocephalic and atraumatic.     Right Ear: External ear normal.     Left Ear: External ear normal.  Eyes:     General: No scleral icterus. Neck:     Thyroid: No thyromegaly.  Cardiovascular:     Rate and Rhythm: Normal rate and regular rhythm.     Heart sounds: Normal heart sounds. No murmur heard. Pulmonary:     Effort: Pulmonary effort is normal. No respiratory distress.     Breath sounds: Normal breath sounds. No wheezing.  Musculoskeletal:     Cervical back: Neck supple.  Skin:    General: Skin is warm and dry.  Neurological:     Mental Status: She is alert and oriented to person, place, and time.  Psychiatric:        Mood and Affect: Mood normal.        Behavior: Behavior normal.        Thought Content: Thought content normal.        Judgment: Judgment normal.      BP 138/82 (BP Location: Left Arm, Patient Position: Sitting, Cuff Size: Normal)   Pulse 73   Temp 98.7 F (37.1 C) (Oral)   Resp 16   Ht 5\' 2"  (1.575 m)   Wt 143 lb (64.9 kg)   SpO2 99%   BMI 26.16 kg/m  Wt Readings from Last 3 Encounters:  05/13/23 143 lb (64.9 kg)  09/18/22 142 lb  (64.4 kg)  09/03/22 143 lb (64.9 kg)       Assessment & Plan:   Problem List Items Addressed This Visit       Unprioritized   Osteoporosis   Continue fosamax, calcium, dexa up to date. Recommended regular calcium intake.       Relevant Medications   alendronate (FOSAMAX) 70 MG tablet   HYPERTRIGLYCERIDEMIA   Lab Results  Component Value Date   CHOL 125 02/02/2022   HDL 53.30 02/02/2022   LDLCALC 45 02/02/2022   LDLDIRECT 64.9 12/17/2006   TRIG 135.0 02/02/2022   CHOLHDL 2 02/02/2022   Will update lipid panel, continue fenofibrate and statin.        Relevant Medications   losartan-hydrochlorothiazide (HYZAAR) 100-25 MG tablet   simvastatin (ZOCOR) 10 MG tablet   Essential hypertension   BP Readings from Last 3 Encounters:  05/13/23 (!) 144/70  09/18/22 122/75  09/03/22 137/78   Initial bp slightly elevated. Repeat bp at goal. Continue hyzaar.       Relevant Medications   losartan-hydrochlorothiazide (HYZAAR) 100-25 MG tablet   simvastatin (ZOCOR) 10 MG tablet   Other Relevant Orders   Comp Met (CMET)   Dyslipidemia   Relevant Medications   simvastatin (ZOCOR) 10 MG tablet   Other Relevant Orders   Lipid panel   Diabetic neuropathy (HCC)   Denies neuropathy pain in her feet.        Relevant Medications   losartan-hydrochlorothiazide (HYZAAR) 100-25 MG tablet   simvastatin (ZOCOR) 10 MG tablet   metFORMIN (GLUCOPHAGE)  1000 MG tablet   Diabetes type 2, controlled (HCC) - Primary   Lab Results  Component Value Date   HGBA1C 6.9 (H) 09/03/2022   HGBA1C 8.7 (H) 06/05/2022   HGBA1C 7.8 (H) 02/02/2022   Lab Results  Component Value Date   MICROALBUR 0.8 02/02/2022   LDLCALC 45 02/02/2022   CREATININE 1.02 09/18/2022   Continues metformin, update A1C and urine microalbumin.        Relevant Medications   losartan-hydrochlorothiazide (HYZAAR) 100-25 MG tablet   simvastatin (ZOCOR) 10 MG tablet   metFORMIN (GLUCOPHAGE) 1000 MG tablet   Other  Relevant Orders   HgB A1c   Urine Microalbumin w/creat. ratio   Aortic valve stenosis   Last echo 2022, clinically compensated. Update Echo.       Relevant Medications   losartan-hydrochlorothiazide (HYZAAR) 100-25 MG tablet   simvastatin (ZOCOR) 10 MG tablet   Other Relevant Orders   ECHOCARDIOGRAM COMPLETE    I am having [name] maintain her cholecalciferol, betamethasone dipropionate, Clobetasol Prop Emollient Base, Caltrate 600+D Plus Minerals, BLOOD GLUCOSE TEST STRIPS, fenofibrate, losartan-hydrochlorothiazide, simvastatin, alendronate, and metFORMIN.  Meds ordered this encounter  Medications   losartan-hydrochlorothiazide (HYZAAR) 100-25 MG tablet    Sig: Take 1 tablet by mouth daily.    Dispense:  90 tablet    Refill:  1    Supervising Provider:   Danise Edge A [[id number]]   simvastatin (ZOCOR) 10 MG tablet    Sig: Take 1 tablet (10 mg total) by mouth at bedtime.    Dispense:  90 tablet    Refill:  1    Supervising Provider:   Danise Edge A [[id number]]   alendronate (FOSAMAX) 70 MG tablet    Sig: Take 1 tablet (70 mg total) by mouth once a week. Take with a full glass of water on an empty stomach in AM, sit upright for 90 minutes after taking    Dispense:  12 tablet    Refill:  4    Supervising Provider:   Danise Edge A [[id number]]   metFORMIN (GLUCOPHAGE) 1000 MG tablet    Sig: Take 1 tablet (1,000 mg total) by mouth 2 (two) times daily with a meal.    Dispense:  180 tablet    Refill:  1    Supervising Provider:   Danise Edge A [[id number]]

## 2023-05-13 NOTE — Assessment & Plan Note (Addendum)
Lab Results  Component Value Date   HGBA1C 6.9 (H) 09/03/2022   HGBA1C 8.7 (H) 06/05/2022   HGBA1C 7.8 (H) 02/02/2022   Lab Results  Component Value Date   MICROALBUR 0.8 02/02/2022   LDLCALC 45 02/02/2022   CREATININE 1.02 09/18/2022   Continues metformin, update A1C and urine microalbumin.

## 2023-05-13 NOTE — Assessment & Plan Note (Signed)
Denies neuropathy pain in her feet.

## 2023-05-13 NOTE — Assessment & Plan Note (Signed)
Lab Results  Component Value Date   CHOL 125 02/02/2022   HDL 53.30 02/02/2022   LDLCALC 45 02/02/2022   LDLDIRECT 64.9 12/17/2006   TRIG 135.0 02/02/2022   CHOLHDL 2 02/02/2022   Will update lipid panel, continue fenofibrate and statin.

## 2023-05-13 NOTE — Assessment & Plan Note (Addendum)
Continue fosamax, calcium, dexa up to date. Recommended regular calcium intake.

## 2023-05-13 NOTE — Telephone Encounter (Signed)
Electronic request sent

## 2023-05-14 ENCOUNTER — Telehealth: Payer: Self-pay | Admitting: Family

## 2023-05-14 MED ORDER — PIOGLITAZONE HCL 30 MG PO TABS: 30.0000 mg | ORAL_TABLET | Freq: Every day | ORAL | 1 refills | Status: DC

## 2023-05-14 NOTE — Telephone Encounter (Signed)
A1C has risen.  Lab Results  Component Value Date   HGBA1C 7.8 (H) 05/13/2023   HGBA1C 6.9 (H) 09/03/2022   HGBA1C 8.7 (H) 06/05/2022   I would recommend that she add actos once daily in addition to working on her diet.   Cholesterol looks good.

## 2023-05-16 NOTE — Telephone Encounter (Signed)
Information given to patient's daughter on DPR. She will give all the information to patient.

## 2023-06-06 ENCOUNTER — Ambulatory Visit (HOSPITAL_BASED_OUTPATIENT_CLINIC_OR_DEPARTMENT_OTHER)
Admission: RE | Admit: 2023-06-06 | Discharge: 2023-06-06 | Disposition: A | Discharge status: Home / Self Care | Payer: Medicare Other | Source: Ambulatory Visit | Attending: Family | Admitting: Family

## 2023-06-06 DIAGNOSIS — I350 Nonrheumatic aortic (valve) stenosis: Secondary | ICD-10-CM | POA: Diagnosis not present

## 2023-06-06 LAB — ECHOCARDIOGRAM COMPLETE
AR max vel: 1.11 cm2
AV Area VTI: 1.22 cm2
AV Area mean vel: 1.05 cm2
AV Mean grad: 10.7 mm[Hg]
AV Peak grad: 20 mm[Hg]
AV Vena cont: 0.2 cm
Ao pk vel: 2.23 m/s
Area-P 1/2: 2.16 cm2
Calc EF: 66.4 %
P 1/2 time: 2509 ms
S' Lateral: 2.4 cm
Single Plane A2C EF: 71.9 %
Single Plane A4C EF: 59 %

## 2023-06-20 ENCOUNTER — Other Ambulatory Visit: Payer: Self-pay | Admitting: Family

## 2023-08-14 ENCOUNTER — Ambulatory Visit: Payer: Medicare Other | Admitting: Family

## 2023-08-14 ENCOUNTER — Encounter: Payer: Self-pay | Admitting: Family

## 2023-08-14 ENCOUNTER — Telehealth: Payer: Self-pay | Admitting: Family

## 2023-08-14 VITALS — BP 135/65 | HR 70 | Temp 97.4°F | Resp 16 | Ht 62.0 in | Wt 148.0 lb

## 2023-08-14 DIAGNOSIS — E781 Pure hyperglyceridemia: Secondary | ICD-10-CM | POA: Diagnosis not present

## 2023-08-14 DIAGNOSIS — I350 Nonrheumatic aortic (valve) stenosis: Secondary | ICD-10-CM | POA: Diagnosis not present

## 2023-08-14 DIAGNOSIS — I10 Essential (primary) hypertension: Secondary | ICD-10-CM | POA: Diagnosis not present

## 2023-08-14 DIAGNOSIS — E1129 Type 2 diabetes mellitus with other diabetic kidney complication: Secondary | ICD-10-CM

## 2023-08-14 DIAGNOSIS — M810 Age-related osteoporosis without current pathological fracture: Secondary | ICD-10-CM | POA: Diagnosis not present

## 2023-08-14 DIAGNOSIS — Z7984 Long term (current) use of oral hypoglycemic drugs: Secondary | ICD-10-CM | POA: Diagnosis not present

## 2023-08-14 DIAGNOSIS — R809 Proteinuria, unspecified: Secondary | ICD-10-CM | POA: Diagnosis not present

## 2023-08-14 DIAGNOSIS — F32A Depression, unspecified: Secondary | ICD-10-CM

## 2023-08-14 LAB — BASIC METABOLIC PANEL WITH GFR
BUN: 26 mg/dL — ABNORMAL HIGH (ref 6–23)
CO2: 28 meq/L (ref 19–32)
Calcium: 10 mg/dL (ref 8.4–10.5)
Chloride: 101 meq/L (ref 96–112)
Creatinine, Ser: 1.05 mg/dL (ref 0.40–1.20)
GFR: 55.04 mL/min — ABNORMAL LOW (ref >60.00)
Glucose, Bld: 114 mg/dL — ABNORMAL HIGH (ref 70–99)
Potassium: 4.3 meq/L (ref 3.5–5.1)
Sodium: 139 meq/L (ref 135–145)

## 2023-08-14 LAB — HEMOGLOBIN A1C: Hgb A1c MFr Bld: 7.4 % — ABNORMAL HIGH (ref 4.6–6.5)

## 2023-08-14 MED ORDER — SITAGLIPTIN PHOSPHATE 100 MG PO TABS: 100.0000 mg | ORAL_TABLET | Freq: Every day | ORAL | 5 refills | Status: DC

## 2023-08-14 NOTE — Assessment & Plan Note (Signed)
 Lab Results  Component Value Date   CHOL 141 05/13/2023   HDL 54.90 05/13/2023   LDLCALC 64 05/13/2023   LDLDIRECT 64.9 12/17/2006   TRIG 110.0 05/13/2023   CHOLHDL 3 05/13/2023   Stable on simvastatin .

## 2023-08-14 NOTE — Progress Notes (Signed)
 Subjective:     Patient ID: Katherine Kelly, female    DOB: [birth year], 68 y.o.   MRN: [id number]  Chief Complaint  Patient presents with   Diabetes    Here for follow up     Diabetes    Discussed the use of AI scribe software for clinical note transcription with the patient, who gave verbal consent to proceed.  History of Present Illness  Katherine Kelly is a 68 year old female who presents for a routine medication check.  Her hemoglobin A1c has increased from 6.9% to 7.8%. She is taking metformin  and pioglitazone  for diabetes management. Januvia  was considered but is too expensive. Simvastatin  is used for cholesterol management, which was well-controlled at her last visit. Blood pressure is managed with losartan  and hydrochlorothiazide , with a recent reading of 135/65 mmHg. She continues Fosamax  weekly for bone health, started approximately two years ago. An ultrasound showed a stiff aortic valve, but she does not follow with a cardiologist. Allergies are manageable, and her mood is good. She had an eye exam last year, but does not recall the date or location.     Health Maintenance Due  Topic Date Due   DTaP/Tdap/Td (2 - Td or Tdap) 12/21/2020   Zoster Vaccines- Shingrix (2 of 2) 12/27/2020   OPHTHALMOLOGY EXAM  03/28/2023   Medicare Annual Wellness (AWV)  06/14/2023    Past Medical History:  Diagnosis Date   ACE-inhibitor cough    ALLERGIC RHINITIS 01/03/2009   Qualifier: Diagnosis of  By: Marthe Slain    Aortic valve stenosis 08/30/2020   Arthritis    hands   Cataract, bilateral    COVID-19 vaccine series declined 11/01/2020   Diabetes mellitus, type 2 (HCC) 09/2006   on meds   Diabetes type 2, controlled (HCC) 11/27/2006   Qualifier: Diagnosis of  By: Washington Hacker MD, Sean A    Diabetic neuropathy (HCC) 08/31/2013   on meds   Eczema 09/14/2014   Essential hypertension 11/27/2006   Qualifier: Diagnosis of  By: Washington Hacker MD, Kaaren Ora A    History of colonic polyps     Hypertension    on meds   Osteoporosis    RHINOSINUSITIS, CHRONIC 07/07/2009   Qualifier: Diagnosis of  By: Marthe Slain     Past Surgical History:  Procedure Laterality Date   BELPHAROPTOSIS REPAIR Bilateral    BREAST BIOPSY Left    stereo tatic   CARDIOVASCULAR STRESS TEST  11/08/2006   CESAREAN SECTION  1980 & 1982   COLONOSCOPY  2014   JP-MAC-mov(good)-TA   ESOPHAGOGASTRODUODENOSCOPY  04/2006   POLYPECTOMY  2014   TA    Family History  Problem Relation Age of Onset   Colon polyps Sister 76   Colon polyps Brother 13   Colon cancer Neg Hx    Rectal cancer Neg Hx    Stomach cancer Neg Hx    Esophageal cancer Neg Hx     Social History   Socioeconomic History   Marital status: Married    Spouse name: Not on file   Number of children: Not on file   Years of education: Not on file   Highest education level: Not on file  Occupational History   Not on file  Tobacco Use   Smoking status: Never   Smokeless tobacco: Never  Vaping Use   Vaping status: Never Used  Substance and Sexual Activity   Alcohol use: No   Drug use: No   Sexual  activity: Not on file  Other Topics Concern   Not on file  Social History Narrative   Lives with husband and daughter   Production designer, 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[employer]   No pets   Did not complete high school   Enjoys travel and shopping   Social Drivers of Health   Financial Resource Strain: Low Risk  (06/13/2022)   Overall Financial Resource Strain (CARDIA)    Difficulty of Paying Living Expenses: Not hard at all  Food Insecurity: No Food Insecurity (06/13/2022)   Hunger Vital Sign    Worried About Running Out of Food in the Last Year: Never true    Ran Out of Food in the Last Year: Never true  Transportation Needs: No Transportation Needs (06/13/2022)   PRAPARE - Administrator, Civil Service (Medical): No    Lack of Transportation (Non-Medical): No  Physical Activity: Not on file  Stress: No Stress Concern Present (06/13/2022)    [employer] of Occupational Health - Occupational Stress Questionnaire    Feeling of Stress : Not at all  Social Connections: Moderately Integrated (06/13/2022)   Social Connection and Isolation Panel [NHANES]    Frequency of Communication with Friends and Family: More than three times a week    Frequency of Social Gatherings with Friends and Family: Once a week    Attends Religious Services: More than 4 times per year    Active Member of [employer] or Organizations: No    Attends Banker Meetings: Never    Marital Status: Married  Catering manager Violence: Not At Risk (06/13/2022)   Humiliation, Afraid, Rape, and Kick questionnaire    Fear of Current or Ex-Partner: No    Emotionally Abused: No    Physically Abused: No    Sexually Abused: No    Outpatient Medications Prior to Visit  Medication Sig Dispense Refill   alendronate  (FOSAMAX ) 70 MG tablet Take 1 tablet (70 mg total) by mouth once a week. Take with a full glass of water on an empty stomach in AM, sit upright for 90 minutes after taking 12 tablet 4   betamethasone  dipropionate (DIPROLENE ) 0.05 % cream Apply 1 application topically daily as needed. 30 g 1   Calcium Carbonate-Vit D-Min (CALTRATE 600+D PLUS MINERALS) 600-800 MG-UNIT CHEW Chew 1 tablet by mouth 2 (two) times daily. 60 tablet    cholecalciferol (VITAMIN D ) 1000 UNITS tablet Take 1,000 Units by mouth daily. Reported on 10/25/2015     Clobetasol  Prop Emollient Base 0.05 % emollient cream Apply 1 application. topically 2 (two) times daily as needed. 30 g 1   fenofibrate  160 MG tablet Take 1 tablet (160 mg total) by mouth daily. 90 tablet 1   Glucose Blood (BLOOD GLUCOSE TEST STRIPS) STRP Check blood sugars no more than twice daily 100 strip 12   losartan -hydrochlorothiazide  (HYZAAR ) 100-25 MG tablet Take 1 tablet by mouth daily. 90 tablet 1   metFORMIN  (GLUCOPHAGE ) 1000 MG tablet Take 1 tablet (1,000 mg total) by mouth 2 (two) times daily with a meal. 180  tablet 1   pioglitazone  (ACTOS ) 30 MG tablet Take 1 tablet (30 mg total) by mouth daily. 90 tablet 1   simvastatin  (ZOCOR ) 10 MG tablet Take 1 tablet (10 mg total) by mouth at bedtime. 90 tablet 1   No facility-administered medications prior to visit.    Allergies  Allergen Reactions   Aspirin Other (See Comments)    unknown   Ace Inhibitors     weakness  ROS     Objective:    Physical Exam Constitutional:      General: She is not in acute distress.    Appearance: Normal appearance. She is well-developed.  HENT:     Head: Normocephalic and atraumatic.     Right Ear: External ear normal.     Left Ear: External ear normal.  Eyes:     General: No scleral icterus. Neck:     Thyroid : No thyromegaly.  Cardiovascular:     Rate and Rhythm: Normal rate and regular rhythm.     Heart sounds: Normal heart sounds. No murmur heard. Pulmonary:     Effort: Pulmonary effort is normal. No respiratory distress.     Breath sounds: Normal breath sounds. No wheezing.  Musculoskeletal:     Cervical back: Neck supple.  Skin:    General: Skin is warm and dry.  Neurological:     Mental Status: She is alert and oriented to person, place, and time.  Psychiatric:        Mood and Affect: Mood normal.        Behavior: Behavior normal.        Thought Content: Thought content normal.        Judgment: Judgment normal.      BP 135/65 (Cuff Size: Normal)   Pulse 70   Temp (!) 97.4 F (36.3 C) (Oral)   Resp 16   Ht 5\' 2"  (1.575 m)   Wt 148 lb (67.1 kg)   SpO2 98%   BMI 27.07 kg/m  Wt Readings from Last 3 Encounters:  08/14/23 148 lb (67.1 kg)  05/13/23 143 lb (64.9 kg)  09/18/22 142 lb (64.4 kg)       Assessment & Plan:   Problem List Items Addressed This Visit       Unprioritized   Osteoporosis   She continues fosamax  once a week.      HYPERTRIGLYCERIDEMIA   Lab Results  Component Value Date   CHOL 141 05/13/2023   HDL 54.90 05/13/2023   LDLCALC 64 05/13/2023    LDLDIRECT 64.9 12/17/2006   TRIG 110.0 05/13/2023   CHOLHDL 3 05/13/2023   Stable on simvastatin .       Essential hypertension   Bp stable on hyzaar . Continue same.       Diabetes type 2, controlled (HCC) - Primary   Lab Results  Component Value Date   HGBA1C 7.8 (H) 05/13/2023   HGBA1C 6.9 (H) 09/03/2022   HGBA1C 8.7 (H) 06/05/2022   Lab Results  Component Value Date   MICROALBUR <0.7 05/13/2023   LDLCALC 64 05/13/2023   CREATININE 0.96 05/13/2023   Restart januvia  if affordable. Continue metformin  and actos .      Relevant Medications   sitaGLIPtin  (JANUVIA ) 100 MG tablet   Other Relevant Orders   HgB A1c   Basic Metabolic Panel (BMET)   Depression   Reports stable mood without medication.       Aortic valve stenosis   Relevant Orders   Ambulatory referral to Cardiology    I am having Dessa Floss start on sitaGLIPtin . I am also having her maintain her cholecalciferol, betamethasone  dipropionate, Clobetasol  Prop Emollient Base, Caltrate 600+D Plus Minerals, BLOOD GLUCOSE TEST STRIPS, fenofibrate , losartan -hydrochlorothiazide , simvastatin , alendronate , metFORMIN , and pioglitazone .  Meds ordered this encounter  Medications   sitaGLIPtin  (JANUVIA ) 100 MG tablet    Sig: Take 1 tablet (100 mg total) by mouth daily.    Dispense:  30 tablet    Refill:  5    Supervising Provider:   Randie Bustle A [phone]

## 2023-08-14 NOTE — Assessment & Plan Note (Addendum)
 Lab Results  Component Value Date   HGBA1C 7.8 (H) 05/13/2023   HGBA1C 6.9 (H) 09/03/2022   HGBA1C 8.7 (H) 06/05/2022   Lab Results  Component Value Date   MICROALBUR <0.7 05/13/2023   LDLCALC 64 05/13/2023   CREATININE 0.96 05/13/2023   Restart januvia  if affordable. Continue metformin  and actos .

## 2023-08-14 NOTE — Telephone Encounter (Signed)
 Please request eye exam from my eye doctor in [place], she thinks off of [address].

## 2023-08-14 NOTE — Telephone Encounter (Signed)
 Electronic request made

## 2023-08-14 NOTE — Patient Instructions (Signed)
 VISIT SUMMARY:  Today, you came in for a routine medication check. We reviewed your current medications and discussed your recent lab results. Your blood pressure is well-controlled, and your mood is stable. We also talked about your diabetes management and the need for a cardiology follow-up.  YOUR PLAN:  -WELLNESS VISIT: This visit was to review your overall health and manage your medications. Your blood pressure is well-controlled, and your mood is stable. We will continue your current medications for hypertension, diabetes, and cholesterol. We will also perform lab tests to check your A1c and kidney function, and ensure all your medications are up to date for refills.  -TYPE 2 DIABETES MELLITUS: Your A1c level, which measures your blood sugar control, has increased to 7.8%, which is above the target range. We discussed the cost of Januvia  and will send a prescription to see if it is affordable for you. We will monitor your A1c and kidney function with lab tests.  -ESSENTIAL (PRIMARY) HYPERTENSION: Your blood pressure is well-controlled with your current medications, losartan  and hydrochlorothiazide . We will continue with this treatment plan.  -HYPERLIPIDEMIA: Your cholesterol levels are well-controlled with simvastatin . We will continue with your current dose of this medication.  -AORTIC VALVE DISORDER: You have a stiff aortic valve, which is a condition that affects the valve in your heart. We recommend that you follow up with a cardiologist for further evaluation.  -OSTEOPOROSIS WITHOUT CURRENT PATHOLOGICAL FRACTURE: You have osteoporosis, a condition that weakens your bones. You have been taking Fosamax  for two years to help strengthen your bones. We will continue this medication once a week.  INSTRUCTIONS:  Please follow up with a cardiologist for further evaluation of your aortic valve. We will also need to perform lab tests to monitor your A1c and kidney function. Make sure to keep all  your medications up to date for refills.

## 2023-08-14 NOTE — Assessment & Plan Note (Signed)
 Reports stable mood without medication.

## 2023-08-14 NOTE — Assessment & Plan Note (Signed)
 Bp stable on hyzaar . Continue same.

## 2023-08-14 NOTE — Assessment & Plan Note (Signed)
 She continues fosamax  once a week.

## 2023-08-15 ENCOUNTER — Ambulatory Visit

## 2023-09-02 DIAGNOSIS — M1990 Unspecified osteoarthritis, unspecified site: Secondary | ICD-10-CM | POA: Insufficient documentation

## 2023-09-03 ENCOUNTER — Ambulatory Visit

## 2023-09-03 VITALS — BP 118/78 | HR 78 | Ht 60.0 in | Wt 149.1 lb

## 2023-09-03 DIAGNOSIS — I350 Nonrheumatic aortic (valve) stenosis: Secondary | ICD-10-CM

## 2023-09-03 DIAGNOSIS — I10 Essential (primary) hypertension: Secondary | ICD-10-CM | POA: Diagnosis not present

## 2023-09-03 NOTE — Assessment & Plan Note (Signed)
 Stable findings with aortic stenosis appears mild to moderate with V-max 2.2 m/s on echocardiogram February 2025 with mean gradient 10.7 mmHg and valve area 1.2 cm.  Denies any symptoms attributable to aortic stenosis at this time. Continue monitoring, repeat echocardiogram transthoracic tentatively in 18 months prior to next follow-up visit.

## 2023-09-03 NOTE — Patient Instructions (Signed)
 Medication Instructions:  Your physician recommends that you continue on your current medications as directed. Please refer to the Current Medication list given to you today.  *If you need a refill on your cardiac medications before your next appointment, please call your pharmacy*  Lab Work: None If you have labs (blood work) drawn today and your tests are completely normal, you will receive your results only by: MyChart Message (if you have MyChart) OR A paper copy in the mail If you have any lab test that is abnormal or we need to change your treatment, we will call you to review the results.  Testing/Procedures: Your physician has requested that you have an echocardiogram. Echocardiography is a painless test that uses sound waves to create images of your heart. It provides your doctor with information about the size and shape of your heart and how well your heart's chambers and valves are working. This procedure takes approximately one hour. There are no restrictions for this procedure. Please do NOT wear cologne, perfume, aftershave, or lotions (deodorant is allowed). Please arrive 15 minutes prior to your appointment time.  Please note: We ask at that you not bring children with you during ultrasound (echo/ vascular) testing. Due to room size and safety concerns, children are not allowed in the ultrasound rooms during exams. Our front office staff cannot provide observation of children in our lobby area while testing is being conducted. An adult accompanying a patient to their appointment will only be allowed in the ultrasound room at the discretion of the ultrasound technician under special circumstances. We apologize for any inconvenience.   Follow-Up: At [facility], you and your health needs are our priority.  As part of our continuing mission to provide you with exceptional heart care, our providers are all part of one team.  This team includes your primary Cardiologist  (physician) and Advanced Practice Providers or APPs (Physician Assistants and Nurse Practitioners) who all work together to provide you with the care you need, when you need it.  Your next appointment:   18  month(s)  Provider:   Bertha Broad, MD    We recommend signing up for the patient portal called "MyChart".  Sign up information is provided on this After Visit Summary.  MyChart is used to connect with patients for Virtual Visits (Telemedicine).  Patients are able to view lab/test results, encounter notes, upcoming appointments, etc.  Non-urgent messages can be sent to your provider as well.   To learn more about what you can do with MyChart, go to [email or web address].   Other Instructions: None

## 2023-09-03 NOTE — Progress Notes (Signed)
 Cardiology Consultation:    Date:  09/03/2023   ID:  Katherine Kelly, DOB 1956-03-26, MRN [id number]  PCP:  Katherine Gaucher, NP  Cardiologist:  Katherine Kells, MD   Referring MD: Katherine Gaucher, NP   No chief complaint on file.    ASSESSMENT AND PLAN:   Katherine Kelly 68 year old woman speaks [place] and English with her daughter accompanying her to the visit helping with further translation. Has history of mild to moderate aortic stenosis, aortic root measuring 3.8 cm, hypertension, hyperlipidemia, diabetes mellitus now with recent echocardiogram February 2025 noting mild LVH with EF 60 to 65%, mild to moderate aortic stenosis with valve area 1.2 cm mean gradient 10.7 mmHg and V-max 2.2 m/s.  Problem List Items Addressed This Visit     Essential hypertension   Well-controlled. Target below 130/80 mmHg. Continue with current medication losartan -hydrochlorothiazide  100 mg - 25 mg once daily.       Relevant Orders   EKG 12-Lead (Completed)   Aortic valve stenosis - Primary   Stable findings with aortic stenosis appears mild to moderate with V-max 2.2 m/s on echocardiogram February 2025 with mean gradient 10.7 mmHg and valve area 1.2 cm.  Denies any symptoms attributable to aortic stenosis at this time. Continue monitoring, repeat echocardiogram transthoracic tentatively in 18 months prior to next follow-up visit.       Relevant Orders   EKG 12-Lead (Completed)   Return to clinic tentatively in 18 months with follow-up echocardiogram prior to the next visit.   History of Present Illness:    Katherine Kelly is a 68 y.o. female who is being seen today for follow-up visit. PCP is Katherine Gaucher, NP. Last visit with our office was with Dr. Gordan Kelly 04-04-2021.  Speaks [place] and also English to an extent.  Her daughter who accompanied her to the visit helped further with interpretation. Very pleasant woman here for the visit accompanied by her  daughter.  She lives with her husband at home.  Has history of mild to moderate aortic stenosis, aortic root measuring 3.8 cm, hypertension, hyperlipidemia, diabetes mellitus now with recent echocardiogram February 2025 noting mild LVH with EF 60 to 65%, mild to moderate aortic stenosis with valve area 1.2 cm mean gradient 10.7 mmHg and V-max 2.2 m/s  Overall she reports to be doing well at baseline no significant change from her functional status compared to 2 years ago. Continues to exercise occasionally at home on the treadmill.  Keeps busy with day-to-day activities.  Denies any chest pain, shortness of breath, orthopnea, paroxysmal nocturnal dyspnea.  No pedal edema.  No palpitations or syncopal episodes. No falls.  Good compliance with medications at home. Mentions blood pressures at home typically well-controlled with systolic below 130s for the most part.   Blood work from 08/14/2023 noted hemoglobin A1c 7.4 Sodium 139, potassium 4.3 BUN 26, creatinine 1.05, EGFR 55 suggest CKD stage III. Lipid pane 05-13-2023 l with total cholesterol 141, HDL 54, LDL 64, triglycerides 110. TSH from June 2024 normal 1.28.   EKG in the clinic today shows sinus rhythm heart rate 78/min, PR interval 174 ms, QRS duration 86 ms, nonspecific T wave changes in inferolateral leads. Past Medical History:  Diagnosis Date   ACE-inhibitor cough    ALLERGIC RHINITIS 01/03/2009   Qualifier: Diagnosis of  By: Marthe Slain    ALLERGIC RHINITIS 01/03/2009   Qualifier: Diagnosis of   By: Marthe Slain     Replacing diagnoses that were  inactivated after the 07/16/22 regulatory import     Annual physical exam 12/22/2010   Aortic valve stenosis 08/30/2020   Arthritis    hands   ASCUS of cervix with negative high risk HPV 03/20/2017   11/2016  Per guidelines, repeat cytology and HPV testing 3 years.     Cataract, bilateral    COVID-19 vaccine series declined 11/01/2020   Depression 08/09/2014   Diabetes  mellitus, type 2 (HCC) 09/2006   on meds   Diabetes type 2, controlled (HCC) 11/27/2006   Qualifier: Diagnosis of  By: Washington Hacker MD, Sean A    Diabetic neuropathy (HCC) 08/31/2013   on meds   Dyslipidemia 10/07/2020   Eczema 09/14/2014   Essential hypertension 11/27/2006   Qualifier: Diagnosis of  By: Washington Hacker MD, Kaaren Ora A    History of colonic polyps    History of motion sickness 02/02/2022   Hypertension    on meds   HYPERTRIGLYCERIDEMIA 02/18/2007   Qualifier: Diagnosis of   By: Marthe Slain        Osteoporosis    RHINOSINUSITIS, CHRONIC 07/07/2009   Qualifier: Diagnosis of  By: Marthe Slain     Past Surgical History:  Procedure Laterality Date   BELPHAROPTOSIS REPAIR Bilateral    BREAST BIOPSY Left    stereo tatic   CARDIOVASCULAR STRESS TEST  11/08/2006   CESAREAN SECTION  1980 & 1982   COLONOSCOPY  2014   JP-MAC-mov(good)-TA   ESOPHAGOGASTRODUODENOSCOPY  04/2006   POLYPECTOMY  2014   TA    Current Medications: Current Meds  Medication Sig   alendronate  (FOSAMAX ) 70 MG tablet Take 1 tablet (70 mg total) by mouth once a week. Take with a full glass of water on an empty stomach in AM, sit upright for 90 minutes after taking   betamethasone  dipropionate (DIPROLENE ) 0.05 % cream Apply 1 application topically daily as needed.   Calcium Carbonate-Vit D-Min (CALTRATE 600+D PLUS MINERALS) 600-800 MG-UNIT CHEW Chew 1 tablet by mouth 2 (two) times daily.   cholecalciferol (VITAMIN D ) 1000 UNITS tablet Take 1,000 Units by mouth daily. Reported on 10/25/2015   Clobetasol  Prop Emollient Base 0.05 % emollient cream Apply 1 application. topically 2 (two) times daily as needed.   fenofibrate  160 MG tablet Take 1 tablet (160 mg total) by mouth daily.   Glucose Blood (BLOOD GLUCOSE TEST STRIPS) STRP Check blood sugars no more than twice daily   losartan -hydrochlorothiazide  (HYZAAR ) 100-25 MG tablet Take 1 tablet by mouth daily.   metFORMIN  (GLUCOPHAGE ) 1000 MG tablet Take 1  tablet (1,000 mg total) by mouth 2 (two) times daily with a meal.   pioglitazone  (ACTOS ) 30 MG tablet Take 1 tablet (30 mg total) by mouth daily.   simvastatin  (ZOCOR ) 10 MG tablet Take 1 tablet (10 mg total) by mouth at bedtime.   sitaGLIPtin  (JANUVIA ) 100 MG tablet Take 1 tablet (100 mg total) by mouth daily.     Allergies:   Aspirin and Ace inhibitors   Social History   Socioeconomic History   Marital status: Married    Spouse name: Not on file   Number of children: Not on file   Years of education: Not on file   Highest education level: Not on file  Occupational History   Not on file  Tobacco Use   Smoking status: Never   Smokeless tobacco: Never  Vaping Use   Vaping status: Never Used  Substance and Sexual Activity   Alcohol use: No   Drug  use: No   Sexual activity: Not on file  Other Topics Concern   Not on file  Social History Narrative   Lives with husband and daughter   Production designer, 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[employer]   No pets   Did not complete high school   Enjoys travel and shopping   Social Drivers of Health   Financial Resource Strain: Low Risk  (06/13/2022)   Overall Financial Resource Strain (CARDIA)    Difficulty of Paying Living Expenses: Not hard at all  Food Insecurity: No Food Insecurity (06/13/2022)   Hunger Vital Sign    Worried About Running Out of Food in the Last Year: Never true    Ran Out of Food in the Last Year: Never true  Transportation Needs: No Transportation Needs (06/13/2022)   PRAPARE - Administrator, Civil Service (Medical): No    Lack of Transportation (Non-Medical): No  Physical Activity: Not on file  Stress: No Stress Concern Present (06/13/2022)   [employer] of Occupational Health - Occupational Stress Questionnaire    Feeling of Stress : Not at all  Social Connections: Moderately Integrated (06/13/2022)   Social Connection and Isolation Panel [NHANES]    Frequency of Communication with Friends and Family: More than three times  a week    Frequency of Social Gatherings with Friends and Family: Once a week    Attends Religious Services: More than 4 times per year    Active Member of [employer] or Organizations: No    Attends Banker Meetings: Never    Marital Status: Married     Family History: The patient's family history includes Colon polyps (age of onset: 74) in her brother and sister. There is no history of Colon cancer, Rectal cancer, Stomach cancer, or Esophageal cancer. ROS:   Please see the history of present illness.    All 14 point review of systems negative except as described per history of present illness.  EKGs/Labs/Other Studies Reviewed:    The following studies were reviewed today:   EKG:       Recent Labs: 09/18/2022: Hemoglobin 13.1; Platelets 316.0; TSH 1.28 05/13/2023: ALT 18 08/14/2023: BUN 26; Creatinine, Ser 1.05; Potassium 4.3; Sodium 139  Recent Lipid Panel    Component Value Date/Time   CHOL 141 05/13/2023 1013   TRIG 110.0 05/13/2023 1013   HDL 54.90 05/13/2023 1013   CHOLHDL 3 05/13/2023 1013   VLDL 22.0 05/13/2023 1013   LDLCALC 64 05/13/2023 1013   LDLCALC 53 06/02/2021 1608   LDLDIRECT 64.9 12/17/2006 1127    Physical Exam:    VS:  BP 118/78   Pulse 78   Ht 5' (1.524 m)   Wt 149 lb 1.9 oz (67.6 kg)   SpO2 97%   BMI 29.12 kg/m     Wt Readings from Last 3 Encounters:  09/03/23 149 lb 1.9 oz (67.6 kg)  08/14/23 148 lb (67.1 kg)  05/13/23 143 lb (64.9 kg)     GENERAL:  Well nourished, well developed in no acute distress NECK: No JVD; No carotid bruits CARDIAC: RRR, S1 and S2 present, 4/6 ejection systolic murmur best heard right upper sternal border.  CHEST:  Clear to auscultation without rales, wheezing or rhonchi  Extremities: No pitting pedal edema. Pulses bilaterally symmetric with radial 2+ and dorsalis pedis 2+ NEUROLOGIC:  Alert and oriented x 3  Medication Adjustments/Labs and Tests Ordered: Current medicines are reviewed at length with the  patient today.  Concerns regarding medicines are outlined above.  Orders Placed This Encounter  Procedures   EKG 12-Lead   No orders of the defined types were placed in this encounter.   Signed, Nolen Lindamood reddy Ichael Pullara, MD, MPH, [facility]. 09/03/2023 3:18 PM    [facility]

## 2023-09-03 NOTE — Assessment & Plan Note (Signed)
 Well-controlled. Target below 130/80 mmHg. Continue with current medication losartan -hydrochlorothiazide  100 mg - 25 mg once daily.

## 2023-09-19 ENCOUNTER — Other Ambulatory Visit: Payer: Self-pay | Admitting: Family

## 2023-09-19 ENCOUNTER — Encounter: Payer: Self-pay | Admitting: Family

## 2023-09-19 MED ORDER — ONETOUCH ULTRASOFT LANCETS MISC: 12 refills | Status: AC

## 2023-09-19 MED ORDER — BLOOD GLUCOSE TEST VI STRP: ORAL_STRIP | 12 refills | Status: DC

## 2023-09-19 NOTE — Telephone Encounter (Signed)
 Copied from CRM [id number]. Topic: Clinical - Medication Refill >> Sep 19, 2023 12:46 PM Trula Gable C wrote: Medication: Glucose Blood (BLOOD GLUCOSE TEST STRIPS) STRP and Needles   Has the patient contacted their pharmacy? Yes (Agent: If no, request that the patient contact the pharmacy for the refill. If patient does not wish to contact the pharmacy document the reason why and proceed with request.) (Agent: If yes, when and what did the pharmacy advise?)  This is the patient's preferred pharmacy:  [facility] [address], [place] - [address] [place] [address] Phone: [phone] Fax: [phone]  Is this the correct pharmacy for this prescription? Yes If no, delete pharmacy and type the correct one.   Has the prescription been filled recently? No  Is the patient out of the medication? Yes  Has the patient been seen for an appointment in the last year OR does the patient have an upcoming appointment? Yes  Can we respond through MyChart? Yes  Agent: Please be advised that Rx refills may take up to 3 business days. We ask that you follow-up with your pharmacy.

## 2023-10-08 ENCOUNTER — Ambulatory Visit (INDEPENDENT_AMBULATORY_CARE_PROVIDER_SITE_OTHER): Admitting: Family

## 2023-10-08 VITALS — BP 131/69 | HR 76 | Temp 99.0°F | Resp 16 | Ht 60.0 in | Wt 150.0 lb

## 2023-10-08 DIAGNOSIS — R109 Unspecified abdominal pain: Secondary | ICD-10-CM

## 2023-10-08 LAB — POCT URINALYSIS DIP (MANUAL ENTRY)
Bilirubin, UA: NEGATIVE
Blood, UA: NEGATIVE
Glucose, UA: NEGATIVE mg/dL
Ketones, POC UA: NEGATIVE mg/dL
Nitrite, UA: NEGATIVE
Protein Ur, POC: NEGATIVE mg/dL
Spec Grav, UA: 1.01 (ref 1.010–1.025)
Urobilinogen, UA: 0.2 U/dL
pH, UA: 7 (ref 5.0–8.0)

## 2023-10-08 NOTE — Progress Notes (Signed)
 Subjective:     Patient ID: Katherine Kelly, female    DOB: October 07, 1955, 68 y.o.   MRN: [id number]  Chief Complaint  Patient presents with   Flank Pain    Patient complains of right flank pain w/o nausea     Flank Pain    Discussed the use of AI scribe software for clinical note transcription with the patient, who gave verbal consent to proceed.  History of Present Illness  Pt presents with her husband (who assists with translation) with complaint of abdominal pain. Started 3 weeks ago.  No fever, has chronic constipation issues but not worsen then her norm.  She drinks a chinese tea to help with her constipation. Husband says that pain is much better today than it has been.      Health Maintenance Due  Topic Date Due   DTaP/Tdap/Td (2 - Td or Tdap) 12/21/2020   Zoster Vaccines- Shingrix (2 of 2) 12/27/2020   OPHTHALMOLOGY EXAM  03/28/2023   Medicare Annual Wellness (AWV)  06/14/2023   FOOT EXAM  09/03/2023    Past Medical History:  Diagnosis Date   ACE-inhibitor cough    ALLERGIC RHINITIS 01/03/2009   Qualifier: Diagnosis of  By: Georgian ROSALEA CHARM Lamar    ALLERGIC RHINITIS 01/03/2009   Qualifier: Diagnosis of   By: Georgian ROSALEA CHARM Lamar     Replacing diagnoses that were inactivated after the 07/16/22 regulatory import     Annual physical exam 12/22/2010   Aortic valve stenosis 08/30/2020   Arthritis    hands   ASCUS of cervix with negative high risk HPV 03/20/2017   11/2016  Per guidelines, repeat cytology and HPV testing 3 years.     Cataract, bilateral    COVID-19 vaccine series declined 11/01/2020   Depression 08/09/2014   Diabetes mellitus, type 2 (HCC) 09/2006   on meds   Diabetes type 2, controlled (HCC) 11/27/2006   Qualifier: Diagnosis of  By: Kassie MD, Sean A    Diabetic neuropathy (HCC) 08/31/2013   on meds   Dyslipidemia 10/07/2020   Eczema 09/14/2014   Essential hypertension 11/27/2006   Qualifier: Diagnosis of  By: Kassie MD, Alyce A    History of colonic  polyps    History of motion sickness 02/02/2022   Hypertension    on meds   HYPERTRIGLYCERIDEMIA 02/18/2007   Qualifier: Diagnosis of   By: Georgian ROSALEA CHARM Lamar        Osteoporosis    RHINOSINUSITIS, CHRONIC 07/07/2009   Qualifier: Diagnosis of  By: Georgian ROSALEA CHARM Lamar     Past Surgical History:  Procedure Laterality Date   BELPHAROPTOSIS REPAIR Bilateral    BREAST BIOPSY Left    stereo tatic   CARDIOVASCULAR STRESS TEST  11/08/2006   CESAREAN SECTION  1980 & 1982   COLONOSCOPY  2014   JP-MAC-mov(good)-TA   ESOPHAGOGASTRODUODENOSCOPY  04/2006   POLYPECTOMY  2014   TA    Family History  Problem Relation Age of Onset   Colon polyps Sister 69   Colon polyps Brother 48   Colon cancer Neg Hx    Rectal cancer Neg Hx    Stomach cancer Neg Hx    Esophageal cancer Neg Hx     Social History   Socioeconomic History   Marital status: Married    Spouse name: Not on file   Number of children: Not on file   Years of education: Not on file   Highest education level: Not on file  Occupational History   Not on file  Tobacco Use   Smoking status: Never   Smokeless tobacco: Never  Vaping Use   Vaping status: Never Used  Substance and Sexual Activity   Alcohol use: No   Drug use: No   Sexual activity: Not on file  Other Topics Concern   Not on file  Social History Narrative   Lives with husband and daughter   Production designer, 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[employer]   No pets   Did not complete high school   Enjoys travel and shopping   Social Drivers of Health   Financial Resource Strain: Low Risk  (06/13/2022)   Overall Financial Resource Strain (CARDIA)    Difficulty of Paying Living Expenses: Not hard at all  Food Insecurity: No Food Insecurity (06/13/2022)   Hunger Vital Sign    Worried About Running Out of Food in the Last Year: Never true    Ran Out of Food in the Last Year: Never true  Transportation Needs: No Transportation Needs (06/13/2022)   PRAPARE - Scientist, research (physical sciences) (Medical): No    Lack of Transportation (Non-Medical): No  Physical Activity: Not on file  Stress: No Stress Concern Present (06/13/2022)   [employer] of Occupational Health - Occupational Stress Questionnaire    Feeling of Stress : Not at all  Social Connections: Moderately Integrated (06/13/2022)   Social Connection and Isolation Panel    Frequency of Communication with Friends and Family: More than three times a week    Frequency of Social Gatherings with Friends and Family: Once a week    Attends Religious Services: More than 4 times per year    Active Member of [employer] or Organizations: No    Attends Banker Meetings: Never    Marital Status: Married  Catering manager Violence: Not At Risk (06/13/2022)   Humiliation, Afraid, Rape, and Kick questionnaire    Fear of Current or Ex-Partner: No    Emotionally Abused: No    Physically Abused: No    Sexually Abused: No    Outpatient Medications Prior to Visit  Medication Sig Dispense Refill   alendronate  (FOSAMAX ) 70 MG tablet Take 1 tablet (70 mg total) by mouth once a week. Take with a full glass of water on an empty stomach in AM, sit upright for 90 minutes after taking 12 tablet 4   betamethasone  dipropionate (DIPROLENE ) 0.05 % cream Apply 1 application topically daily as needed. 30 g 1   Calcium Carbonate-Vit D-Min (CALTRATE 600+D PLUS MINERALS) 600-800 MG-UNIT CHEW Chew 1 tablet by mouth 2 (two) times daily. 60 tablet    cholecalciferol (VITAMIN D ) 1000 UNITS tablet Take 1,000 Units by mouth daily. Reported on 10/25/2015     Clobetasol  Prop Emollient Base 0.05 % emollient cream Apply 1 application. topically 2 (two) times daily as needed. 30 g 1   fenofibrate  160 MG tablet Take 1 tablet (160 mg total) by mouth daily. 90 tablet 1   Glucose Blood (BLOOD GLUCOSE TEST STRIPS) STRP Check blood sugars no more than twice daily 100 strip 12   Lancets (ONETOUCH ULTRASOFT) lancets Check blood sugars twice daily  100 each 12   losartan -hydrochlorothiazide  (HYZAAR ) 100-25 MG tablet Take 1 tablet by mouth daily. 90 tablet 1   metFORMIN  (GLUCOPHAGE ) 1000 MG tablet Take 1 tablet (1,000 mg total) by mouth 2 (two) times daily with a meal. 180 tablet 1   pioglitazone  (ACTOS ) 30 MG tablet Take 1 tablet (30 mg total) by  mouth daily. 90 tablet 1   simvastatin  (ZOCOR ) 10 MG tablet Take 1 tablet (10 mg total) by mouth at bedtime. 90 tablet 1   sitaGLIPtin  (JANUVIA ) 100 MG tablet Take 1 tablet (100 mg total) by mouth daily. 30 tablet 5   No facility-administered medications prior to visit.    Allergies  Allergen Reactions   Aspirin Other (See Comments)    unknown   Ace Inhibitors     weakness    Review of Systems  Genitourinary:  Positive for flank pain.       Objective:    Physical Exam Constitutional:      General: She is not in acute distress.    Appearance: Normal appearance. She is well-developed.  HENT:     Head: Normocephalic and atraumatic.     Right Ear: External ear normal.     Left Ear: External ear normal.   Eyes:     General: No scleral icterus.  Neck:     Thyroid : No thyromegaly.   Cardiovascular:     Rate and Rhythm: Normal rate and regular rhythm.     Heart sounds: Normal heart sounds. No murmur heard. Pulmonary:     Effort: Pulmonary effort is normal. No respiratory distress.     Breath sounds: Normal breath sounds. No wheezing.  Abdominal:     General: There is no distension.     Palpations: Abdomen is soft.     Tenderness: There is abdominal tenderness in the right upper quadrant and right lower quadrant. There is no right CVA tenderness or left CVA tenderness.     Comments: No guarding, no rebound tenderness   Musculoskeletal:     Cervical back: Neck supple.   Skin:    General: Skin is warm and dry.   Neurological:     Mental Status: She is alert and oriented to person, place, and time.   Psychiatric:        Mood and Affect: Mood normal.        Behavior:  Behavior normal.        Thought Content: Thought content normal.        Judgment: Judgment normal.      BP 131/69 (BP Location: Right Arm, Patient Position: Sitting, Cuff Size: Normal)   Pulse 76   Temp 99 F (37.2 C) (Oral)   Resp 16   Ht 5' (1.524 m)   Wt 150 lb (68 kg)   SpO2 97%   BMI 29.29 kg/m  Wt Readings from Last 3 Encounters:  10/08/23 150 lb (68 kg)  09/03/23 149 lb 1.9 oz (67.6 kg)  08/14/23 148 lb (67.1 kg)       Assessment & Plan:   Problem List Items Addressed This Visit       Unprioritized   Abdominal pain, unspecified site   New.  Improving but not resolved.  Obtain CT abd/pelvis. Labs as ordered.  PT advised to go to ER if pain worsens.       Relevant Orders   Basic Metabolic Panel (BMET)   CBC w/Diff   CT ABDOMEN PELVIS W CONTRAST   Other Visit Diagnoses       Flank pain    -  Primary   Relevant Orders   POCT urinalysis dipstick (Completed)   Urine Culture       I am having Katherine Kelly maintain her cholecalciferol, betamethasone  dipropionate, Clobetasol  Prop Emollient Base, Caltrate 600+D Plus Minerals, fenofibrate , losartan -hydrochlorothiazide , simvastatin , alendronate , metFORMIN , pioglitazone , sitaGLIPtin , BLOOD GLUCOSE  TEST STRIPS, and onetouch ultrasoft.  No orders of the defined types were placed in this encounter.

## 2023-10-08 NOTE — Patient Instructions (Signed)
 You should be contacted about scheduling your CT tomorrow. Go to the ER if you develop worsening abdominal pain.

## 2023-10-08 NOTE — Assessment & Plan Note (Signed)
 New.  Improving but not resolved.  Obtain CT abd/pelvis. Labs as ordered.  PT advised to go to ER if pain worsens.

## 2023-10-09 LAB — CBC WITH DIFFERENTIAL/PLATELET
Basophils Absolute: 0.1 10*3/uL (ref 0.0–0.1)
Basophils Relative: 1.3 % (ref 0.0–3.0)
Eosinophils Absolute: 0.4 10*3/uL (ref 0.0–0.7)
Eosinophils Relative: 5.6 % — ABNORMAL HIGH (ref 0.0–5.0)
HCT: 39.6 % (ref 36.0–46.0)
Hemoglobin: 13.2 g/dL (ref 12.0–15.0)
Lymphocytes Relative: 23.5 % (ref 12.0–46.0)
Lymphs Abs: 1.8 10*3/uL (ref 0.7–4.0)
MCHC: 33.5 g/dL (ref 30.0–36.0)
MCV: 96.1 fl (ref 78.0–100.0)
Monocytes Absolute: 0.8 10*3/uL (ref 0.1–1.0)
Monocytes Relative: 10.4 % (ref 3.0–12.0)
Neutro Abs: 4.5 10*3/uL (ref 1.4–7.7)
Neutrophils Relative %: 59.2 % (ref 43.0–77.0)
Platelets: 296 10*3/uL (ref 150.0–400.0)
RBC: 4.12 Mil/uL (ref 3.87–5.11)
RDW: 12.7 % (ref 11.5–15.5)
WBC: 7.6 10*3/uL (ref 4.0–10.5)

## 2023-10-09 LAB — BASIC METABOLIC PANEL WITH GFR
BUN: 31 mg/dL — ABNORMAL HIGH (ref 6–23)
CO2: 27 meq/L (ref 19–32)
Calcium: 10.3 mg/dL (ref 8.4–10.5)
Chloride: 100 meq/L (ref 96–112)
Creatinine, Ser: 1.21 mg/dL — ABNORMAL HIGH (ref 0.40–1.20)
GFR: 46.37 mL/min — ABNORMAL LOW (ref >60.00)
Glucose, Bld: 119 mg/dL — ABNORMAL HIGH (ref 70–99)
Potassium: 4.4 meq/L (ref 3.5–5.1)
Sodium: 138 meq/L (ref 135–145)

## 2023-10-09 LAB — URINE CULTURE
MICRO NUMBER:: 16618301
Result:: NO GROWTH
SPECIMEN QUALITY:: ADEQUATE

## 2023-10-10 ENCOUNTER — Ambulatory Visit: Payer: Self-pay | Admitting: Family

## 2023-10-10 ENCOUNTER — Telehealth: Payer: Self-pay | Admitting: Family

## 2023-10-10 DIAGNOSIS — R21 Rash and other nonspecific skin eruption: Secondary | ICD-10-CM

## 2023-10-10 NOTE — Telephone Encounter (Signed)
 Copied from CRM [id number]. Topic: Clinical - Medication Question >> Oct 10, 2023  4:52 PM Gennette ORN wrote: Reason for CRM: Patient daughter calling in because patient can't drink all of the product , she wants to know any other options. The product she is taking making her vomit.

## 2023-10-11 NOTE — Telephone Encounter (Signed)
 Patient's daughter reports patient had some trouble drinking contrast years ago. Pap advise she can have IV only for CT scan.

## 2023-10-12 ENCOUNTER — Ambulatory Visit (HOSPITAL_BASED_OUTPATIENT_CLINIC_OR_DEPARTMENT_OTHER)
Admission: RE | Admit: 2023-10-12 | Discharge: 2023-10-12 | Discharge status: Home / Self Care | Source: Ambulatory Visit | Attending: Family | Admitting: Family

## 2023-10-12 DIAGNOSIS — K5750 Diverticulosis of both small and large intestine without perforation or abscess without bleeding: Secondary | ICD-10-CM | POA: Diagnosis not present

## 2023-10-12 DIAGNOSIS — R109 Unspecified abdominal pain: Secondary | ICD-10-CM | POA: Diagnosis not present

## 2023-10-12 DIAGNOSIS — N200 Calculus of kidney: Secondary | ICD-10-CM | POA: Diagnosis not present

## 2023-10-12 MED ORDER — IOHEXOL 300 MG/ML  SOLN
60.0000 mL | Freq: Once | INTRAMUSCULAR | Status: AC | PRN
  Administered 2023-10-12: 60 mL via INTRAVENOUS

## 2023-10-24 ENCOUNTER — Telehealth: Payer: Self-pay | Admitting: Family

## 2023-10-24 DIAGNOSIS — E1129 Type 2 diabetes mellitus with other diabetic kidney complication: Secondary | ICD-10-CM

## 2023-10-24 MED ORDER — CONTOUR NEXT TEST VI STRP
1.0000 | ORAL_STRIP | Freq: Two times a day (BID) | 12 refills | Status: AC
Start: 2023-10-24 — End: ?

## 2023-10-24 MED ORDER — CONTOUR NEXT GEN MONITOR DEVI
1.0000 | Freq: Every day | 0 refills | Status: AC
Start: 2023-10-24 — End: ?

## 2023-10-24 NOTE — Telephone Encounter (Signed)
 I sent new test strips and a new meter to her pharmacy as her insurance will no longer cover her old meter/strips.

## 2023-10-25 ENCOUNTER — Other Ambulatory Visit: Payer: Self-pay | Admitting: Family

## 2023-11-04 DIAGNOSIS — R21 Rash and other nonspecific skin eruption: Secondary | ICD-10-CM | POA: Diagnosis not present

## 2023-11-07 ENCOUNTER — Ambulatory Visit (INDEPENDENT_AMBULATORY_CARE_PROVIDER_SITE_OTHER): Admitting: Physician Assistant

## 2023-11-07 ENCOUNTER — Encounter: Payer: Self-pay | Admitting: Physician Assistant

## 2023-11-07 VITALS — BP 144/99 | HR 87 | Resp 15 | Ht 61.0 in | Wt 150.8 lb

## 2023-11-07 DIAGNOSIS — R21 Rash and other nonspecific skin eruption: Secondary | ICD-10-CM

## 2023-11-07 MED ORDER — PREDNISONE 10 MG PO TABS
30.0000 mg | ORAL_TABLET | Freq: Every day | ORAL | 0 refills | Status: AC
Start: 2023-11-07 — End: 2023-11-12

## 2023-11-07 NOTE — Progress Notes (Signed)
 Established patient visit   Patient: Katherine Kelly   DOB: 03-Jun-1955   68 y.o. Female  MRN: [id number] Visit Date: 11/07/2023  Today's healthcare provider: Manuelita Flatness, PA-C   Chief Complaint  Patient presents with   Rash    Skin rash on legs and arms started Sunday itches a little  Went to urgent care gave steroid that did not work    Subjective     Pt reports rash that started Sunday, denies any new lotions, creams, denies rash appearing after eating. Denies any new medications.  Since rash was widespread and severe, she went to urgent care, was given a steroid injection. Denies improvement.  Pt reports rash is not itchy or painful. Denies respiratory symptoms.  Medications: Outpatient Medications Prior to Visit  Medication Sig   alendronate  (FOSAMAX ) 70 MG tablet Take 1 tablet (70 mg total) by mouth once a week. Take with a full glass of water on an empty stomach in AM, sit upright for 90 minutes after taking   betamethasone  dipropionate (DIPROLENE ) 0.05 % cream Apply 1 application topically daily as needed.   Blood Glucose Monitoring Suppl (CONTOUR NEXT GEN MONITOR) DEVI 1 Device by Does not apply route daily at 6 (six) AM.   Calcium Carbonate-Vit D-Min (CALTRATE 600+D PLUS MINERALS) 600-800 MG-UNIT CHEW Chew 1 tablet by mouth 2 (two) times daily.   cholecalciferol (VITAMIN D ) 1000 UNITS tablet Take 1,000 Units by mouth daily. Reported on 10/25/2015   Clobetasol  Prop Emollient Base 0.05 % emollient cream Apply 1 application. topically 2 (two) times daily as needed.   fenofibrate  160 MG tablet Take 1 tablet by mouth once daily   glucose blood (CONTOUR NEXT TEST) test strip 1 each by Other route 2 (two) times daily. Use as instructed   Lancets (ONETOUCH ULTRASOFT) lancets Check blood sugars twice daily   losartan -hydrochlorothiazide  (HYZAAR ) 100-25 MG tablet Take 1 tablet by mouth daily.   metFORMIN  (GLUCOPHAGE ) 1000 MG tablet Take 1 tablet (1,000 mg total) by mouth 2  (two) times daily with a meal.   pioglitazone  (ACTOS ) 30 MG tablet Take 1 tablet (30 mg total) by mouth daily.   simvastatin  (ZOCOR ) 10 MG tablet Take 1 tablet (10 mg total) by mouth at bedtime.   sitaGLIPtin  (JANUVIA ) 100 MG tablet Take 1 tablet (100 mg total) by mouth daily.   No facility-administered medications prior to visit.    Review of Systems  Constitutional:  Negative for fatigue and fever.  Respiratory:  Negative for cough and shortness of breath.   Cardiovascular:  Negative for chest pain and leg swelling.  Gastrointestinal:  Negative for abdominal pain.  Skin:  Positive for rash.  Neurological:  Negative for dizziness and headaches.       Objective    BP (!) 144/99   Pulse 87   Resp 15   Ht 5' 1 (1.549 m)   Wt 150 lb 12.8 oz (68.4 kg)   SpO2 98%   BMI 28.49 kg/m    Physical Exam Vitals reviewed.  Constitutional:      Appearance: She is not ill-appearing.  HENT:     Head: Normocephalic.  Eyes:     Conjunctiva/sclera: Conjunctivae normal.  Cardiovascular:     Rate and Rhythm: Normal rate.  Pulmonary:     Effort: Pulmonary effort is normal. No respiratory distress.  Skin:    Comments: Expansive, erythematous patchy rash to bilateral LE, abdomen, upper arms  Neurological:     Mental Status: She  is alert and oriented to person, place, and time.  Psychiatric:        Mood and Affect: Mood normal.        Behavior: Behavior normal.      No results found for any visits on 11/07/23.  Assessment & Plan    Rash -     CBC with Differential/Platelet -     C-reactive protein -     Sedimentation rate -     predniSONE ; Take 3 tablets (30 mg total) by mouth daily with breakfast for 5 days.  Dispense: 15 tablet; Refill: 0  Reviewed case w/ pt's pcp Melissa OSullivan Rx prednisone  x 5 days, check labs. F/u 1 week w/ pcp  Return if symptoms worsen or fail to improve.      Manuelita Flatness, PA-C  [facility] [phone] (phone) [phone] (fax)  [facility]

## 2023-11-08 LAB — CBC WITH DIFFERENTIAL/PLATELET
Basophils Absolute: 0 K/uL (ref 0.0–0.1)
Basophils Relative: 0.6 % (ref 0.0–3.0)
Eosinophils Absolute: 0.5 K/uL (ref 0.0–0.7)
Eosinophils Relative: 7 % — ABNORMAL HIGH (ref 0.0–5.0)
HCT: 42.7 % (ref 36.0–46.0)
Hemoglobin: 14.2 g/dL (ref 12.0–15.0)
Lymphocytes Relative: 17.3 % (ref 12.0–46.0)
Lymphs Abs: 1.1 K/uL (ref 0.7–4.0)
MCHC: 33.3 g/dL (ref 30.0–36.0)
MCV: 97.2 fl (ref 78.0–100.0)
Monocytes Absolute: 0.6 K/uL (ref 0.1–1.0)
Monocytes Relative: 8.9 % (ref 3.0–12.0)
Neutro Abs: 4.3 K/uL (ref 1.4–7.7)
Neutrophils Relative %: 66.2 % (ref 43.0–77.0)
Platelets: 235 K/uL (ref 150.0–400.0)
RBC: 4.39 Mil/uL (ref 3.87–5.11)
RDW: 14 % (ref 11.5–15.5)
WBC: 6.5 K/uL (ref 4.0–10.5)

## 2023-11-08 LAB — C-REACTIVE PROTEIN: CRP: <1 mg/dL (ref 0.5–20.0)

## 2023-11-08 LAB — SEDIMENTATION RATE: Sed Rate: 5 mm/h (ref 0–30)

## 2023-11-08 NOTE — Addendum Note (Signed)
 Addended by: DARYL SETTER on: 11/08/2023 04:40 PM   Modules accepted: Orders

## 2023-11-12 ENCOUNTER — Encounter: Payer: Self-pay | Admitting: Dermatology

## 2023-11-12 ENCOUNTER — Ambulatory Visit: Admitting: Dermatology

## 2023-11-12 VITALS — BP 121/79

## 2023-11-12 DIAGNOSIS — R21 Rash and other nonspecific skin eruption: Secondary | ICD-10-CM | POA: Diagnosis not present

## 2023-11-12 DIAGNOSIS — L308 Other specified dermatitis: Secondary | ICD-10-CM

## 2023-11-12 MED ORDER — TRIAMCINOLONE ACETONIDE 0.1 % EX OINT: TOPICAL_OINTMENT | CUTANEOUS | 10 refills | Status: AC

## 2023-11-12 NOTE — Patient Instructions (Addendum)

## 2023-11-12 NOTE — Progress Notes (Signed)
   New Patient Visit   Subjective  Katherine Kelly is a 68 y.o. female who presents for a NEW PATIENT appointment to be examined for the concerns as listed below.  Rash: Located at the arms, legs & stomach that presented 1 week ago. She has not made any medication or food changes that would warrant this reaction. The areas are not itchy but dry - rating 0 out of 10. She has previously been treated for these areas by urgent care who administered an steroid injection but was ineffective. Patient followed up with PCP who Rx prednisone  - she does not see much change. She also taken OTC Benadryl which was ineffective.    Patient denied Hx of Bx. Patient denied family Hx of skin cancer.   The following portions of the chart were reviewed this encounter and updated as appropriate: medications, allergies, medical history  Review of Systems:  No other skin or systemic complaints except as noted in HPI or Assessment and Plan.  Objective  Well appearing patient in no apparent distress; mood and affect are within normal limits.   A focused examination was performed of the following areas: B/l upper arm, B/L legs & back   Relevant exam findings are noted in the Assessment and Plan.              Assessment & Plan   Rash , Urticarial-like vasculitis - Assessment: Patient presents with non-pruritic rash that started on the legs and spread to other areas of the body approximately one week ago. Characterized by blanchable urticarial plaques on the lower extremities, almost in a levator verticularis-like pattern. Unresponsive to prednisone  treatment from urgent care. Differential diagnoses include leukocytoclastic vasculitis, urticarial vasculitis, viral exanthem, and drug reaction. Recent consumption of clams the day prior to rash onset raises suspicion for a possible new seafood allergy.  - Plan:    Perform punch biopsy to rule out various types of vasculitis    Prescribe triamcinolone  0.1%  cream to be applied twice daily    Apply Aquaphor to biopsy site and cover with bandaid    Order laboratory tests:     - C-ANCA, P-ANCA, ANA     - Seafood allergy panel (specifically for clams)     - CBC, CMP     - Anti-Ro, anti-La  NEOPLASM OF UNCERTAIN BEHAVIOR OF SKIN Left Thigh - Anterior Skin / nail biopsy Type of biopsy: punch   Informed consent: discussed and consent obtained   Timeout: patient name, date of birth, surgical site, and procedure verified   Procedure prep:  Patient was prepped and draped in usual sterile fashion (the patient was cleaned and prepped) Prep type:  Isopropyl alcohol Anesthesia: the lesion was anesthetized in a standard fashion   Anesthetic:  1% lidocaine w/ epinephrine 1-100,000 buffered w/ 8.4% NaHCO3 Punch size:  4 mm Suture size:  4-0 Suture type: Prolene (polypropylene)   Hemostasis achieved with: suture, pressure and aluminum chloride   Outcome: patient tolerated procedure well   Post-procedure details: sterile dressing applied and wound care instructions given   Dressing type: bandage, petrolatum and pressure dressing     Return in about 2 weeks (around 11/26/2023) for suture removal & results.   Documentation: I have reviewed the above documentation for accuracy and completeness, and I agree with the above.  I, Shirron Maranda, CMA, am acting as scribe for [employer], DO.   Delon Lenis, DO

## 2023-11-13 ENCOUNTER — Ambulatory Visit: Payer: Self-pay | Admitting: Dermatology

## 2023-11-13 NOTE — Addendum Note (Signed)
 Addended by: Bee Marchiano J on: 11/13/2023 05:00 PM   Modules accepted: Orders

## 2023-11-14 DIAGNOSIS — R21 Rash and other nonspecific skin eruption: Secondary | ICD-10-CM | POA: Diagnosis not present

## 2023-11-14 LAB — SURGICAL PATHOLOGY

## 2023-11-15 ENCOUNTER — Ambulatory Visit: Admitting: Family

## 2023-11-15 VITALS — BP 136/79 | HR 77 | Temp 98.6°F | Resp 16 | Ht 61.0 in | Wt 149.8 lb

## 2023-11-15 DIAGNOSIS — E1129 Type 2 diabetes mellitus with other diabetic kidney complication: Secondary | ICD-10-CM | POA: Diagnosis not present

## 2023-11-15 DIAGNOSIS — R809 Proteinuria, unspecified: Secondary | ICD-10-CM | POA: Diagnosis not present

## 2023-11-15 DIAGNOSIS — M810 Age-related osteoporosis without current pathological fracture: Secondary | ICD-10-CM | POA: Diagnosis not present

## 2023-11-15 DIAGNOSIS — Z7984 Long term (current) use of oral hypoglycemic drugs: Secondary | ICD-10-CM

## 2023-11-15 DIAGNOSIS — I10 Essential (primary) hypertension: Secondary | ICD-10-CM | POA: Diagnosis not present

## 2023-11-15 DIAGNOSIS — I350 Nonrheumatic aortic (valve) stenosis: Secondary | ICD-10-CM

## 2023-11-15 DIAGNOSIS — R21 Rash and other nonspecific skin eruption: Secondary | ICD-10-CM | POA: Diagnosis not present

## 2023-11-15 DIAGNOSIS — E781 Pure hyperglyceridemia: Secondary | ICD-10-CM | POA: Diagnosis not present

## 2023-11-15 DIAGNOSIS — E785 Hyperlipidemia, unspecified: Secondary | ICD-10-CM | POA: Diagnosis not present

## 2023-11-15 LAB — HEMOGLOBIN A1C
Hgb A1c MFr Bld: 8.4 % — ABNORMAL HIGH (ref <5.7)
Mean Plasma Glucose: 194 mg/dL
eAG (mmol/L): 10.8 mmol/L

## 2023-11-15 MED ORDER — FENOFIBRATE 160 MG PO TABS: 160.0000 mg | ORAL_TABLET | Freq: Every day | ORAL | 0 refills | Status: DC

## 2023-11-15 MED ORDER — PIOGLITAZONE HCL 30 MG PO TABS: 30.0000 mg | ORAL_TABLET | Freq: Every day | ORAL | 1 refills | Status: AC

## 2023-11-15 MED ORDER — METFORMIN HCL 1000 MG PO TABS
1000.0000 mg | ORAL_TABLET | Freq: Two times a day (BID) | ORAL | 1 refills | Status: AC
Start: 2023-11-15 — End: ?

## 2023-11-15 NOTE — Assessment & Plan Note (Signed)
 Last A1C above goal.  Reports good compliance with medication.

## 2023-11-15 NOTE — Progress Notes (Signed)
 Subjective:     Patient ID: Katherine Kelly, female    DOB: October 09, 1955, 68 y.o.   MRN: [id number]  Chief Complaint  Patient presents with   Rash    Here for follow up   Hypertension    Follow up    Rash  Hypertension    Discussed the use of AI scribe software for clinical note transcription with the patient, who gave verbal consent to proceed.  History of Present Illness   Katherine Kelly is a 68 year old female with diabetes who presents for follow-up after a dermatology consultation for a rash.  The rash is less red than before but remains present and is not pruritic. A biopsy was performed two days ago, and blood work results are pending.  She is on Januvia , metformin , and Actos  for diabetes management. Her A1c improved to 7.4 three months ago from 7.8.  She experiences mild shortness of breath during daily activities and is under cardiology care for aortic stenosis.    Health Maintenance Due  Topic Date Due   Diabetic kidney evaluation - Urine ACR  12/14/2015   DTaP/Tdap/Td (2 - Td or Tdap) 12/21/2020   Zoster Vaccines- Shingrix (2 of 2) 12/27/2020   OPHTHALMOLOGY EXAM  03/28/2023   Medicare Annual Wellness (AWV)  06/14/2023   INFLUENZA VACCINE  11/15/2023    Past Medical History:  Diagnosis Date   ACE-inhibitor cough    ALLERGIC RHINITIS 01/03/2009   Qualifier: Diagnosis of  By: Georgian ROSALEA CHARM Lamar    ALLERGIC RHINITIS 01/03/2009   Qualifier: Diagnosis of   By: Georgian ROSALEA CHARM Lamar     Replacing diagnoses that were inactivated after the 07/16/22 regulatory import     Annual physical exam 12/22/2010   Aortic valve stenosis 08/30/2020   Arthritis    hands   ASCUS of cervix with negative high risk HPV 03/20/2017   11/2016  Per guidelines, repeat cytology and HPV testing 3 years.     Cataract, bilateral    COVID-19 vaccine series declined 11/01/2020   Depression 08/09/2014   Diabetes mellitus, type 2 (HCC) 09/2006   on meds   Diabetes type 2, controlled (HCC)  11/27/2006   Qualifier: Diagnosis of  By: Kassie MD, Sean A    Diabetic neuropathy (HCC) 08/31/2013   on meds   Dyslipidemia 10/07/2020   Eczema 09/14/2014   Essential hypertension 11/27/2006   Qualifier: Diagnosis of  By: Kassie MD, Alyce A    History of colonic polyps    History of motion sickness 02/02/2022   Hypertension    on meds   HYPERTRIGLYCERIDEMIA 02/18/2007   Qualifier: Diagnosis of   By: Georgian ROSALEA CHARM Lamar        Osteoporosis    RHINOSINUSITIS, CHRONIC 07/07/2009   Qualifier: Diagnosis of  By: Georgian ROSALEA CHARM Lamar     Past Surgical History:  Procedure Laterality Date   BELPHAROPTOSIS REPAIR Bilateral    BREAST BIOPSY Left    stereo tatic   CARDIOVASCULAR STRESS TEST  11/08/2006   CESAREAN SECTION  1980 & 1982   COLONOSCOPY  2014   JP-MAC-mov(good)-TA   ESOPHAGOGASTRODUODENOSCOPY  04/2006   POLYPECTOMY  2014   TA    Family History  Problem Relation Age of Onset   Colon polyps Sister 81   Colon polyps Brother 28   Colon cancer Neg Hx    Rectal cancer Neg Hx    Stomach cancer Neg Hx    Esophageal cancer Neg Hx  Social History   Socioeconomic History   Marital status: Married    Spouse name: Not on file   Number of children: Not on file   Years of education: Not on file   Highest education level: Not on file  Occupational History   Not on file  Tobacco Use   Smoking status: Never   Smokeless tobacco: Never  Vaping Use   Vaping status: Never Used  Substance and Sexual Activity   Alcohol use: No   Drug use: No   Sexual activity: Not on file  Other Topics Concern   Not on file  Social History Narrative   Lives with husband and daughter   Production designer, 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[employer]   No pets   Did not complete high school   Enjoys travel and shopping   Social Drivers of Health   Financial Resource Strain: Low Risk  (06/13/2022)   Overall Financial Resource Strain (CARDIA)    Difficulty of Paying Living Expenses: Not hard at all  Food Insecurity: No Food  Insecurity (06/13/2022)   Hunger Vital Sign    Worried About Running Out of Food in the Last Year: Never true    Ran Out of Food in the Last Year: Never true  Transportation Needs: No Transportation Needs (06/13/2022)   PRAPARE - Administrator, Civil Service (Medical): No    Lack of Transportation (Non-Medical): No  Physical Activity: Not on file  Stress: No Stress Concern Present (06/13/2022)   [employer] of Occupational Health - Occupational Stress Questionnaire    Feeling of Stress : Not at all  Social Connections: Moderately Integrated (06/13/2022)   Social Connection and Isolation Panel    Frequency of Communication with Friends and Family: More than three times a week    Frequency of Social Gatherings with Friends and Family: Once a week    Attends Religious Services: More than 4 times per year    Active Member of [employer] or Organizations: No    Attends Banker Meetings: Never    Marital Status: Married  Catering manager Violence: Not At Risk (06/13/2022)   Humiliation, Afraid, Rape, and Kick questionnaire    Fear of Current or Ex-Partner: No    Emotionally Abused: No    Physically Abused: No    Sexually Abused: No    Outpatient Medications Prior to Visit  Medication Sig Dispense Refill   alendronate  (FOSAMAX ) 70 MG tablet Take 1 tablet (70 mg total) by mouth once a week. Take with a full glass of water on an empty stomach in AM, sit upright for 90 minutes after taking 12 tablet 4   betamethasone  dipropionate (DIPROLENE ) 0.05 % cream Apply 1 application topically daily as needed. 30 g 1   Blood Glucose Monitoring Suppl (CONTOUR NEXT GEN MONITOR) DEVI 1 Device by Does not apply route daily at 6 (six) AM. 1 each 0   Calcium Carbonate-Vit D-Min (CALTRATE 600+D PLUS MINERALS) 600-800 MG-UNIT CHEW Chew 1 tablet by mouth 2 (two) times daily. 60 tablet    cholecalciferol (VITAMIN D ) 1000 UNITS tablet Take 1,000 Units by mouth daily. Reported on 10/25/2015      Clobetasol  Prop Emollient Base 0.05 % emollient cream Apply 1 application. topically 2 (two) times daily as needed. 30 g 1   glucose blood (CONTOUR NEXT TEST) test strip 1 each by Other route 2 (two) times daily. Use as instructed 100 each 12   Lancets (ONETOUCH ULTRASOFT) lancets Check blood sugars twice daily 100 each  12   losartan -hydrochlorothiazide  (HYZAAR ) 100-25 MG tablet Take 1 tablet by mouth daily. 90 tablet 1   simvastatin  (ZOCOR ) 10 MG tablet Take 1 tablet (10 mg total) by mouth at bedtime. 90 tablet 1   sitaGLIPtin  (JANUVIA ) 100 MG tablet Take 1 tablet (100 mg total) by mouth daily. 30 tablet 5   triamcinolone  ointment (KENALOG ) 0.1 % apply to affected areas BID for 2 weeks, then take a 2 weeks break. Repeat PRN. 453 g 10   fenofibrate  160 MG tablet Take 1 tablet by mouth once daily 90 tablet 0   metFORMIN  (GLUCOPHAGE ) 1000 MG tablet Take 1 tablet (1,000 mg total) by mouth 2 (two) times daily with a meal. 180 tablet 1   pioglitazone  (ACTOS ) 30 MG tablet Take 1 tablet (30 mg total) by mouth daily. 90 tablet 1   No facility-administered medications prior to visit.    Allergies  Allergen Reactions   Aspirin Other (See Comments)    unknown   Ace Inhibitors     weakness    Review of Systems  Skin:  Positive for rash.       Objective:    Physical Exam Constitutional:      General: She is not in acute distress.    Appearance: Normal appearance. She is well-developed.  HENT:     Head: Normocephalic and atraumatic.     Right Ear: External ear normal.     Left Ear: External ear normal.  Eyes:     General: No scleral icterus. Neck:     Thyroid : No thyromegaly.  Cardiovascular:     Rate and Rhythm: Normal rate and regular rhythm.     Heart sounds: Murmur heard.  Pulmonary:     Effort: Pulmonary effort is normal. No respiratory distress.     Breath sounds: Normal breath sounds. No wheezing.  Musculoskeletal:     Cervical back: Neck supple.  Skin:    General: Skin is  warm and dry.  Neurological:     Mental Status: She is alert and oriented to person, place, and time.  Psychiatric:        Mood and Affect: Mood normal.        Behavior: Behavior normal.        Thought Content: Thought content normal.        Judgment: Judgment normal.    Diabetic Foot Exam - Simple   Simple Foot Form Diabetic Foot exam was performed with the following findings: Yes 11/15/2023  2:54 PM  Visual Inspection No deformities, no ulcerations, no other skin breakdown bilaterally: Yes Sensation Testing Intact to touch and monofilament testing bilaterally: Yes Pulse Check Posterior Tibialis and Dorsalis pulse intact bilaterally: Yes Comments    Diabetic Foot Exam - Simple   Simple Foot Form Diabetic Foot exam was performed with the following findings: Yes 11/15/2023  2:54 PM  Visual Inspection No deformities, no ulcerations, no other skin breakdown bilaterally: Yes Sensation Testing Intact to touch and monofilament testing bilaterally: Yes Pulse Check Posterior Tibialis and Dorsalis pulse intact bilaterally: Yes Comments        BP 136/79 (BP Location: Right Arm, Patient Position: Sitting, Cuff Size: Normal)   Pulse 77   Temp 98.6 F (37 C) (Oral)   Resp 16   Ht 5' 1 (1.549 m)   Wt 149 lb 12.8 oz (67.9 kg)   SpO2 97%   BMI 28.30 kg/m  Wt Readings from Last 3 Encounters:  11/15/23 149 lb 12.8 oz (67.9 kg)  11/07/23  150 lb 12.8 oz (68.4 kg)  10/08/23 150 lb (68 kg)       Assessment & Plan:   Problem List Items Addressed This Visit       Unprioritized   Skin rash   Improving.  Saw dermatology- labs and biopsy results pending.       Osteoporosis   Continues fosamax . Plan to update dexa this winter.      HYPERTRIGLYCERIDEMIA   Stable on fenofibrate /statin. Continue same.       Relevant Medications   fenofibrate  160 MG tablet   Essential hypertension   BP stable on hyzaar .  Continue same.       Relevant Medications   fenofibrate  160 MG  tablet   Dyslipidemia - Primary   Lab Results  Component Value Date   CHOL 141 05/13/2023   HDL 54.90 05/13/2023   LDLCALC 64 05/13/2023   LDLDIRECT 64.9 12/17/2006   TRIG 110.0 05/13/2023   CHOLHDL 3 05/13/2023   At goal on simvastatin .        Relevant Medications   fenofibrate  160 MG tablet   Diabetes type 2, controlled (HCC)   Last A1C above goal.  Reports good compliance with medication.       Relevant Medications   metFORMIN  (GLUCOPHAGE ) 1000 MG tablet   pioglitazone  (ACTOS ) 30 MG tablet   Other Relevant Orders   HgB A1c   Urine Microalbumin w/creat. ratio   Aortic valve stenosis   Stable- following with cardiology for 2d echo in 18 months.      Relevant Medications   fenofibrate  160 MG tablet    I have changed Katherine Kelly's fenofibrate . I am also having her maintain her cholecalciferol, betamethasone  dipropionate, Clobetasol  Prop Emollient Base, Caltrate 600+D Plus Minerals, losartan -hydrochlorothiazide , simvastatin , alendronate , sitaGLIPtin , onetouch ultrasoft, Contour Next Gen Monitor, Contour Next Test, triamcinolone  ointment, metFORMIN , and pioglitazone .  Meds ordered this encounter  Medications   metFORMIN  (GLUCOPHAGE ) 1000 MG tablet    Sig: Take 1 tablet (1,000 mg total) by mouth 2 (two) times daily with a meal.    Dispense:  180 tablet    Refill:  1    Supervising Provider:   DOMENICA BLACKBIRD A [[id number]]   pioglitazone  (ACTOS ) 30 MG tablet    Sig: Take 1 tablet (30 mg total) by mouth daily.    Dispense:  90 tablet    Refill:  1    Supervising Provider:   DOMENICA BLACKBIRD A [[id number]]   fenofibrate  160 MG tablet    Sig: Take 1 tablet (160 mg total) by mouth daily.    Dispense:  90 tablet    Refill:  0    Supervising Provider:   DOMENICA BLACKBIRD A [[id number]]

## 2023-11-15 NOTE — Assessment & Plan Note (Signed)
BP stable on hyzaar.  Continue same.  

## 2023-11-15 NOTE — Assessment & Plan Note (Signed)
 Improving.  Saw dermatology- labs and biopsy results pending.

## 2023-11-15 NOTE — Assessment & Plan Note (Signed)
 Continues fosamax . Plan to update dexa this winter.

## 2023-11-15 NOTE — Assessment & Plan Note (Signed)
 Stable- following with cardiology for 2d echo in 18 months.

## 2023-11-15 NOTE — Assessment & Plan Note (Signed)
 Stable on fenofibrate /statin. Continue same.

## 2023-11-15 NOTE — Patient Instructions (Addendum)
 VISIT SUMMARY:  You had a follow-up visit to discuss your chronic skin rash, diabetes management, aortic stenosis, and osteoporosis monitoring.  YOUR PLAN:  CHRONIC SKIN RASH: Your rash is less red and not itchy. We are waiting for the biopsy and blood work results from your dermatologist. -Continue following up with dermatology for lab results.  TYPE 2 DIABETES MELLITUS: Your A1c has improved from 7.8 to 7.4. You are currently taking Januvia , metformin , and Actos . -Update your A1c today. -Refill metformin . -Refill Actos . -Check for recent eye exam report and send it to your provider.  AORTIC STENOSIS: You are experiencing slight shortness of breath and are under cardiology care. -Repeat echocardiogram in 18 months.  OSTEOPOROSIS: We are monitoring your condition. -Order a bone density scan later in the fall.

## 2023-11-15 NOTE — Assessment & Plan Note (Signed)
 Lab Results  Component Value Date   CHOL 141 05/13/2023   HDL 54.90 05/13/2023   LDLCALC 64 05/13/2023   LDLDIRECT 64.9 12/17/2006   TRIG 110.0 05/13/2023   CHOLHDL 3 05/13/2023   At goal on simvastatin .

## 2023-11-16 LAB — MICROALBUMIN / CREATININE URINE RATIO
Creatinine, Urine: 47 mg/dL (ref 20–275)
Microalb Creat Ratio: 6 mg/g{creat} (ref <30)
Microalb, Ur: 0.3 mg/dL

## 2023-11-18 ENCOUNTER — Ambulatory Visit: Payer: Self-pay | Admitting: Family

## 2023-11-18 DIAGNOSIS — E1129 Type 2 diabetes mellitus with other diabetic kidney complication: Secondary | ICD-10-CM

## 2023-11-18 LAB — CBC WITH DIFFERENTIAL/PLATELET
Basophils Absolute: 0.1 x10E3/uL (ref 0.0–0.2)
Basos: 1 %
EOS (ABSOLUTE): 0.2 x10E3/uL (ref 0.0–0.4)
Eos: 2 %
Hematocrit: 43.8 % (ref 34.0–46.6)
Hemoglobin: 14.2 g/dL (ref 11.1–15.9)
Immature Grans (Abs): 0.2 x10E3/uL — ABNORMAL HIGH (ref 0.0–0.1)
Immature Granulocytes: 2 %
Lymphocytes Absolute: 2.3 x10E3/uL (ref 0.7–3.1)
Lymphs: 22 %
MCH: 32.6 pg (ref 26.6–33.0)
MCHC: 32.4 g/dL (ref 31.5–35.7)
MCV: 101 fL — ABNORMAL HIGH (ref 79–97)
Monocytes Absolute: 0.8 x10E3/uL (ref 0.1–0.9)
Monocytes: 8 %
Neutrophils Absolute: 7 x10E3/uL (ref 1.4–7.0)
Neutrophils: 65 %
Platelets: 282 x10E3/uL (ref 150–450)
RBC: 4.36 x10E6/uL (ref 3.77–5.28)
RDW: 12.2 % (ref 11.7–15.4)
WBC: 10.5 x10E3/uL (ref 3.4–10.8)

## 2023-11-18 LAB — COMPREHENSIVE METABOLIC PANEL WITH GFR
ALT: 19 IU/L (ref 0–32)
AST: 18 IU/L (ref 0–40)
Albumin: 4.5 g/dL (ref 3.9–4.9)
Alkaline Phosphatase: 58 IU/L (ref 44–121)
BUN/Creatinine Ratio: 24 (ref 12–28)
BUN: 33 mg/dL — ABNORMAL HIGH (ref 8–27)
Bilirubin Total: 0.4 mg/dL (ref 0.0–1.2)
CO2: 22 mmol/L (ref 20–29)
Calcium: 9.9 mg/dL (ref 8.7–10.3)
Chloride: 94 mmol/L — ABNORMAL LOW (ref 96–106)
Creatinine, Ser: 1.35 mg/dL — ABNORMAL HIGH (ref 0.57–1.00)
Globulin, Total: 2.7 g/dL (ref 1.5–4.5)
Glucose: 428 mg/dL — ABNORMAL HIGH (ref 70–99)
Potassium: 4.4 mmol/L (ref 3.5–5.2)
Sodium: 135 mmol/L (ref 134–144)
Total Protein: 7.2 g/dL (ref 6.0–8.5)
eGFR: 43 mL/min/1.73 — ABNORMAL LOW (ref >59)

## 2023-11-18 LAB — ANA,IFA RA DIAG PNL W/RFLX TIT/PATN
ANA Titer 1: NEGATIVE
Cyclic Citrullin Peptide Ab: 5 U (ref 0–19)
Rheumatoid fact SerPl-aCnc: <10 [IU]/mL (ref <14.0)

## 2023-11-18 LAB — ANCA TITERS
Atypical pANCA: <1:20 {titer}
C-ANCA: <1:20 {titer}
P-ANCA: <1:20 {titer}

## 2023-11-18 LAB — SJOGREN'S SYNDROME ANTIBODS(SSA + SSB)
ENA SSA (RO) Ab: <0.2 AI (ref 0.0–0.9)
ENA SSB (LA) Ab: <0.2 AI (ref 0.0–0.9)

## 2023-11-18 NOTE — Telephone Encounter (Signed)
 Please advise her that her sugar is uncontrolled.  I would like her to add lantus 5 units sq once daily (Rx pended). We can schedule a nurse visit for teaching.  Continue to work on diabetic diet and exercise.

## 2023-11-20 NOTE — Telephone Encounter (Signed)
 Unfortunately, since she is already on 2 pills, the remaining medications that are in pill form that are safe for her are more expensive.  She should work hard on diet, increase the metformin  to 1000mg  bid. I will refer her to pharmacist to see if she can help with medication patient assistance.

## 2023-11-28 ENCOUNTER — Ambulatory Visit: Admitting: Dermatology

## 2023-11-28 DIAGNOSIS — L509 Urticaria, unspecified: Secondary | ICD-10-CM

## 2023-11-28 NOTE — Patient Instructions (Signed)

## 2023-11-28 NOTE — Progress Notes (Signed)
 Follow-Up Visit   Subjective  Katherine Kelly is a 68 y.o. female, accompanied by daughter, established patient who presents for FOLLOW UP on the diagnoses listed below:  Patient was last evaluated on 11/12/23 for a rash. 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 She was Rx TMC to apply BID for 2 weeks on 2 weeks off - patient stated that she sees improvement   The following portions of the chart were reviewed this encounter and updated as appropriate: medications, allergies, medical history  Review of Systems:  No other skin or systemic complaints except as noted in HPI or Assessment and Plan.  Objective  Well appearing patient in no apparent distress; mood and affect are within normal limits.   A focused examination was performed of the following areas: scattered   Relevant exam findings are noted in the Assessment and Plan. REPORT OF DERMATOPATHOLOGY FINAL DIAGNOSIS and MICROSCOPIC DESCRIPTION Diagnosis Skin , left thigh - anterior PERIVASCULAR DERMATITIS WITH EOSINOPHILS, SEE DESCRIPTION Microscopic Description There is a moderately dense inflammatory infiltrate in the dermis primarily in a perivascular distribution. The infiltrate contains lymphocytes, eosinophils and occasional histiocytes. The differential diagnosis from a histological standpoint includes florid urticaria, papular urticaria, insect bite reactions or other arthropod bites. Scabies is included in the differential but diagnostic organisms are not seen. Drug reactions may also show changes such as this. Vasculitic changes are not observed. A special stain (PAS-F) is performed to determine the presence or absence of fungal hyphae in this biopsy. The PAS stain is negative for fungal hyphae, and the controls stained appropriately. Component     Latest Ref Rng 11/14/2023  WBC     3.4 - 10.8 x10E3/uL 10.5   RBC     3.77 - 5.28 x10E6/uL 4.36   Hemoglobin     11.1 - 15.9 g/dL 85.7   HCT      65.9 - 46.6 % 43.8   MCV     79 - 97 fL 101 (H)   MCH     26.6 - 33.0 pg 32.6   MCHC     31.5 - 35.7 g/dL 67.5   RDW     88.2 - 84.5 % 12.2   Platelets     150 - 450 x10E3/uL 282   Neutrophils     Not Estab. % 65   Lymphs     Not Estab. % 22   Monocytes     Not Estab. % 8   Eos     Not Estab. % 2   Basos     Not Estab. % 1   NEUT#     1.4 - 7.0 x10E3/uL 7.0   Lymphs Abs     0.7 - 3.1 x10E3/uL 2.3   Monocytes Absolute     0.1 - 0.9 x10E3/uL 0.8   EOS (ABSOLUTE)     0.0 - 0.4 x10E3/uL 0.2   Basophils Absolute     0.0 - 0.2 x10E3/uL 0.1   Immature Granulocytes     Not Estab. % 2   Immature Grans (Abs)     0.0 - 0.1 x10E3/uL 0.2 (H)   Glucose     70 - 99 mg/dL 571 (H)   BUN     8 - 27 mg/dL 33 (H)   Creatinine     0.57 - 1.00 mg/dL 8.64 (H)   eGFR     >40 mL/min/1.73 43 (L)   BUN/Creatinine Ratio     12 - 28  24  Sodium     134 - 144 mmol/L 135   Potassium     3.5 - 5.2 mmol/L 4.4   Chloride     96 - 106 mmol/L 94 (L)   CO2     20 - 29 mmol/L 22   Calcium     8.7 - 10.3 mg/dL 9.9   Total Protein     6.0 - 8.5 g/dL 7.2   Albumin     3.9 - 4.9 g/dL 4.5   Globulin, Total     1.5 - 4.5 g/dL 2.7   Total Bilirubin     0.0 - 1.2 mg/dL 0.4   Alkaline Phosphatase     44 - 121 IU/L 58   AST     0 - 40 IU/L 18   ALT     0 - 32 IU/L 19   Cytoplasmic (C-ANCA)     Neg:<1:20 titer <1:20   P-ANCA     Neg:<1:20 titer <1:20   Atypical pANCA     Neg:<1:20 titer <1:20   ANA Titer 1 Negative   RA Latex Turbid.     <14.0 IU/mL <10.0   Cyclic Citrullin Peptide Ab     0 - 19 units 5   ENA SSA (RO) Ab     0.0 - 0.9 AI <0.2   ENA SSB (LA) Ab     0.0 - 0.9 AI <0.2     Legend: (H) High (L) Low                  Assessment & Plan   1. Urticarial Rash (Improving) - Assessment: Patient's rash has shown improvement but remains present. Lab work revealed inflammation with eosinophils indicating allergic reaction. Differential diagnosis includes urticaria,  bug bite reactions, or viral exanthem. Autoimmune causes and vasculitis ruled out with negative ANCA and ANA tests. Recent clam consumption noted though patient has tolerated clams previously, unfortunately MA forgot to include seafood profile on labslip.  Pt has apt with Allergist tomorrow.   - Plan:    Continue triamcinolone  cream: 2 weeks on, 2 weeks off cycle until rash resolves    Recommend CeraVe lotion for moisturizing during off-weeks of triamcinolone     Suggest over-the-counter DermMend cream as daily moisturizer to help dissipate rash faster    Follow up with allergist appointment already scheduled    Monitor for any changes in the rash    Patient to message via MyChart for any concerns rather than calling  Follow-up as needed if condition changes.   Return if symptoms worsen or fail to improve.   Documentation: I have reviewed the above documentation for accuracy and completeness, and I agree with the above.  I, Shirron Maranda, CMA, am acting as scribe for [employer], DO.   Delon Lenis, DO

## 2023-11-29 ENCOUNTER — Other Ambulatory Visit: Payer: Self-pay

## 2023-11-29 ENCOUNTER — Encounter: Payer: Self-pay | Admitting: Internal Medicine

## 2023-11-29 ENCOUNTER — Ambulatory Visit: Admitting: Internal Medicine

## 2023-11-29 VITALS — BP 122/90 | HR 77 | Temp 97.9°F | Resp 16 | Ht 61.0 in | Wt 146.0 lb

## 2023-11-29 DIAGNOSIS — L500 Allergic urticaria: Secondary | ICD-10-CM

## 2023-11-29 NOTE — Patient Instructions (Addendum)
 Urticaria (Hives): - At this time etiology of hives and swelling is unknown. Hives can be caused by a variety of different triggers including illness/infection, insect bites, stress, pressure, vibrations, extremes of temperature to name a few however majority of the time there is no identifiable trigger.  -Continue triamcinolone  0.1% twice daily for 1-2 weeks. Followed by Dermatology  Hold all anti-histamines (Xyzal, Allegra, Zyrtec , Claritin, Benadryl, Pepcid ) 3 days prior to next visit.   Follow up: 8/22 at [facility] for skin testing 1-55, shellfish mix and individuals    M? ?ay (M? ?ay): - Hi?n t?i, nguyn nhn gy n?i m? ?ay v s?ng t?y v?n ch?a ???c bi?t r. M? ?ay c th? do nhi?u tc nhn khc nhau gy ra, bao g?m b?nh t?t/nhi?m trng, cn trng c?n, c?ng th?ng, p l?c, rung ??ng, nhi?t ?? qu cao, v.v., tuy nhin, ph?n l?n tr??ng h?p khng xc ??nh ???c tc nhn gy b?nh. - Ti?p t?c dng triamcinolone  0,1% hai l?n m?i ngy trong 1-2 tu?n. Sau ?, khm da li?u.  Ng?ng t?t c? cc thu?c khng histamine (Xyzal, Allegra, Zyrtec , Claritin, Benadryl, Pepcid ) 3 ngy tr??c l?n khm ti?p theo.  Ti khm: 2 gi? chi?u ngy 22/8 ?? xt nghi?m da (1-55, h?n h?p ??ng v?t c v? v c nhn)

## 2023-11-29 NOTE — Progress Notes (Addendum)
 NEW PATIENT  Date of Service/Encounter:  11/29/23  Consult requested by: Daryl Setter, NP   Subjective:   Katherine Kelly (DOB: [birth year]) is a 68 y.o. female who presents to the clinic on 11/29/2023 with a chief complaint of Establish Care (She presents with hives, redness x 2 weeks after eating clams ) .    History obtained from: chart review and patient and husband.   Interpretor services offered but patient and husband refused.    Chronic Urticaria/Angioedema: About 3 weeks ago, ate clams and next day, started having red spots on legs that then darkened later.  Now slowly getting better with topical steroid given by Dermatology.  After the onset, went to urgent care and given steroid shot without improvement.  Went to PCP and did labwork that was normal and given oral prednisone  but did not help.  Spread to upper and legs.  Not very itchy or painful.  Denies any joint pain.  No hx of allergic rhinitis/asthma/eczema. No new medications.   No illness.  No insect bites. Benadryl/Zyrtec  did not help much  Few years ago, was in [place]  casino and developed redness of arms and abdomen but that time she took benadryl and OTC cream and it helped.   Still avoiding all shellfish.    Reviewed:  11/28/2023: seen by Dr Alm Dermatology for urticarial rash- use triamcinolone .  ANCA and ANA negative.  Ddx urticaria, bug bite, viral xanthem; low suspicion for vasculitis/autoimmune.  Clam testing was not done.   11/14/2023: negative ANA, ANCA   11/07/2023: normal CRP and ESR; AEC 500   11/07/2023: seen by Drubel PA for itchy rash on arms and legs, no improvement with steroid injection x1 given by urgent care.  No pain.  Given 5 day course of prednisone .   09/03/2023: seen by Dr Madireddy Cardiology for aortic stenosis, stable and HTN on hydrochlorothiazide  and losartan .   Past Medical History: Past Medical History:  Diagnosis Date   ACE-inhibitor cough    ALLERGIC RHINITIS 01/03/2009    Qualifier: Diagnosis of  By: Georgian ROSALEA CHARM Lamar    ALLERGIC RHINITIS 01/03/2009   Qualifier: Diagnosis of   By: Georgian ROSALEA CHARM Lamar     Replacing diagnoses that were inactivated after the 07/16/22 regulatory import     Annual physical exam 12/22/2010   Aortic valve stenosis 08/30/2020   Arthritis    hands   ASCUS of cervix with negative high risk HPV 03/20/2017   11/2016  Per guidelines, repeat cytology and HPV testing 3 years.     Cataract, bilateral    COVID-19 vaccine series declined 11/01/2020   Depression 08/09/2014   Diabetes mellitus, type 2 (HCC) 09/2006   on meds   Diabetes type 2, controlled (HCC) 11/27/2006   Qualifier: Diagnosis of  By: Kassie MD, Sean A    Diabetic neuropathy (HCC) 08/31/2013   on meds   Dyslipidemia 10/07/2020   Eczema 09/14/2014   Essential hypertension 11/27/2006   Qualifier: Diagnosis of  By: Kassie MD, Alyce A    History of colonic polyps    History of motion sickness 02/02/2022   Hypertension    on meds   HYPERTRIGLYCERIDEMIA 02/18/2007   Qualifier: Diagnosis of   By: Georgian ROSALEA CHARM Lamar        Osteoporosis    RHINOSINUSITIS, CHRONIC 07/07/2009   Qualifier: Diagnosis of  By: Georgian ROSALEA CHARM Lamar    Past Surgical History: Past Surgical History:  Procedure Laterality Date   BELPHAROPTOSIS REPAIR Bilateral  BREAST BIOPSY Left    stereo tatic   CARDIOVASCULAR STRESS TEST  11/08/2006   CESAREAN SECTION  1980 & 1982   COLONOSCOPY  2014   JP-MAC-mov(good)-TA   ESOPHAGOGASTRODUODENOSCOPY  04/2006   POLYPECTOMY  2014   TA    Family History: Family History  Problem Relation Age of Onset   Colon polyps Sister 9   Colon polyps Brother 62   Colon cancer Neg Hx    Rectal cancer Neg Hx    Stomach cancer Neg Hx    Esophageal cancer Neg Hx     Medication List:  Allergies as of 11/29/2023       Reactions   Aspirin Other (See Comments)   unknown   Ace Inhibitors    weakness        Medication List        Accurate as of November 29, 2023  2:36 PM. If you have any questions, ask your nurse or doctor.          alendronate  70 MG tablet Commonly known as: FOSAMAX  Take 1 tablet (70 mg total) by mouth once a week. Take with a full glass of water on an empty stomach in AM, sit upright for 90 minutes after taking   betamethasone  dipropionate 0.05 % cream Apply 1 application topically daily as needed.   Caltrate 600+D Plus Minerals 600-800 MG-UNIT Chew Chew 1 tablet by mouth 2 (two) times daily.   cholecalciferol 1000 units tablet Commonly known as: VITAMIN D  Take 1,000 Units by mouth daily. Reported on 10/25/2015   Clobetasol  Prop Emollient Base 0.05 % emollient cream Apply 1 application. topically 2 (two) times daily as needed.   Contour Next Gen Monitor Devi 1 Device by Does not apply route daily at 6 (six) AM.   Contour Next Test test strip Generic drug: glucose blood 1 each by Other route 2 (two) times daily. Use as instructed   fenofibrate  160 MG tablet Take 1 tablet (160 mg total) by mouth daily.   losartan -hydrochlorothiazide  100-25 MG tablet Commonly known as: HYZAAR  U?ng 1 vin m?i ngy. (Take 1 tablet by mouth daily.)   metFORMIN  1000 MG tablet Commonly known as: GLUCOPHAGE  Take 1 tablet (1,000 mg total) by mouth 2 (two) times daily with a meal.   onetouch ultrasoft lancets Check blood sugars twice daily   pioglitazone  30 MG tablet Commonly known as: Actos  Take 1 tablet (30 mg total) by mouth daily.   simvastatin  10 MG tablet Commonly known as: ZOCOR  Take 1 tablet (10 mg total) by mouth at bedtime.   triamcinolone  ointment 0.1 % Commonly known as: KENALOG  apply to affected areas BID for 2 weeks, then take a 2 weeks break. Repeat PRN.         REVIEW OF SYSTEMS: Pertinent positives and negatives discussed in HPI.   Objective:   Physical Exam: BP (!) 122/90 (BP Location: Right Arm, Patient Position: Sitting, Cuff Size: Normal)   Pulse 77   Temp 97.9 F (36.6 C) (Temporal)    Resp 16   Ht 5' 1 (1.549 m)   Wt 146 lb (66.2 kg)   SpO2 96%   BMI 27.59 kg/m  Body mass index is 27.59 kg/m. GEN: alert, well developed HEENT: clear conjunctiva, nose without rhinorrhea  HEART: regular rate and rhythm, no murmur LUNGS: clear to auscultation bilaterally, no coughing, unlabored respiration ABDOMEN: soft, non distended  SKIN: erythematous lesions on bl LE, mostly flat   Assessment:   1. Allergic urticaria  Plan/Recommendations:  Urticaria (Hives): - At this time etiology of hives is unknown. Hives can be caused by a variety of different triggers including illness/infection, insect bites, stress, pressure, vibrations, extremes of temperature to name a few however majority of the time there is no identifiable trigger. Some concern for urticarial vasculitis based on symptoms of persistent non pruritic urticarial rash that was non responsive to anti histamines and steroids.  Dermatology biopsy showed perivascular dermatitis with Eos concern for urticaria, bug bites, viral exanthem- ANCA/ANA negative.  -Continue triamcinolone  0.1% twice daily for 1-2 weeks. Followed by Dermatology -If this recurs, take Zyrtec  10mg  twice daily and Pepcid  20mg  twice daily.   Hold all anti-histamines (Xyzal, Allegra, Zyrtec , Claritin, Benadryl, Pepcid ) 3 days prior to next visit.   Follow up: 8/22 at [facility] for skin testing 1-55, shellfish mix and individuals   Arleta Blanch, MD [facility] 

## 2023-12-04 ENCOUNTER — Other Ambulatory Visit: Payer: Self-pay | Admitting: Family

## 2023-12-04 DIAGNOSIS — E1129 Type 2 diabetes mellitus with other diabetic kidney complication: Secondary | ICD-10-CM

## 2023-12-06 ENCOUNTER — Ambulatory Visit: Admitting: Internal Medicine

## 2023-12-06 DIAGNOSIS — L500 Allergic urticaria: Secondary | ICD-10-CM | POA: Diagnosis not present

## 2023-12-06 MED ORDER — FAMOTIDINE 20 MG PO TABS: 20.0000 mg | ORAL_TABLET | Freq: Two times a day (BID) | ORAL | 5 refills | Status: AC | PRN

## 2023-12-06 MED ORDER — CETIRIZINE HCL 10 MG PO TABS: 10.0000 mg | ORAL_TABLET | Freq: Two times a day (BID) | ORAL | 5 refills | Status: AC | PRN

## 2023-12-06 NOTE — Progress Notes (Signed)
 FOLLOW UP Date of Service/Encounter:  12/06/23   Subjective:  Katherine Kelly (DOB: December 23, 1955) is a 68 y.o. female who [facility] for follow up for skin testing.   History obtained from: chart review and patient and husband.  Anti histamines held.   Past Medical History: Past Medical History:  Diagnosis Date   ACE-inhibitor cough    ALLERGIC RHINITIS 01/03/2009   Qualifier: Diagnosis of  By: Georgian ROSALEA CHARM Lamar    ALLERGIC RHINITIS 01/03/2009   Qualifier: Diagnosis of   By: Georgian ROSALEA CHARM Lamar     Replacing diagnoses that were inactivated after the 07/16/22 regulatory import     Annual physical exam 12/22/2010   Aortic valve stenosis 08/30/2020   Arthritis    hands   ASCUS of cervix with negative high risk HPV 03/20/2017   11/2016  Per guidelines, repeat cytology and HPV testing 3 years.     Cataract, bilateral    COVID-19 vaccine series declined 11/01/2020   Depression 08/09/2014   Diabetes mellitus, type 2 (HCC) 09/2006   on meds   Diabetes type 2, controlled (HCC) 11/27/2006   Qualifier: Diagnosis of  By: Kassie MD, Sean A    Diabetic neuropathy (HCC) 08/31/2013   on meds   Dyslipidemia 10/07/2020   Eczema 09/14/2014   Essential hypertension 11/27/2006   Qualifier: Diagnosis of  By: Kassie MD, Alyce A    History of colonic polyps    History of motion sickness 02/02/2022   Hypertension    on meds   HYPERTRIGLYCERIDEMIA 02/18/2007   Qualifier: Diagnosis of   By: Georgian ROSALEA CHARM Lamar        Osteoporosis    RHINOSINUSITIS, CHRONIC 07/07/2009   Qualifier: Diagnosis of  By: Georgian ROSALEA CHARM Lamar     Objective:  There were no vitals taken for this visit. There is no height or weight on file to calculate BMI. Physical Exam: GEN: alert, well developed HEENT: clear conjunctiva, MMM LUNGS: unlabored respiration ` Skin Testing:  Skin prick testing was placed, which includes aeroallergens/foods, histamine control, and saline control.   Verbal consent was obtained prior to placing test.  Patient tolerated procedure well.  Allergy  testing results were read and interpreted by myself, documented by clinical staff. Adequate positive and negative control.  Positive results to:  Results discussed with patient/family.  Airborne Adult Perc - 12/06/23 1400     Time Antigen Placed 1414    Allergen Manufacturer Jestine    Location Back    Number of Test 55    1. Control-Buffer 50% Glycerol Negative    2. Control-Histamine 3+    3. Bahia Negative    4. French Southern Territories Negative    5. Johnson Negative    6. [place]  Blue Negative    7. Meadow Fescue Negative    8. Perennial Rye Negative    9. Timothy Negative    10. Ragweed Mix Negative    11. Cocklebur Negative    12. Plantain,  English 3+    13. Baccharis Negative    14. Dog Fennel Negative    15. Russian Thistle 3+    16. Lamb's Quarters Negative    17. Sheep Sorrell Negative    18. Rough Pigweed 3+    19. Marsh Elder, Rough Negative    20. Mugwort, Common Negative    21. Box, Elder Negative    22. Cedar, red Negative    23. Sweet Gum 3+  24. Pecan Pollen Negative    25. Pine Mix Negative    26. Walnut, Black Pollen 3+    27. Red Mulberry 3+    28. Ash Mix Negative    29. Birch Mix 3+    30. Beech American 2+    31. Cottonwood, [place] 2+    32. Hickory, White 3+    33. Maple Mix Negative    34. Oak, [place] Mix 3+    35. Sycamore Eastern Negative    36. Alternaria Alternata Negative    37. Cladosporium Herbarum Negative    38. Aspergillus Mix Negative    39. Penicillium Mix Negative    40. Bipolaris Sorokiniana (Helminthosporium) Negative    41. Drechslera Spicifera (Curvularia) Negative    42. Mucor Plumbeus Negative    43. Fusarium Moniliforme Negative    44. Aureobasidium Pullulans (pullulara) Negative    45. Rhizopus Oryzae Negative    46. Botrytis Cinera Negative    47. Epicoccum Nigrum Negative    48. Phoma Betae Negative    49. Dust Mite Mix Negative     50. Cat Hair 10,000 BAU/ml Negative    51.  Dog Epithelia Negative    52. Mixed Feathers Negative    53. Horse Epithelia Negative    54. Cockroach, German Negative    55. Tobacco Leaf Negative          13 Food Perc - 12/06/23 1400       Test Information   Time Antigen Placed 1414    Allergen Manufacturer Jestine    Location Back    Number of allergen test 6          Food Adult Perc - 12/06/23 1400     Time Antigen Placed 1416    Allergen Manufacturer Jestine    Location Back    Number of allergen test 6    8. Shellfish Mix Negative    23. Shrimp Negative    24. Crab Negative    25. Lobster Negative    26. Oyster Negative    27. Scallops Negative           Assessment:   1. Allergic urticaria     Plan/Recommendations:   Urticaria (Hives): - SPT 11/2023: negative for shellfish; positive for pollens- grasses, trees, weeds. No food restrictions.  - At this time etiology of hives and swelling is unknown. Hives can be caused by a variety of different triggers including illness/infection, insect bites, stress, pressure, vibrations, extremes of temperature to name a few however majority of the time there is no identifiable trigger.  -Continue triamcinolone  0.1% twice daily for 1-2 weeks. Followed by Dermatology. -If this recurs, take Zyrtec  10mg  twice daily and Pepcid  20mg  twice daily.   Return in about 6 months (around 06/07/2024).  Arleta Blanch, MD [facility] 

## 2023-12-06 NOTE — Patient Instructions (Addendum)
 Urticaria (Hives): - SPT 11/2023: negative for shellfish; positive for pollens- grasses, trees, weeds  - At this time etiology of hives and swelling is unknown. Hives can be caused by a variety of different triggers including illness/infection, insect bites, stress, pressure, vibrations, extremes of temperature to name a few however majority of the time there is no identifiable trigger.  -Continue triamcinolone  0.1% twice daily for 1-2 weeks. Followed by Dermatology. -If this recurs, take Zyrtec  10mg  twice daily and Pepcid  20mg  twice daily.

## 2023-12-12 ENCOUNTER — Telehealth: Payer: Self-pay

## 2023-12-12 NOTE — Progress Notes (Signed)
 Complex Care Management Note  Care Guide Note 12/12/2023 Name: CHENEE MUNNS MRN: [id number] DOB: May 31, 1955  TRIVIA HEFFELFINGER is a 68 y.o. year old female who sees Daryl Setter, NP for primary care. I reached out to Bonnita D Reynolds by phone today to offer complex care management services.  Ms. Slager was given information about Complex Care Management services today including:   The Complex Care Management services include support from the care team which includes your Nurse Care Manager, Clinical Social Worker, or Pharmacist.  The Complex Care Management team is here to help remove barriers to the health concerns and goals most important to you. Complex Care Management services are voluntary, and the patient may decline or stop services at any time by request to their care team member.   Complex Care Management Consent Status: Patient agreed to services and verbal consent obtained.   Follow up plan:  Telephone appointment with complex care management team member scheduled for:  12/25/23 at 2:00 p.m.   Encounter Outcome:  Patient Scheduled  Dreama [facility]  [facility], [facility] VBCI Assistant Direct Dial: [phone]  Fax: [phone]

## 2023-12-13 ENCOUNTER — Encounter: Payer: Self-pay | Admitting: Family

## 2023-12-13 ENCOUNTER — Other Ambulatory Visit: Payer: Self-pay

## 2023-12-13 DIAGNOSIS — E1129 Type 2 diabetes mellitus with other diabetic kidney complication: Secondary | ICD-10-CM

## 2023-12-25 ENCOUNTER — Other Ambulatory Visit

## 2023-12-30 ENCOUNTER — Telehealth: Payer: Self-pay

## 2023-12-30 ENCOUNTER — Encounter: Payer: Self-pay | Admitting: Pharmacist

## 2023-12-30 NOTE — Progress Notes (Signed)
 Complex Care Management Note  Care Guide Note 12/30/2023 Name: Katherine Kelly MRN: [id number] DOB: 1955/11/18  Katherine Kelly is a 68 y.o. year old female who sees Daryl Setter, NP for primary care. I reached out to Yazmyne D Harbach by phone today to offer complex care management services.  [name] was given information about Complex Care Management services today including:   The Complex Care Management services include support from the care team which includes your Nurse Care Manager, Clinical Social Worker, or Pharmacist.  The Complex Care Management team is here to help remove barriers to the health concerns and goals most important to you. Complex Care Management services are voluntary, and the patient may decline or stop services at any time by request to their care team member.   Complex Care Management Consent Status: Patient did not agree to participate in complex care management services at this time.  Follow up plan:  Daughter plans to have patient follow up with PCP.  Encounter Outcome:  Patient Refused  Dreama Agent [facility], [facility] VBCI Assistant Direct Dial: [phone]  Fax: [phone]

## 2023-12-30 NOTE — Progress Notes (Signed)
   12/30/2023 Name: Katherine Kelly MRN: [id number] DOB: November 12, 1955  Chief Complaint  Patient presents with   Medication Management    Januvia  cost    Katherine Kelly is a 68 y.o. year old female who was referred to the pharmacist by their PCP for assistance in managing medication access  Patient was initially scheduled with Clinical Pharmacist Practitioner 12/25/2023 to review medication cost for Januvia  and to see if she would qualify for assistance with medications cost. However appointment was cancelled. Per her daughter, Katherine Kelly's blood glucose has improved and she do   Subjective:   Medication Access/Adherence  Current Pharmacy:  [facility] [address] [place] - [address] [place] [address] Phone: [phone] Fax: [phone]   Patient reports affordability concerns with their medications: Yes  - did not start Januvia  due to cost.     Assessment/Plan:  Medication Assess  -cost of Januvia  was too high.  - Reviewed her [facility] plan. Patient has a $255 deductible to meet for any tier 3 or higher medications / preferred brands. The cost would be $47 per 30 days. Patient's initial cost would be $302 for first refill and then would be $47 thereafter thru 03/2024.  - She might qualify for LIS or patient assistance program but patient declined appointment. Will forward cost information to PCP and to patient thru MyChart message.    Katherine Kelly, PharmD Clinical Pharmacist [facility]

## 2024-02-24 ENCOUNTER — Other Ambulatory Visit: Payer: Self-pay | Admitting: Family

## 2024-02-24 DIAGNOSIS — Z780 Asymptomatic menopausal state: Secondary | ICD-10-CM

## 2024-02-25 ENCOUNTER — Other Ambulatory Visit: Payer: Self-pay | Admitting: Family

## 2024-02-25 DIAGNOSIS — E1129 Type 2 diabetes mellitus with other diabetic kidney complication: Secondary | ICD-10-CM

## 2024-03-04 ENCOUNTER — Other Ambulatory Visit: Payer: Self-pay | Admitting: Family

## 2024-03-04 DIAGNOSIS — R809 Proteinuria, unspecified: Secondary | ICD-10-CM

## 2024-03-16 ENCOUNTER — Telehealth: Payer: Self-pay

## 2024-03-16 NOTE — Telephone Encounter (Signed)
 Initial Comment [name] with a sore throat. Translation No Nurse Assessment Nurse: Mertha, RN, Earnie Date/Time Titus Time): 03/13/2024 12:44:40 PM Confirm and document reason for call. If symptomatic, describe symptoms. ---Caller states his wife has a cough and sore throat. States she is congested with runny nose. Cold medicines don't help. Afebrile. Does the patient have any new or worsening symptoms? ---Yes Will a triage be completed? ---Yes Related visit to physician within the last 2 weeks? ---Yes Does the PT have any chronic conditions? (i.e. diabetes, asthma, this includes High risk factors for pregnancy, etc.) ---No Is this a behavioral health or substance abuse call? ---No Guidelines Guideline Title Affirmed Question Affirmed Notes Nurse Date/Time Titus Time) Cough - Acute Productive Wheezing is present Mertha OBIE Earnie 03/13/2024 12:46:49 PM Disp. Time Titus Time) Disposition Final User 03/13/2024 12:30:49 PM Send to Clinical Louis Muzzy, RN, Dorothyann 03/13/2024 12:49:23 PM See HCP (or PCP Triage) Within 4 Hours Yes Mertha, RN, Earnie PLEASE NOTE: All timestamps contained within this report are represented as Eastern Standard Time. CONFIDENTIALTY NOTICE: This fax transmission is intended only for the addressee. It contains information that is legally privileged, confidential or otherwise protected from use or disclosure. If you are not the intended recipient, you are strictly prohibited from reviewing, disclosing, copying using or disseminating any of this information or taking any action in reliance on or regarding this information. If you have received this fax in error, please notify us  immediately by telephone so that we can arrange for its return to us . Phone: [phone], Toll-Free: [phone], Fax: [phone] [email or web address] [birth year] Page: 1 of2 CallId: [id number] Final Disposition 03/13/2024 12:49:23 PM See HCP (or  PCP Triage) Within 4 Hours Yes Mertha, RN, Earnie Flint Disagree/Comply Comply Caller Understands Yes PreDisposition Did not know what to do Care Advice Given Per Guideline SEE HCP (OR PCP TRIAGE) WITHIN 4 HOURS: * IF OFFICE WILL BE OPEN: You need to be seen within the next 3 or 4 hours. Call your doctor (or NP/PA) now or as soon as the office opens. CARE ADVICE given per Cough - Acute Productive (Adult) guideline. CALL BACK IF: * You become worse Referrals [facility] at [facility]

## 2024-03-16 NOTE — Telephone Encounter (Signed)
 Initial Comment [name] and thinks she might have strep. Pt. wants to make an appt. Patient request to speak to RN No Additional Comment Office hours given. Translation No Disp. Time Titus Time) Disposition Final User 03/13/2024 9:18:04 AM General Information Provided Yes Katherine Kelly Final Disposition 03/13/2024 9:18:04 AM General Information Provided Yes Katherine Kelly

## 2024-03-16 NOTE — Telephone Encounter (Signed)
 Called to offer appointment, per patient's daughter, patient is feeling better and will not need to be seen

## 2024-04-16 ENCOUNTER — Other Ambulatory Visit: Payer: Self-pay | Admitting: Family

## 2024-04-16 DIAGNOSIS — E785 Hyperlipidemia, unspecified: Secondary | ICD-10-CM

## 2024-06-05 ENCOUNTER — Ambulatory Visit: Admitting: Family
